# Patient Record
Sex: Male | Born: 1940
Health system: Southern US, Community
[De-identification: ages and names within clinical notes are randomized; demographics above are authoritative.]

## PROBLEM LIST (undated history)

## (undated) DIAGNOSIS — L570 Actinic keratosis: Secondary | ICD-10-CM

## (undated) DIAGNOSIS — E785 Hyperlipidemia, unspecified: Secondary | ICD-10-CM

## (undated) DIAGNOSIS — H269 Unspecified cataract: Secondary | ICD-10-CM

## (undated) DIAGNOSIS — R011 Cardiac murmur, unspecified: Secondary | ICD-10-CM

## (undated) DIAGNOSIS — I493 Ventricular premature depolarization: Secondary | ICD-10-CM

## (undated) DIAGNOSIS — M1990 Unspecified osteoarthritis, unspecified site: Secondary | ICD-10-CM

## (undated) DIAGNOSIS — I10 Essential (primary) hypertension: Secondary | ICD-10-CM

## (undated) DIAGNOSIS — H409 Unspecified glaucoma: Secondary | ICD-10-CM

## (undated) DIAGNOSIS — C4491 Basal cell carcinoma of skin, unspecified: Secondary | ICD-10-CM

## (undated) HISTORY — DX: Unspecified glaucoma: H40.9

## (undated) HISTORY — DX: Actinic keratosis: L57.0

## (undated) HISTORY — DX: Basal cell carcinoma of skin, unspecified: C44.91

## (undated) HISTORY — DX: Hyperlipidemia, unspecified: E78.5

## (undated) HISTORY — DX: Cardiac murmur, unspecified: R01.1

## (undated) HISTORY — DX: Unspecified osteoarthritis, unspecified site: M19.90

## (undated) HISTORY — PX: HERNIA REPAIR: SHX51

## (undated) HISTORY — PX: ROTATOR CUFF REPAIR: SHX139

## (undated) HISTORY — DX: Unspecified cataract: H26.9

---

## 2009-08-13 DIAGNOSIS — D239 Other benign neoplasm of skin, unspecified: Secondary | ICD-10-CM

## 2009-08-13 HISTORY — DX: Other benign neoplasm of skin, unspecified: D23.9

## 2012-06-18 ENCOUNTER — Emergency Department (HOSPITAL_COMMUNITY)
Admission: EM | Admit: 2012-06-18 | Discharge: 2012-06-18 | Disposition: A | Discharge status: Home / Self Care | Payer: Medicare Other | Attending: Emergency Medicine | Admitting: Emergency Medicine

## 2012-06-18 ENCOUNTER — Encounter (HOSPITAL_COMMUNITY): Payer: Self-pay | Admitting: *Deleted

## 2012-06-18 DIAGNOSIS — I10 Essential (primary) hypertension: Secondary | ICD-10-CM | POA: Insufficient documentation

## 2012-06-18 DIAGNOSIS — W261XXA Contact with sword or dagger, initial encounter: Secondary | ICD-10-CM | POA: Insufficient documentation

## 2012-06-18 DIAGNOSIS — S61209A Unspecified open wound of unspecified finger without damage to nail, initial encounter: Secondary | ICD-10-CM | POA: Insufficient documentation

## 2012-06-18 DIAGNOSIS — W260XXA Contact with knife, initial encounter: Secondary | ICD-10-CM | POA: Insufficient documentation

## 2012-06-18 DIAGNOSIS — IMO0002 Reserved for concepts with insufficient information to code with codable children: Secondary | ICD-10-CM

## 2012-06-18 DIAGNOSIS — E119 Type 2 diabetes mellitus without complications: Secondary | ICD-10-CM | POA: Insufficient documentation

## 2012-06-18 DIAGNOSIS — Z23 Encounter for immunization: Secondary | ICD-10-CM | POA: Insufficient documentation

## 2012-06-18 DIAGNOSIS — Z7982 Long term (current) use of aspirin: Secondary | ICD-10-CM | POA: Insufficient documentation

## 2012-06-18 HISTORY — DX: Ventricular premature depolarization: I49.3

## 2012-06-18 HISTORY — DX: Essential (primary) hypertension: I10

## 2012-06-18 MED ORDER — CEPHALEXIN 500 MG PO CAPS: 1000.0000 mg | ORAL_CAPSULE | Freq: Two times a day (BID) | ORAL | Status: DC

## 2012-06-18 MED ORDER — TETANUS-DIPHTH-ACELL PERTUSSIS 5-2.5-18.5 LF-MCG/0.5 IM SUSP
0.5000 mL | Freq: Once | INTRAMUSCULAR | Status: AC
  Administered 2012-06-18: 0.5 mL via INTRAMUSCULAR
  Filled 2012-06-18: qty 0.5

## 2012-06-18 NOTE — ED Notes (Signed)
Pt was opening a curtain rod w/ a pocket knife when he sliced his L thumb, pt states is about 1/2 inch long, states blood was squirting in the air, has been holding pressure on it since, bleeding controlled w/ pressure at this time.

## 2012-06-19 NOTE — ED Provider Notes (Signed)
Medical screening examination/treatment/procedure(s) were performed by non-physician practitioner and as supervising physician I was immediately available for consultation/collaboration.   Celene Kras, MD 06/19/12 [phone]

## 2012-06-19 NOTE — ED Provider Notes (Signed)
History     CSN: [id number]  Arrival date & time 06/18/12  4098   First MD Initiated Contact with Patient 06/18/12 1050      No chief complaint on file.   (Consider location/radiation/quality/duration/timing/severity/associated sxs/prior treatment) HPI  71 y.o. diabetic male INAD c/o laceration to left thumb approximately 2 hours ago after cutting it with a pocket knife. Bleeding is controlled and pain is minimal. Last tetanus unknown.  Past Medical History  Diagnosis Date  . Diabetes mellitus   . PVC (premature ventricular contraction)   . Hypertension     Past Surgical History  Procedure Date  . Hernia repair   . Rotator cuff repair     History reviewed. No pertinent family history.  History  Substance Use Topics  . Smoking status: Never Smoker   . Smokeless tobacco: Former Neurosurgeon  . Alcohol Use: Yes      Review of Systems  Constitutional: Negative for fever.  Respiratory: Negative for shortness of breath.   Cardiovascular: Negative for chest pain.  Gastrointestinal: Negative for nausea, vomiting, abdominal pain and diarrhea.  Skin: Positive for wound.  All other systems reviewed and are negative.    Allergies  Review of patient's allergies indicates no known allergies.  Home Medications   Current Outpatient Rx  Name Route Sig Dispense Refill  . ASPIRIN 325 MG PO TABS Oral Take 325 mg by mouth daily.    Marland Kitchen CINNAMON PO Oral Take 1 capsule by mouth daily.    Marland Kitchen GARLIC PO Oral Take 1 capsule by mouth daily.    Marland Kitchen NIACIN 500 MG PO TABS Oral Take 500 mg by mouth daily with breakfast.    . OLMESARTAN MEDOXOMIL 40 MG PO TABS Oral Take 40 mg by mouth daily.    Marland Kitchen TIMOLOL MALEATE 10 MG PO TABS Oral Take 10 mg by mouth daily.    . CEPHALEXIN 500 MG PO CAPS Oral Take 2 capsules (1,000 mg total) by mouth 2 (two) times daily. 28 capsule 0    BP 137/76  Pulse 49  Temp 97.8 F (36.6 C) (Oral)  Resp 16  SpO2 100%  Physical Exam  Nursing note and vitals  reviewed. Constitutional: He is oriented to person, place, and time. He appears well-developed and well-nourished. No distress.  HENT:  Head: Normocephalic.  Eyes: Conjunctivae normal and EOM are normal.  Cardiovascular: Normal rate.   Pulmonary/Chest: Effort normal. No stridor.  Abdominal: Soft.  Musculoskeletal: Normal range of motion.  Neurological: He is alert and oriented to person, place, and time.  Skin:       3 cm full-thickness laceration to distal phalanx of left thumb. Wound is clean bleeding is controlled. No joint involvement. Full range of motion and distal sensation is intact.  Psychiatric: He has a normal mood and affect.    ED Course  Procedures (including critical care time)  LACERATION REPAIR Performed by: Wynetta Emery Authorized by: Wynetta Emery Consent: Verbal consent obtained. Risks and benefits: risks, benefits and alternatives were discussed Consent given by: patient Patient identity confirmed: provided demographic data Prepped and Draped in normal sterile fashion Wound explored  Laceration Location: left thumb distal phalynx  Laceration Length: 3cm  No Foreign Bodies seen or palpated  Anesthesia: digital block  Local anesthetic: lidocaine 2% without epinephrine  Anesthetic total: 4 ml  Irrigation method: syringe Amount of cleaning: standard  Skin closure: 4-0 polypropylene  Number of sutures: 3  Technique: simple interupted  Patient tolerance: Patient tolerated the procedure well with  no immediate complications.  Labs Reviewed - No data to display No results found.   1. Laceration       MDM  Wound closed with 3x simple interrupted sutures. AS he is diabetic, Abx will be given.   Pt verbalized understanding and agrees with care plan. Outpatient follow-up and return precautions given.     New Prescriptions   CEPHALEXIN (KEFLEX) 500 MG CAPSULE    Take 2 capsules (1,000 mg total) by mouth 2 (two) times daily.           Wynetta Emery, PA-C 06/19/12 [phone]

## 2019-02-06 ENCOUNTER — Other Ambulatory Visit: Payer: Self-pay

## 2019-02-06 ENCOUNTER — Encounter: Payer: Self-pay | Admitting: Physician Assistant

## 2019-02-06 ENCOUNTER — Ambulatory Visit (INDEPENDENT_AMBULATORY_CARE_PROVIDER_SITE_OTHER): Payer: Medicare Other | Admitting: Physician Assistant

## 2019-02-06 VITALS — BP 155/88 | HR 55 | Temp 98.1°F | Resp 16 | Ht 70.0 in | Wt 200.4 lb

## 2019-02-06 DIAGNOSIS — H409 Unspecified glaucoma: Secondary | ICD-10-CM | POA: Insufficient documentation

## 2019-02-06 DIAGNOSIS — I493 Ventricular premature depolarization: Secondary | ICD-10-CM

## 2019-02-06 DIAGNOSIS — R011 Cardiac murmur, unspecified: Secondary | ICD-10-CM | POA: Insufficient documentation

## 2019-02-06 DIAGNOSIS — Z87442 Personal history of urinary calculi: Secondary | ICD-10-CM | POA: Insufficient documentation

## 2019-02-06 DIAGNOSIS — E1136 Type 2 diabetes mellitus with diabetic cataract: Secondary | ICD-10-CM | POA: Diagnosis not present

## 2019-02-06 DIAGNOSIS — E1169 Type 2 diabetes mellitus with other specified complication: Secondary | ICD-10-CM | POA: Insufficient documentation

## 2019-02-06 DIAGNOSIS — I152 Hypertension secondary to endocrine disorders: Secondary | ICD-10-CM | POA: Insufficient documentation

## 2019-02-06 DIAGNOSIS — I10 Essential (primary) hypertension: Secondary | ICD-10-CM | POA: Diagnosis not present

## 2019-02-06 DIAGNOSIS — E119 Type 2 diabetes mellitus without complications: Secondary | ICD-10-CM | POA: Insufficient documentation

## 2019-02-06 NOTE — Progress Notes (Signed)
Patient: Joseph Cook, Male    DOB: 05-11-1941, 78 y.o.   MRN: [id number] Visit Date: 02/06/2019  Today's Provider: Mar Daring, PA-C   Chief Complaint  Patient presents with  . New Patient (Initial Visit)   Subjective:     Establish Care: Joseph Cook is a 78 y.o. male who presents today to establish care. He recently moved here from [place], [place]. He was previously seen at [facility], Maylene Roes, [place].  He reports he is overall healthy. He does have history of HTN and PVCs. He is currently on Metoprolol 50mg  ER daily for PVCs and Lisinopril-HCTZ 20-25mg  for HTN and T2DM. He is diabetic and reports his last A1c was 6.1, 6 months ago. He takes metformin 500mg  daily. He is also on Atorvastatin 10mg  daily.   He reports 2-3 years ago he had kidney stones, requiring lithotripsy. He was placed on tamsulosin 0.4mg  then and has continued due to helping with urination.   There is strong family history of early CAD (multiple relations passed in 76s and 21s, father passed at 35 from MI) on his paternal side.  He does have h/o cataracts and has had surgery for removal. He does report having glaucoma but is stable on OTC eye drops. He is due for annual eye examination in February 2021 and will need to establish locally.  -----------------------------------------------------------------   Review of Systems  Constitutional: Negative.   HENT: Negative.   Eyes: Negative.   Respiratory: Negative.   Cardiovascular: Positive for leg swelling.       PVC'S  Gastrointestinal: Negative.   Endocrine: Negative.   Genitourinary: Negative.   Musculoskeletal: Positive for arthralgias.  Skin: Negative.   Allergic/Immunologic: Negative.   Neurological: Negative.   Hematological: Negative.   Psychiatric/Behavioral: Negative.     Social History      He  reports that he has never smoked. He has quit using smokeless tobacco. He reports current alcohol use. He  reports that he does not use drugs.       Social History   Socioeconomic History  . Marital status: Married    Spouse name: Not on file  . Number of children: Not on file  . Years of education: Not on file  . Highest education level: Not on file  Occupational History  . Not on file  Social Needs  . Financial resource strain: Not on file  . Food insecurity:    Worry: Not on file    Inability: Not on file  . Transportation needs:    Medical: Not on file    Non-medical: Not on file  Tobacco Use  . Smoking status: Never Smoker  . Smokeless tobacco: Former Network engineer and Sexual Activity  . Alcohol use: Yes  . Drug use: No  . Sexual activity: Not on file  Lifestyle  . Physical activity:    Days per week: Not on file    Minutes per session: Not on file  . Stress: Not on file  Relationships  . Social connections:    Talks on phone: Not on file    Gets together: Not on file    Attends religious service: Not on file    Active member of club or organization: Not on file    Attends meetings of clubs or organizations: Not on file    Relationship status: Not on file  Other Topics Concern  . Not on file  Social History Narrative  . Not  on file    Past Medical History:  Diagnosis Date  . Cataract   . Diabetes mellitus   . Glaucoma   . Hypertension   . PVC (premature ventricular contraction)      There are no active problems to display for this patient.   Past Surgical History:  Procedure Laterality Date  . HERNIA REPAIR    . ROTATOR CUFF REPAIR      Family History        Family Status  Relation Name Status  . Mother  (Not Specified)  . Father  (Not Specified)  . Mat Aunt  (Not Specified)  . Mat Uncle  (Not Specified)  . MGM  (Not Specified)  . PGM  (Not Specified)        His family history includes Alzheimer's disease in his maternal aunt; Cancer in his maternal grandmother and paternal grandmother; Heart attack in his father and maternal uncle; Stroke  in his mother.      No Known Allergies   Current Outpatient Medications:  .  aspirin 325 MG tablet, Take 325 mg by mouth daily., Disp: , Rfl:  .  atorvastatin (LIPITOR) 10 MG tablet, Take 10 mg by mouth at bedtime., Disp: , Rfl:  .  Cholecalciferol (VITAMIN D3) 50 MCG (2000 UT) TABS, Take by mouth., Disp: , Rfl:  .  lisinopril-hydrochlorothiazide (ZESTORETIC) 20-25 MG tablet, Take 1 tablet by mouth daily., Disp: , Rfl:  .  metFORMIN (GLUCOPHAGE) 500 MG tablet, Take 500 mg by mouth daily., Disp: , Rfl:  .  METOPROLOL SUCCINATE ER PO, Take 50 mg by mouth daily., Disp: , Rfl:  .  Multiple Vitamins-Minerals (PRESERVISION AREDS 2 PO), Take by mouth., Disp: , Rfl:  .  tamsulosin (FLOMAX) 0.4 MG CAPS capsule, Take 0.4 mg by mouth daily., Disp: , Rfl:    Patient Care Team: Rubye Beach as PCP - General (Family Medicine)    Objective:    Vitals: BP (!) 155/88 (BP Location: Left Arm, Patient Position: Sitting, Cuff Size: Large)   Pulse (!) 55   Temp 98.1 F (36.7 C) (Oral)   Resp 16   Ht 5\' 10"  (1.778 m)   Wt 200 lb 6.4 oz (90.9 kg)   BMI 28.75 kg/m    Vitals:   02/06/19 0921  BP: (!) 155/88  Pulse: (!) 55  Resp: 16  Temp: 98.1 F (36.7 C)  TempSrc: Oral  Weight: 200 lb 6.4 oz (90.9 kg)  Height: 5\' 10"  (1.778 m)     Physical Exam Vitals signs reviewed.  Constitutional:      General: He is not in acute distress.    Appearance: Normal appearance. He is well-developed. He is obese. He is not ill-appearing or diaphoretic.  HENT:     Head: Normocephalic and atraumatic.  Eyes:     General: No scleral icterus.    Extraocular Movements: Extraocular movements intact.  Neck:     Musculoskeletal: Normal range of motion and neck supple.  Cardiovascular:     Rate and Rhythm: Normal rate and regular rhythm.     Pulses: Normal pulses.     Heart sounds: Murmur present. Systolic (heard best over R 2nd ICS) murmur present with a grade of 2/6. No friction rub. No gallop.    Pulmonary:     Effort: Pulmonary effort is normal. No respiratory distress.     Breath sounds: Normal breath sounds. No wheezing or rales.  Neurological:     Mental Status: He is alert.  Depression Screen PHQ 2/9 Scores 02/06/2019  PHQ - 2 Score 0       Assessment & Plan:     Routine Health Maintenance and Physical Exam  Exercise Activities and Dietary recommendations Goals   None     Immunization History  Administered Date(s) Administered  . Tdap 06/18/2012    Health Maintenance  Topic Date Due  . PNA vac Low Risk Adult (1 of 2 - PCV13) 08/06/2006  . INFLUENZA VACCINE  04/26/2019  . TETANUS/TDAP  06/18/2022     Discussed health benefits of physical activity, and encouraged him to engage in regular exercise appropriate for his age and condition.    1. Essential hypertension Stable. Continue current medical treatment plan of lisinopril-hctz 20-25mg  and metoprolol XR 50mg . Will check labs as below and f/u pending results. - CBC with Differential/Platelet - Comprehensive metabolic panel - Hemoglobin A1c - Lipid panel - TSH  2. PVC (premature ventricular contraction) Stable. Has not felt much since being on metoprolol. Will check labs as below and f/u pending results. - CBC with Differential/Platelet - Comprehensive metabolic panel  3. Type 2 diabetes mellitus with diabetic cataract, without long-term current use of insulin (HCC) Stable. Continue metformin 500mg  daily. Will check labs as below and f/u pending results. - CBC with Differential/Platelet - Comprehensive metabolic panel - Hemoglobin A1c - Lipid panel  4. Glaucoma of both eyes, unspecified glaucoma type Continue OTC eye drops. Due for annual eye exam in February 2021.   5. History of kidney stones No issue in last couple years. Takes Tamsulosin 0.4mg  daily.   6. Cardiac murmur Heard today. Suspect aortic stenosis. No previous echocardiogram. No local cardiologist. Patient desires to  monitor for progression or symptoms. Currently denies any symptoms of SOB, DOE, chest pain, abnormal leg swelling.   --------------------------------------------------------------------    Mar Daring, PA-C  [facility]

## 2019-02-06 NOTE — Patient Instructions (Signed)
Type 2 Diabetes Mellitus, Self Care, Adult When you have type 2 diabetes (type 2 diabetes mellitus), you must make sure your blood sugar (glucose) stays in a healthy range. You can do this with:  Nutrition.  Exercise.  Lifestyle changes.  Medicines or insulin, if needed.  Support from your doctors and others. How to stay aware of blood sugar   Check your blood sugar level every day, as often as told.  Have your A1c (hemoglobin A1c) level checked two or more times a year. Have it checked more often if your doctor tells you to. Your doctor will set personal treatment goals for you. Generally, you should have these blood sugar levels:  Before meals (preprandial): 80-130 mg/dL (4.4-7.2 mmol/L).  After meals (postprandial): below 180 mg/dL (10 mmol/L).  A1c level: less than 7%. How to manage high and low blood sugar Signs of high blood sugar High blood sugar is called hyperglycemia. Know the signs of high blood sugar. Signs may include:  Feeling: ? Thirsty. ? Hungry. ? Very tired.  Needing to pee (urinate) more than usual.  Blurry vision. Signs of low blood sugar Low blood sugar is called hypoglycemia. This is when blood sugar is at or below 70 mg/dL (3.9 mmol/L). Signs may include:  Feeling: ? Hungry. ? Worried or nervous (anxious). ? Sweaty and clammy. ? Confused. ? Dizzy. ? Sleepy. ? Sick to your stomach (nauseous).  Having: ? A fast heartbeat. ? A headache. ? A change in your vision. ? Jerky movements that you cannot control (seizure). ? Tingling or no feeling (numbness) around your mouth, lips, or tongue.  Having trouble with: ? Moving (coordination). ? Sleeping. ? Passing out (fainting). ? Getting upset easily (irritability). Treating low blood sugar To treat low blood sugar, eat or drink something sugary right away. If you can think clearly and swallow safely, follow the 15:15 rule:  Take 15 grams of a fast-acting carb (carbohydrate). Talk with your  doctor about how much you should take.  Some fast-acting carbs are: ? Sugar tablets (glucose pills). Take 3-4 pills. ? 6-8 pieces of hard candy. ? 4-6 oz (120-150 mL) of fruit juice. ? 4-6 oz (120-150 mL) of regular (not diet) soda. ? 1 Tbsp (15 mL) honey or sugar.  Check your blood sugar 15 minutes after you take the carb.  If your blood sugar is still at or below 70 mg/dL (3.9 mmol/L), take 15 grams of a carb again.  If your blood sugar does not go above 70 mg/dL (3.9 mmol/L) after 3 tries, get help right away.  After your blood sugar goes back to normal, eat a meal or a snack within 1 hour. Treating very low blood sugar If your blood sugar is at or below 54 mg/dL (3 mmol/L), you have very low blood sugar (severe hypoglycemia). This is an emergency. Do not wait to see if the symptoms will go away. Get medical help right away. Call your local emergency services (911 in the [place] If you have very low blood sugar and you cannot eat or drink, you may need a glucagon shot (injection). A family member or friend should learn how to check your blood sugar and how to give you a glucagon shot. Ask your doctor if you need to have a glucagon shot kit at home. Follow these instructions at home: Medicine  Take insulin and diabetes medicines as told.  If your doctor says you should take more or less insulin and medicines, do this exactly as told.  Do not run out of insulin or medicines. Having diabetes can raise your risk for other long-term conditions. These include heart disease and kidney disease. Your doctor may prescribe medicines to help you not have these problems. Food   Make healthy food choices. These include: ? Chicken, fish, egg whites, and beans. ? Oats, whole wheat, bulgur, brown rice, quinoa, and millet. ? Fresh fruits and vegetables. ? Low-fat dairy products. ? Nuts, avocado, olive oil, and canola oil.  Meet with a food specialist (dietitian). He or she can help you make an  eating plan that is right for you.  Follow instructions from your doctor about what you cannot eat or drink.  Drink enough fluid to keep your pee (urine) pale yellow.  Keep track of carbs that you eat. Do this by reading food labels and learning food serving sizes.  Follow your sick day plan when you cannot eat or drink normally. Make this plan with your doctor so it is ready to use. Activity  Exercise 3 or more times a week.  Do not go more than 2 days without exercising.  Talk with your doctor before you start a new exercise. Your doctor may need to tell you to change: ? How much insulin or medicines you take. ? How much food you eat. Lifestyle  Do not use any tobacco products. These include cigarettes, chewing tobacco, and e-cigarettes. If you need help quitting, ask your doctor.  Ask your doctor how much alcohol is safe for you.  Learn to deal with stress. If you need help with this, ask your doctor. Body care   Stay up to date with your shots (immunizations).  Have your eyes and feet checked by a doctor as often as told.  Check your skin and feet every day. Check for cuts, bruises, redness, blisters, or sores.  Brush your teeth and gums two times a day. Floss one or more times a day.  Go to the dentist one or more times every 6 months.  Stay at a healthy weight. General instructions  Take over-the-counter and prescription medicines only as told by your doctor.  Share your diabetes care plan with: ? Your work or school. ? People you live with.  Carry a card or wear jewelry that says you have diabetes.  Keep all follow-up visits as told by your doctor. This is important. Questions to ask your doctor  Do I need to meet with a diabetes educator?  Where can I find a support group for people with diabetes? Where to find more information To learn more about diabetes, visit:  American Diabetes Association: www.diabetes.org  American Association of Diabetes  Educators: www.diabeteseducator.org Summary  When you have type 2 diabetes, you must make sure your blood sugar (glucose) stays in a healthy range.  Check your blood sugar every day, as often as told.  Having diabetes can raise your risk for other conditions. Your doctor may prescribe medicines to help you not have these problems.  Keep all follow-up visits as told by your doctor. This is important. This information is not intended to replace advice given to you by your health care provider. Make sure you discuss any questions you have with your health care provider. Document Released: 01/03/2016 Document Revised: 03/04/2018 Document Reviewed: 10/15/2015 Elsevier Interactive Patient Education  2019 Elsevier Inc.  

## 2019-02-07 ENCOUNTER — Telehealth: Payer: Self-pay

## 2019-02-07 LAB — LIPID PANEL
Chol/HDL Ratio: 3 ratio (ref 0.0–5.0)
Cholesterol, Total: 122 mg/dL (ref 100–199)
HDL: 41 mg/dL (ref >39)
LDL Calculated: 61 mg/dL (ref 0–99)
Triglycerides: 100 mg/dL (ref 0–149)
VLDL Cholesterol Cal: 20 mg/dL (ref 5–40)

## 2019-02-07 LAB — COMPREHENSIVE METABOLIC PANEL
ALT: 16 IU/L (ref 0–44)
AST: 14 IU/L (ref 0–40)
Albumin/Globulin Ratio: 2.5 — ABNORMAL HIGH (ref 1.2–2.2)
Albumin: 4.9 g/dL — ABNORMAL HIGH (ref 3.7–4.7)
Alkaline Phosphatase: 48 IU/L (ref 39–117)
BUN/Creatinine Ratio: 32 — ABNORMAL HIGH (ref 10–24)
BUN: 27 mg/dL (ref 8–27)
Bilirubin Total: 0.6 mg/dL (ref 0.0–1.2)
CO2: 20 mmol/L (ref 20–29)
Calcium: 9.9 mg/dL (ref 8.6–10.2)
Chloride: 102 mmol/L (ref 96–106)
Creatinine, Ser: 0.84 mg/dL (ref 0.76–1.27)
GFR calc Af Amer: 98 mL/min/{1.73_m2} (ref >59)
GFR calc non Af Amer: 84 mL/min/{1.73_m2} (ref >59)
Globulin, Total: 2 g/dL (ref 1.5–4.5)
Glucose: 130 mg/dL — ABNORMAL HIGH (ref 65–99)
Potassium: 4.1 mmol/L (ref 3.5–5.2)
Sodium: 141 mmol/L (ref 134–144)
Total Protein: 6.9 g/dL (ref 6.0–8.5)

## 2019-02-07 LAB — CBC WITH DIFFERENTIAL/PLATELET
Basophils Absolute: 0 10*3/uL (ref 0.0–0.2)
Basos: 0 %
EOS (ABSOLUTE): 0.2 10*3/uL (ref 0.0–0.4)
Eos: 3 %
Hematocrit: 43.1 % (ref 37.5–51.0)
Hemoglobin: 15.2 g/dL (ref 13.0–17.7)
Immature Grans (Abs): 0 10*3/uL (ref 0.0–0.1)
Immature Granulocytes: 0 %
Lymphocytes Absolute: 2.2 10*3/uL (ref 0.7–3.1)
Lymphs: 31 %
MCH: 30.6 pg (ref 26.6–33.0)
MCHC: 35.3 g/dL (ref 31.5–35.7)
MCV: 87 fL (ref 79–97)
Monocytes Absolute: 0.5 10*3/uL (ref 0.1–0.9)
Monocytes: 7 %
Neutrophils Absolute: 4.2 10*3/uL (ref 1.4–7.0)
Neutrophils: 59 %
Platelets: 173 10*3/uL (ref 150–450)
RBC: 4.96 x10E6/uL (ref 4.14–5.80)
RDW: 13.2 % (ref 11.6–15.4)
WBC: 7.1 10*3/uL (ref 3.4–10.8)

## 2019-02-07 LAB — HEMOGLOBIN A1C
Est. average glucose Bld gHb Est-mCnc: 131 mg/dL
Hgb A1c MFr Bld: 6.2 % — ABNORMAL HIGH (ref 4.8–5.6)

## 2019-02-07 LAB — TSH: TSH: 2.36 u[IU]/mL (ref 0.450–4.500)

## 2019-02-07 NOTE — Telephone Encounter (Signed)
Lab results given to patient.

## 2019-02-07 NOTE — Telephone Encounter (Signed)
-----   Message from Mar Daring, [place] sent at 02/07/2019  9:20 AM EDT ----- Blood count is normal. Kidney and liver function is normal. A1c is at 6.2. Cholesterol is normal. Thyroid is normal.

## 2019-04-02 NOTE — Progress Notes (Signed)
Patient: Joseph Cook Male    DOB: 01-Jul-1941   78 y.o.   MRN: [id number] Visit Date: 04/03/2019  Today's Provider: Mar Daring, PA-C   Chief Complaint  Patient presents with  . Back Pain    Left side   Subjective:     Back Pain This is a new problem. The current episode started in the past 7 days. The problem has been gradually improving since onset. The pain is present in the lumbar spine. Pertinent negatives include no abdominal pain, bladder incontinence, bowel incontinence, chest pain, dysuria, fever, headaches, leg pain, numbness, paresthesias, tingling or weakness.   Reports he was playing golf and felt his back start tightening up as he was playing and made difficult to walk the last 7-8 holes. Has been trying IBU without relief. Pain is in right lower back and radiates through the right gluteal region.   No Known Allergies   Current Outpatient Medications:  .  aspirin 325 MG tablet, Take 325 mg by mouth daily., Disp: , Rfl:  .  atorvastatin (LIPITOR) 10 MG tablet, Take 10 mg by mouth at bedtime., Disp: , Rfl:  .  Cholecalciferol (VITAMIN D3) 50 MCG (2000 UT) TABS, Take by mouth., Disp: , Rfl:  .  lisinopril-hydrochlorothiazide (ZESTORETIC) 20-25 MG tablet, Take 1 tablet by mouth daily., Disp: , Rfl:  .  metFORMIN (GLUCOPHAGE) 500 MG tablet, Take 500 mg by mouth daily., Disp: , Rfl:  .  METOPROLOL SUCCINATE ER PO, Take 50 mg by mouth daily., Disp: , Rfl:  .  Multiple Vitamins-Minerals (PRESERVISION AREDS 2 PO), Take by mouth., Disp: , Rfl:  .  tamsulosin (FLOMAX) 0.4 MG CAPS capsule, Take 0.4 mg by mouth daily., Disp: , Rfl:   Review of Systems  Constitutional: Negative.  Negative for appetite change, chills and fever.  Respiratory: Negative.  Negative for chest tightness, shortness of breath and wheezing.   Cardiovascular: Negative for chest pain and palpitations.  Gastrointestinal: Negative.  Negative for abdominal pain, bowel incontinence, nausea and  vomiting.  Genitourinary: Negative.  Negative for bladder incontinence and dysuria.  Musculoskeletal: Positive for back pain and myalgias. Negative for arthralgias, gait problem, joint swelling, neck pain and neck stiffness.       Pt complaining of left foot pain mostly at night.  Pt also states he has muscle pain on his right buttocks.    Neurological: Negative for dizziness, tingling, weakness, light-headedness, numbness, headaches and paresthesias.    Social History   Tobacco Use  . Smoking status: Never Smoker  . Smokeless tobacco: Former Network engineer Use Topics  . Alcohol use: Yes      Objective:   BP (!) 159/81 (BP Location: Left Arm, Patient Position: Sitting, Cuff Size: Normal)   Pulse (!) 54   Temp 97.7 F (36.5 C) (Oral)   Wt 204 lb (92.5 kg)   BMI 29.27 kg/m  Vitals:   04/03/19 0825  BP: (!) 159/81  Pulse: (!) 54  Temp: 97.7 F (36.5 C)  TempSrc: Oral  Weight: 204 lb (92.5 kg)     Physical Exam Vitals signs reviewed.  Constitutional:      General: He is not in acute distress.    Appearance: Normal appearance. He is well-developed. He is obese. He is not ill-appearing or diaphoretic.  HENT:     Head: Normocephalic and atraumatic.  Neck:     Musculoskeletal: Normal range of motion and neck supple.  Cardiovascular:     Rate  and Rhythm: Normal rate and regular rhythm.     Heart sounds: Normal heart sounds. No murmur. No friction rub. No gallop.   Pulmonary:     Effort: Pulmonary effort is normal. No respiratory distress.     Breath sounds: Normal breath sounds. No wheezing or rales.  Neurological:     Mental Status: He is alert.     Narrative & Impression    CLINICAL DATA:  Acute left-sided low back pain without sciatica. No known injury.  EXAM: LUMBAR SPINE - COMPLETE 4+ VIEW  COMPARISON:  None.  FINDINGS: Minimal grade 1 retrolisthesis of L3-4 is noted secondary to moderate degenerative disc disease at this level. Mild degenerative  disc disease is noted at L1-2 and L2-3 with anterior osteophyte formation. Atherosclerosis of abdominal aorta is noted. No fracture is noted.  IMPRESSION: Multilevel degenerative disc disease. No acute abnormality seen in the lumbar spine.  Aortic Atherosclerosis (ICD10-I70.0).   Electronically Signed   By: Marijo Conception M.D.   On: 04/03/2019 12:13     No results found for any visits on 04/03/19.     Assessment & Plan    1. Acute left-sided low back pain without sciatica Xray obtained to r/o bony abnormality. Advised to use NSAIDs, heat, light stretches, massage (trigger point therapy demonstrated). Xray shows DDD throughout. Call if worsening.  - DG Lumbar Spine Complete; Future  2. Pure hypercholesterolemia Stable. Diagnosis pulled for medication refill. Continue current medical treatment plan. - atorvastatin (LIPITOR) 10 MG tablet; Take 1 tablet (10 mg total) by mouth at bedtime.  Dispense: 90 tablet; Refill: [address], PA-C  [facility]

## 2019-04-03 ENCOUNTER — Encounter: Payer: Self-pay | Admitting: Physician Assistant

## 2019-04-03 ENCOUNTER — Ambulatory Visit
Admission: RE | Admit: 2019-04-03 | Discharge: 2019-04-03 | Disposition: A | Discharge status: Home / Self Care | Payer: Medicare Other | Source: Ambulatory Visit | Attending: Physician Assistant | Admitting: Physician Assistant

## 2019-04-03 ENCOUNTER — Ambulatory Visit (INDEPENDENT_AMBULATORY_CARE_PROVIDER_SITE_OTHER): Payer: Medicare Other | Admitting: Physician Assistant

## 2019-04-03 ENCOUNTER — Other Ambulatory Visit: Payer: Self-pay

## 2019-04-03 ENCOUNTER — Telehealth: Payer: Self-pay | Admitting: *Deleted

## 2019-04-03 ENCOUNTER — Ambulatory Visit
Admission: RE | Admit: 2019-04-03 | Discharge: 2019-04-03 | Disposition: A | Discharge status: Home / Self Care | Payer: Medicare Other | Attending: Physician Assistant | Admitting: Physician Assistant

## 2019-04-03 VITALS — BP 159/81 | HR 54 | Temp 97.7°F | Wt 204.0 lb

## 2019-04-03 DIAGNOSIS — E7800 Pure hypercholesterolemia, unspecified: Secondary | ICD-10-CM

## 2019-04-03 DIAGNOSIS — M545 Low back pain, unspecified: Secondary | ICD-10-CM

## 2019-04-03 MED ORDER — ATORVASTATIN CALCIUM 10 MG PO TABS: 10.0000 mg | ORAL_TABLET | Freq: Every day | ORAL | 1 refills | Status: DC

## 2019-04-03 NOTE — Patient Instructions (Signed)

## 2019-04-03 NOTE — Telephone Encounter (Signed)
Patient was notified of results. Expressed understanding. Patient is agreeable to referral to ortho.

## 2019-04-03 NOTE — Telephone Encounter (Signed)
-----   Message from Mar Daring, [place] sent at 04/03/2019  1:04 PM EDT ----- There is arthritic changes noted throughout. Also hardening of the aorta is noted. Controlling BP and cholesterol will help with that so make sure to keep taking atorvastatin. I do still think that there is inflammation involving the SI joint on the left even though it was not noted here. Would recommend orthopedic referral for consideration of treatment options.

## 2019-05-27 ENCOUNTER — Other Ambulatory Visit: Payer: Self-pay | Admitting: Physician Assistant

## 2019-05-27 MED ORDER — TAMSULOSIN HCL 0.4 MG PO CAPS: 0.4000 mg | ORAL_CAPSULE | Freq: Every day | ORAL | 1 refills | Status: DC

## 2019-05-27 NOTE — Telephone Encounter (Signed)
Pt needing a refill on:  tamsulosin (FLOMAX) 0.4 MG CAPS capsule   Please fill at:        [id number] [place], [place] - [address] [phone] (Phone) [phone] (Fax)   Thanks, [place]

## 2019-07-07 ENCOUNTER — Telehealth: Payer: Self-pay

## 2019-07-07 DIAGNOSIS — I10 Essential (primary) hypertension: Secondary | ICD-10-CM

## 2019-07-07 MED ORDER — METOPROLOL SUCCINATE ER 50 MG PO TB24: 50.0000 mg | ORAL_TABLET | Freq: Every day | ORAL | 3 refills | Status: DC

## 2019-07-07 NOTE — Telephone Encounter (Signed)
refilled 

## 2019-07-07 NOTE — Telephone Encounter (Signed)
Patient requesting for refill request on the following medication METOPROLOL SUCCINATE ER PO   [facility] [place]

## 2019-08-08 NOTE — Progress Notes (Signed)
I, Bretlyn Ward ,CMA am acting as a scribe for [employer] PA-C    Patient: Joseph Cook Male    DOB: [birth year]   78 y.o.   MRN: [id number] Visit Date: 08/11/2019  Today's Provider: Mar Daring, PA-C   Chief Complaint  Patient presents with   Follow-up   Diabetes   Subjective:     HPI   Diabetes Mellitus Type II, Follow-up:   Lab Results  Component Value Date   HGBA1C 6.6 (A) 08/11/2019   HGBA1C 6.2 (H) 02/06/2019    Last seen for diabetes 6 months ago.  Management since then includes Stable. Continue metformin 500mg  daily. He reports excellent compliance with treatment. He is not having side effects.  Current symptoms include weight loss and have been stable. Home blood sugar records: fasting range: 110  Episodes of hypoglycemia? no   Current insulin regiment: Is not on insulin Most Recent Eye Exam: Has an appointment 08/12/2019  Weight trend: stable Prior visit with dietician: No Current exercise: walking Current diet habits: well balanced  Pertinent Labs:    Component Value Date/Time   CHOL 122 02/06/2019 1024   TRIG 100 02/06/2019 1024   HDL 41 02/06/2019 1024   LDLCALC 61 02/06/2019 1024   CREATININE 0.84 02/06/2019 1024    Wt Readings from Last 3 Encounters:  08/11/19 207 lb (93.9 kg)  04/03/19 204 lb (92.5 kg)  02/06/19 200 lb 6.4 oz (90.9 kg)   ------------------------------------------------------------------------   No Known Allergies   Current Outpatient Medications:    aspirin 325 MG tablet, Take 325 mg by mouth daily., Disp: , Rfl:    atorvastatin (LIPITOR) 10 MG tablet, Take 1 tablet (10 mg total) by mouth at bedtime., Disp: 90 tablet, Rfl: 1   Cholecalciferol (VITAMIN D3) 50 MCG (2000 UT) TABS, Take by mouth., Disp: , Rfl:    lisinopril-hydrochlorothiazide (ZESTORETIC) 20-25 MG tablet, Take 1 tablet by mouth daily., Disp: , Rfl:    metFORMIN (GLUCOPHAGE) 500 MG tablet, Take 500 mg by mouth daily., Disp: ,  Rfl:    metoprolol succinate (TOPROL-XL) 50 MG 24 hr tablet, Take 1 tablet (50 mg total) by mouth daily., Disp: 90 tablet, Rfl: 3   Multiple Vitamins-Minerals (PRESERVISION AREDS 2 PO), Take by mouth., Disp: , Rfl:    tamsulosin (FLOMAX) 0.4 MG CAPS capsule, Take 1 capsule (0.4 mg total) by mouth daily., Disp: 90 capsule, Rfl: 1  Review of Systems  Constitutional: Negative.   Respiratory: Negative.   Cardiovascular: Negative.   Gastrointestinal: Negative.   Endocrine: Negative for polydipsia, polyphagia and polyuria.  Musculoskeletal: Positive for arthralgias (left foot).  Neurological: Negative.   Psychiatric/Behavioral: Negative.     Social History   Tobacco Use   Smoking status: Never Smoker   Smokeless tobacco: Former Network engineer Use Topics   Alcohol use: Yes      Objective:   BP 139/79    Pulse 60    Temp (!) 96.9 F (36.1 C) (Temporal)    Resp 16    Ht 5\' 10"  (1.778 m)    Wt 207 lb (93.9 kg)    BMI 29.70 kg/m  Vitals:   08/11/19 0917  BP: 139/79  Pulse: 60  Resp: 16  Temp: (!) 96.9 F (36.1 C)  TempSrc: Temporal  Weight: 207 lb (93.9 kg)  Height: 5\' 10"  (1.778 m)  Body mass index is 29.7 kg/m.   Physical Exam Vitals signs reviewed.  Constitutional:      General: He is  not in acute distress.    Appearance: Normal appearance. He is well-developed. He is obese. He is not ill-appearing or diaphoretic.  HENT:     Head: Normocephalic and atraumatic.     Right Ear: Ear canal and external ear normal. There is impacted cerumen.     Left Ear: Ear canal and external ear normal. There is impacted cerumen.     Nose: Nose normal.  Eyes:     General: No scleral icterus.    Extraocular Movements: Extraocular movements intact.     Pupils: Pupils are equal, round, and reactive to light.  Neck:     Musculoskeletal: Normal range of motion and neck supple.     Thyroid: No thyromegaly.     Vascular: No JVD.     Trachea: No tracheal deviation.  Cardiovascular:       Rate and Rhythm: Normal rate and regular rhythm.     Pulses: Normal pulses.     Heart sounds: Normal heart sounds. No murmur. No friction rub. No gallop.   Pulmonary:     Effort: Pulmonary effort is normal. No respiratory distress.     Breath sounds: Normal breath sounds. No wheezing or rales.  Musculoskeletal:     Right lower leg: Edema (1+ pitting edema in right leg) present.     Left lower leg: No edema.  Lymphadenopathy:     Cervical: No cervical adenopathy.  Skin:    Capillary Refill: Capillary refill takes less than 2 seconds.  Neurological:     Mental Status: He is alert.      Results for orders placed or performed in visit on 08/11/19  POCT HgB A1C  Result Value Ref Range   Hemoglobin A1C 6.6 (A) 4.0 - 5.6 %   Est. average glucose Bld gHb Est-mCnc 143        Assessment & Plan    1. Type 2 diabetes mellitus with diabetic cataract, without long-term current use of insulin (HCC) A1c up slightly compared to last check at 6.6, was 6.2. Will continue Metformin 500mg  daily. Discussed lifestyle modifications and limiting carbs and sugars. I will see him back in 6 months for his CPE and we will recheck then.   2. Left foot pain New. Reports over the last few months he is awoken in the middle of the night with sharp, stabbing pain in the dorsal aspect of the midfoot. He reports during the day that he does noticed this ache in the same area, but it is not persistent nor limiting. Will get imaging as below to see if OA vs stress fracture vs other bony abnormality.  - DG Foot Complete Left; Future  3. Bilateral impacted cerumen Bilateral. Lavage successful. Will recheck in 6 months and may repeat unless needed sooner.  - Ear Lavage  4. BMI 29.0-29.9,adult Walks and plays golf. Counseled patient on healthy lifestyle modifications including dieting and exercise.      Mar Daring, PA-C  [facility]

## 2019-08-11 ENCOUNTER — Ambulatory Visit
Admission: RE | Admit: 2019-08-11 | Discharge: 2019-08-11 | Disposition: A | Discharge status: Home / Self Care | Payer: Medicare Other | Source: Ambulatory Visit | Attending: Physician Assistant | Admitting: Physician Assistant

## 2019-08-11 ENCOUNTER — Other Ambulatory Visit: Payer: Self-pay

## 2019-08-11 ENCOUNTER — Ambulatory Visit (INDEPENDENT_AMBULATORY_CARE_PROVIDER_SITE_OTHER): Payer: Medicare Other | Admitting: Physician Assistant

## 2019-08-11 ENCOUNTER — Encounter: Payer: Self-pay | Admitting: Physician Assistant

## 2019-08-11 VITALS — BP 139/79 | HR 60 | Temp 96.9°F | Resp 16 | Ht 70.0 in | Wt 207.0 lb

## 2019-08-11 DIAGNOSIS — E1136 Type 2 diabetes mellitus with diabetic cataract: Secondary | ICD-10-CM

## 2019-08-11 DIAGNOSIS — M79672 Pain in left foot: Secondary | ICD-10-CM

## 2019-08-11 DIAGNOSIS — H6123 Impacted cerumen, bilateral: Secondary | ICD-10-CM | POA: Diagnosis not present

## 2019-08-11 DIAGNOSIS — Z6829 Body mass index (BMI) 29.0-29.9, adult: Secondary | ICD-10-CM | POA: Diagnosis not present

## 2019-08-11 LAB — POCT GLYCOSYLATED HEMOGLOBIN (HGB A1C)
Est. average glucose Bld gHb Est-mCnc: 143
Hemoglobin A1C: 6.6 % — AB (ref 4.0–5.6)

## 2019-08-11 NOTE — Patient Instructions (Signed)
SGLT2-Inhibitors:  Jardiance  Farxiga  Invokana  Steglatro  GLP-1 Inhibitor:  Trulicity  Ozempic  Bydureon (Bcise)  Victoza (daily)  Rybelsus (oral)

## 2019-08-12 ENCOUNTER — Telehealth: Payer: Self-pay

## 2019-08-12 NOTE — Telephone Encounter (Signed)
-----   Message from Mar Daring, [place] sent at 08/11/2019  5:10 PM EST ----- Foot xray is normal. The joint spaces appear preserved so no arthritic changes. There is some vascular calcifications noted. If desired I can refer to vascular surgery for ABIs (this is a baseline test to determine if you have peripheral arterial disease).

## 2019-08-12 NOTE — Telephone Encounter (Signed)
No answer/not able to LM

## 2019-08-13 LAB — HM DIABETES EYE EXAM

## 2019-08-14 ENCOUNTER — Encounter: Payer: Self-pay | Admitting: Physician Assistant

## 2019-08-15 NOTE — Telephone Encounter (Signed)
NA unable to LM-If patient calls back -Ok for [facility] to give results

## 2019-08-18 ENCOUNTER — Telehealth: Payer: Self-pay | Admitting: Physician Assistant

## 2019-08-18 NOTE — Telephone Encounter (Signed)
LMTCB 08/18/2019   Thanks,   -Laura  

## 2019-08-18 NOTE — Telephone Encounter (Signed)
From [facility] 

## 2019-08-18 NOTE — Telephone Encounter (Signed)
Patient called and he was read note per Baldomero Lamy PA 08/11/2019. He verbalized understanding but does not want a referral at this time. He verbalized understanding of all information.

## 2019-09-22 ENCOUNTER — Other Ambulatory Visit: Payer: Self-pay | Admitting: Physician Assistant

## 2019-09-22 NOTE — Telephone Encounter (Signed)
Requested medication (s) are due for refill today: yes  Requested medication (s) are on the active medication list: yes  Last refill:  06/24/2019  Future visit scheduled: yes  Notes to clinic: last refilled by historical provider  Review for refill   Requested Prescriptions  Pending Prescriptions Disp Refills   lisinopril-hydrochlorothiazide (ZESTORETIC) 20-25 MG tablet [Pharmacy Med Name: LISINOPRIL-HCTZ 20-25 MG TAB] 90 tablet 1    Sig: TAKE ONE TABLET BY MOUTH EVERY DAY FOR BLOOD PRESSURE AND KIDNEY PROTECTION      Cardiovascular:  ACEI + Diuretic Combos Failed - 09/22/2019  2:33 PM      Failed - Na in normal range and within 180 days    Sodium  Date Value Ref Range Status  02/06/2019 141 134 - 144 mmol/L Final          Failed - K in normal range and within 180 days    Potassium  Date Value Ref Range Status  02/06/2019 4.1 3.5 - 5.2 mmol/L Final          Failed - Cr in normal range and within 180 days    Creatinine, Ser  Date Value Ref Range Status  02/06/2019 0.84 0.76 - 1.27 mg/dL Final          Failed - Ca in normal range and within 180 days    Calcium  Date Value Ref Range Status  02/06/2019 9.9 8.6 - 10.2 mg/dL Final          Passed - Patient is not pregnant      Passed - Last BP in normal range    BP Readings from Last 1 Encounters:  08/11/19 139/79          Passed - Valid encounter within last 6 months    Recent Outpatient Visits           1 month ago Type 2 diabetes mellitus with diabetic cataract, without long-term current use of insulin [facility])   [place], [place], [place]   5 months ago Acute left-sided low back pain without sciatica   [place], Clearnce Sorrel, [place]   7 months ago Essential hypertension   [place], Clearnce Sorrel, [place]       Future Appointments             In 4 months [place], Clearnce Sorrel, PA-C [employer], [facility]

## 2019-10-03 ENCOUNTER — Ambulatory Visit: Payer: Medicare Other | Admitting: Physician Assistant

## 2019-10-06 NOTE — Progress Notes (Signed)
Patient: Joseph Cook Male    DOB: 1940/12/08   79 y.o.   MRN: [id number] Visit Date: 10/09/2019  Today's Provider: Mar Daring, PA-C   Chief Complaint  Patient presents with  . Dysphagia    started about a month ago.     Subjective:     Virtual Visit via Telephone Note  I connected with Joseph Cook on 10/09/19 at 10:40 AM EST by telephone and verified that I am speaking with the correct person using two identifiers.  Location: Patient: Home Provider: [facility]   I discussed the limitations, risks, security and privacy concerns of performing an evaluation and management service by telephone and the availability of in person appointments. I also discussed with the patient that there may be a patient responsible charge related to this service. The patient expressed understanding and agreed to proceed.  HPI   Pt reports having trouble with swallowing for about a month.  He says "it feels like there is a lump at the base of my throat"  He denies heartburn, coughing, shortness of breath. He does complain of occasional congestion, and postnasal drip.    He also reports a remote history of choking on foods "occasionally" over the last 2-3 years that has progressively been worsening. Noticed with meats initially, now occurring with other foods as well.    No Known Allergies   Current Outpatient Medications:  .  aspirin 325 MG tablet, Take 325 mg by mouth daily., Disp: , Rfl:  .  Cholecalciferol (VITAMIN D3) 50 MCG (2000 UT) TABS, Take by mouth., Disp: , Rfl:  .  lisinopril-hydrochlorothiazide (ZESTORETIC) 20-25 MG tablet, TAKE ONE TABLET BY MOUTH EVERY DAY FOR BLOOD PRESSURE AND KIDNEY PROTECTION, Disp: 90 tablet, Rfl: 1 .  metFORMIN (GLUCOPHAGE) 500 MG tablet, Take 500 mg by mouth daily., Disp: , Rfl:  .  metoprolol succinate (TOPROL-XL) 50 MG 24 hr tablet, Take 1 tablet (50 mg total) by mouth daily., Disp: 90 tablet, Rfl: 3 .  Multiple Vitamins-Minerals  (PRESERVISION AREDS 2 PO), Take by mouth., Disp: , Rfl:  .  tamsulosin (FLOMAX) 0.4 MG CAPS capsule, Take 1 capsule (0.4 mg total) by mouth daily., Disp: 90 capsule, Rfl: 1 .  atorvastatin (LIPITOR) 10 MG tablet, Take 1 tablet (10 mg total) by mouth at bedtime. (Patient not taking: Reported on 10/09/2019), Disp: 90 tablet, Rfl: 1 .  terbinafine (LAMISIL) 250 MG tablet, Take 250 mg by mouth daily., Disp: , Rfl:   Review of Systems  Constitutional: Negative.   HENT: Positive for congestion, postnasal drip and trouble swallowing. Negative for rhinorrhea, sore throat, tinnitus and voice change.   Respiratory: Negative.   Gastrointestinal: Negative.   Neurological: Negative for dizziness, light-headedness and headaches.    Social History   Tobacco Use  . Smoking status: Never Smoker  . Smokeless tobacco: Former Network engineer Use Topics  . Alcohol use: Yes      Objective:   There were no vitals taken for this visit. There were no vitals filed for this visit.There is no height or weight on file to calculate BMI.   Physical Exam Vitals reviewed.  Constitutional:      General: He is not in acute distress. Pulmonary:     Effort: No respiratory distress.  Neurological:     Mental Status: He is alert.      No results found for any visits on 10/09/19.     Assessment & Plan     1.  Gastroesophageal reflux disease without esophagitis Suspect this is a cause of his dysphagia and the globus sensation. Will start omeprazole as below. He is to call if not improving and will refer to GI.   2. Globus sensation See above medical treatment plan. - omeprazole (PRILOSEC) 40 MG capsule; Take 1 capsule (40 mg total) by mouth daily.  Dispense: 30 capsule; Refill: 3 - DG SWALLOW STUDY OP MEDICARE SPEECH PATH; Future  3. Dysphagia, unspecified type Also has remote, yet progressive, history of dysphagia and choking on foods. Will order swallowing study as below. Will f/u pending results.  -  DG SWALLOW STUDY OP MEDICARE SPEECH PATH; Future  4. Choking sensation See above medical treatment plan. - DG SWALLOW STUDY OP MEDICARE SPEECH PATH; Future   I discussed the assessment and treatment plan with the patient. The patient was provided an opportunity to ask questions and all were answered. The patient agreed with the plan and demonstrated an understanding of the instructions.   The patient was advised to call back or seek an in-person evaluation if the symptoms worsen or if the condition fails to improve as anticipated.  I provided 14 minutes of non-face-to-face time during this encounter.    Mar Daring, PA-C  [facility]

## 2019-10-09 ENCOUNTER — Encounter: Payer: Self-pay | Admitting: Physician Assistant

## 2019-10-09 ENCOUNTER — Ambulatory Visit (INDEPENDENT_AMBULATORY_CARE_PROVIDER_SITE_OTHER): Payer: Medicare Other | Admitting: Physician Assistant

## 2019-10-09 DIAGNOSIS — R0989 Other specified symptoms and signs involving the circulatory and respiratory systems: Secondary | ICD-10-CM | POA: Diagnosis not present

## 2019-10-09 DIAGNOSIS — R1310 Dysphagia, unspecified: Secondary | ICD-10-CM

## 2019-10-09 DIAGNOSIS — K219 Gastro-esophageal reflux disease without esophagitis: Secondary | ICD-10-CM | POA: Diagnosis not present

## 2019-10-09 MED ORDER — OMEPRAZOLE 40 MG PO CPDR: 40.0000 mg | DELAYED_RELEASE_CAPSULE | Freq: Every day | ORAL | 3 refills | Status: DC

## 2019-10-16 ENCOUNTER — Other Ambulatory Visit: Payer: Self-pay | Admitting: Internal Medicine

## 2019-10-30 ENCOUNTER — Ambulatory Visit
Admission: RE | Admit: 2019-10-30 | Discharge: 2019-10-30 | Disposition: A | Discharge status: Home / Self Care | Payer: Medicare Other | Source: Ambulatory Visit | Attending: Physician Assistant | Admitting: Physician Assistant

## 2019-10-30 ENCOUNTER — Other Ambulatory Visit: Payer: Self-pay

## 2019-10-30 DIAGNOSIS — R0989 Other specified symptoms and signs involving the circulatory and respiratory systems: Secondary | ICD-10-CM | POA: Diagnosis not present

## 2019-10-30 DIAGNOSIS — R1314 Dysphagia, pharyngoesophageal phase: Secondary | ICD-10-CM | POA: Diagnosis present

## 2019-10-30 DIAGNOSIS — R1310 Dysphagia, unspecified: Secondary | ICD-10-CM | POA: Diagnosis present

## 2019-10-30 NOTE — Therapy (Addendum)
[place], [place], [address] Phone: [phone]   Fax:     Modified Barium Swallow  Patient Details  Name: Joseph Cook MRN: [id number] Date of Birth: 01-Sep-1941 No data recorded  Encounter Date: 10/30/2019  End of Session - 10/30/19 1616    Visit Number  1    Number of Visits  1    Date for SLP Re-Evaluation  10/30/19    SLP Start Time  42    SLP Stop Time   1400    SLP Time Calculation (min)  60 min    Activity Tolerance  Patient tolerated treatment well       Past Medical History:  Diagnosis Date  . Cataract   . Diabetes mellitus   . Glaucoma   . Hypertension   . PVC (premature ventricular contraction)     Past Surgical History:  Procedure Laterality Date  . HERNIA REPAIR    . ROTATOR CUFF REPAIR      There were no vitals filed for this visit.      Subjective: Patient behavior: (alertness, ability to follow instructions, etc.): pt verbally engaging; followed instructions. Pt described and endorsed Globus feelings intermittently; on a PPI 1x daily. Pt reported No weight loss, or change in diet. Pt reported No neurological history or pulmonary decline in past 1 year.  Chief complaint: dysphagia   Objective:  Radiological Procedure: A videoflouroscopic evaluation of oral-preparatory, reflex initiation, and pharyngeal phases of the swallow was performed; as well as a screening of the upper esophageal phase.  I. POSTURE: upright II. VIEW: lateral III. COMPENSATORY STRATEGIES: f/u, Dry swallow to clear any (intermittently) remaining pyriform sinus residue(min) IV. BOLUSES ADMINISTERED:  Thin Liquid: 6 trials  Nectar-thick Liquid: 2 trials  Honey-thick Liquid: NT  Puree: 2 trials  Mechanical Soft: 3 trials V. RESULTS OF EVALUATION: A. ORAL PREPARATORY PHASE: (The lips, tongue, and velum are observed for strength and coordination)       **Overall Severity Rating: WFL.    B. SWALLOW INITIATION/REFLEX: (The reflex is normal if "triggered" by the time the bolus reached the base of the tongue)  **Overall Severity Rating: [facility].   C. PHARYNGEAL PHASE: (Pharyngeal function is normal if the bolus shows rapid, smooth, and continuous transit through the pharynx and there is no pharyngeal residue after the swallow)  **Overall Severity Rating: grossly WFL. Slight-min retention of bolus residue of increased texture trials(puree, soft solids) - this was intermittent. Bolus residue remaining in the Pyriform Sinuses post initial swallow cleared w/ a f/u, DRY swallow. No other pharyngeal residue noted.  D. LARYNGEAL PENETRATION: (Material entering into the laryngeal inlet/vestibule but not aspirated): NONE E. ASPIRATION: NONE F. ESOPHAGEAL PHASE: (Screening of the upper esophagus): appearance of slightly prominent Cricopharyngeus muscle noted during the swallow, moreso w/ increased texture foods   ASSESSMENT: Pt appears to present w/ adequate oropharyngeal phase swallow function w/ No gross oropharyngeal phase dysphagia present during this study today. NO laryngeal penetration or aspiration occurred during this study. During the Esophageal phase of swallowing, there was the appearance of a slightly prominent Cricopharyngeus muscle, moreso w/ more effortful swallow w/ trials of increased texture foods. Pt does have a Baseline h/o GERD/REFLUX (which he endorsed intermittent s/s of such for "a few years" but "it has progressively worsened") and has been recently placed on a PPI(~3 weeks) by MD. Suspect pt's Globus feelings he experiences could be related to the slightly prominent CP muscle as well as  Reflux. No overt retrograde bolus activity noted from the Esophagus. Any Esophageal phase dysmotility can impact the efficiency and motility of the pharyngeal phase of swallowing. No apparent neuromuscular deficits of swallowing noted; No aspiration or laryngeal penetration occurred during  this study today. During the pharyngeal phase, pt exhibited slight-min retention of bolus residue of increased texture trials(puree, soft solids) in the pyriform sinuses - this was intermittent. The min bolus residue remaining in the Pyriform Sinuses post initial swallow cleared w/ a f/u, DRY swallow. No other pharyngeal residue noted in pharynx. Timely pharyngeal swallow initiation was noted w/ all trial consistencies. Laryngeal excursion and pharyngeal pressure appeared WFL during the swallow. During the oral phase, pt exhibited adequate bolus management, and bolus cohesion w/ all trial consistencies. Timely A-P transfer and complete oral clearing noted b/t trials.    PLAN/RECOMMENDATIONS:  A. Diet: Regular w/ CUT meats/solids well Moistened; Thin liquids. Pills in puree if needed for easier swallowing.  B. Swallowing Precautions: general aspiration precautions to include Small, Single bites/sips Slowly.  C. Recommended consultation to: GI for formal evaluation/tx of Reflux/GERD, and any Esophageal Dysmotility(suggest direct view and assessment of Cricopharyngeus muscle)  D. Therapy recommendations: None.  E. Results and recommendations were discussed w/ pt; Video viewed, questions answered; education given on general aspiration and Reflux precautions; recommendation to GI              Dysphagia, pharyngoesophageal phase  Globus sensation - Plan: DG SWALLOW STUDY OP MEDICARE SPEECH PATH, DG SWALLOW STUDY OP MEDICARE SPEECH PATH  Dysphagia, unspecified type - Plan: DG SWALLOW STUDY OP MEDICARE SPEECH PATH, DG SWALLOW STUDY OP MEDICARE SPEECH PATH  Choking sensation - Plan: DG SWALLOW STUDY OP MEDICARE SPEECH PATH, DG SWALLOW STUDY OP MEDICARE SPEECH PATH        Problem List Patient Active Problem List   Diagnosis Date Noted  . Hypertension 02/06/2019  . PVC (premature ventricular contraction) 02/06/2019  . T2DM (type 2 diabetes mellitus) ([place]) 02/06/2019  . Glaucoma 02/06/2019   . History of kidney stones 02/06/2019  . Cardiac murmur 02/06/2019         Orinda Kenner, MS, CCC-SLP Ahmod Gillespie 10/30/2019, 4:17 PM  [facility] [place], [place], [address] Phone: [phone]   Fax:     Name: Joseph Cook MRN: [id number] Date of Birth: [birth year]

## 2019-11-23 ENCOUNTER — Other Ambulatory Visit: Payer: Self-pay | Admitting: Physician Assistant

## 2019-12-31 ENCOUNTER — Other Ambulatory Visit: Payer: Self-pay | Admitting: Physician Assistant

## 2019-12-31 DIAGNOSIS — R0989 Other specified symptoms and signs involving the circulatory and respiratory systems: Secondary | ICD-10-CM

## 2019-12-31 NOTE — Telephone Encounter (Signed)
Requested Prescriptions  Pending Prescriptions Disp Refills  . omeprazole (PRILOSEC) 40 MG capsule [Pharmacy Med Name: OMEPRAZOLE DR 40 MG CAPSULE] 90 capsule     Sig: TAKE 1 CAPSULE BY MOUTH EVERY DAY     Gastroenterology: Proton Pump Inhibitors Passed - 12/31/2019  2:18 AM      Passed - Valid encounter within last 12 months    Recent Outpatient Visits          2 months ago Gastroesophageal reflux disease without esophagitis   [facility] [place], [place], PA-C   4 months ago Type 2 diabetes mellitus with diabetic cataract, without long-term current use of insulin [facility])   [place], [place]   9 months ago Acute left-sided low back pain without sciatica   [facility] [place], [place]   10 months ago Essential hypertension   [place], Clearnce Sorrel, [place]      Future Appointments            In 1 month Burnette, Clearnce Sorrel, PA-C [employer], [place]   In 4 months Ralene Bathe, MD [place]

## 2020-01-29 ENCOUNTER — Other Ambulatory Visit: Payer: Self-pay | Admitting: Physician Assistant

## 2020-01-29 DIAGNOSIS — E7800 Pure hypercholesterolemia, unspecified: Secondary | ICD-10-CM

## 2020-02-04 NOTE — Progress Notes (Signed)
Subjective:   Joseph Cook is a 79 y.o. male who presents for an Initial Medicare Annual Wellness Visit.  Review of Systems  N/A  Cardiac Risk Factors include: advanced age (>14men, >81 women);diabetes mellitus;dyslipidemia;hypertension;male gender    Objective:    Today's Vitals   02/05/20 0959 02/05/20 1028  BP: (!) 148/78 138/70  Pulse: (!) 53 (!) 52  Temp: 97.8 F (36.6 C)   TempSrc: Oral   Weight: 201 lb 12.8 oz (91.5 kg)   Height: 5\' 10"  (1.778 m)   PainSc: 0-No pain    Body mass index is 28.96 kg/m.  Advanced Directives 02/05/2020  Does Patient Have a Medical Advance Directive? Yes  Type of Paramedic of [place];Living will  Copy of [place] in Chart? No - copy requested    Current Medications (verified) Outpatient Encounter Medications as of 02/05/2020  Medication Sig  . aspirin 325 MG tablet Take 325 mg by mouth daily.  Marland Kitchen atorvastatin (LIPITOR) 10 MG tablet TAKE 1 TABLET BY MOUTH EVERYDAY AT BEDTIME  . Cholecalciferol (VITAMIN D3) 50 MCG (2000 UT) TABS Take 2,000 Units by mouth daily.   Marland Kitchen lisinopril-hydrochlorothiazide (ZESTORETIC) 20-25 MG tablet TAKE ONE TABLET BY MOUTH EVERY DAY FOR BLOOD PRESSURE AND KIDNEY PROTECTION  . metFORMIN (GLUCOPHAGE) 500 MG tablet Take 500 mg by mouth daily.  . metoprolol succinate (TOPROL-XL) 50 MG 24 hr tablet Take 1 tablet (50 mg total) by mouth daily.  . Multiple Vitamins-Minerals (PRESERVISION AREDS 2 PO) Take 1 capsule by mouth 2 (two) times daily.   Marland Kitchen omeprazole (PRILOSEC) 40 MG capsule TAKE 1 CAPSULE BY MOUTH EVERY DAY  . tamsulosin (FLOMAX) 0.4 MG CAPS capsule TAKE 1 CAPSULE BY MOUTH EVERY DAY  . terbinafine (LAMISIL) 250 MG tablet Take 250 mg by mouth daily.   No facility-administered encounter medications on file as of 02/05/2020.    Allergies (verified) Patient has no known allergies.   History: Past Medical History:  Diagnosis Date  . Cataract   . Diabetes  mellitus   . Glaucoma   . Hyperlipidemia   . Hypertension   . PVC (premature ventricular contraction)    Past Surgical History:  Procedure Laterality Date  . HERNIA REPAIR    . ROTATOR CUFF REPAIR     Family History  Problem Relation Age of Onset  . Stroke Mother   . Heart attack Father   . Alzheimer's disease Maternal Aunt   . Heart attack Maternal Uncle   . Cancer Maternal Grandmother   . Cancer Paternal Grandmother    Social History   Socioeconomic History  . Marital status: Married    Spouse name: Not on file  . Number of children: 5  . Years of education: Not on file  . Highest education level: Some college, no degree  Occupational History  . Occupation: retired  Tobacco Use  . Smoking status: Never Smoker  . Smokeless tobacco: Former Network engineer and Sexual Activity  . Alcohol use: Not Currently  . Drug use: No  . Sexual activity: Not on file  Other Topics Concern  . Not on file  Social History Narrative  . Not on file   Social Determinants of Health   Financial Resource Strain: Low Risk   . Difficulty of Paying Living Expenses: Not hard at all  Food Insecurity: No Food Insecurity  . Worried About Charity fundraiser in the Last Year: Never true  . Ran Out of Food in the Last  Year: Never true  Transportation Needs: No Transportation Needs  . Lack of Transportation (Medical): No  . Lack of Transportation (Non-Medical): No  Physical Activity: Inactive  . Days of Exercise per Week: 0 days  . Minutes of Exercise per Session: 0 min  Stress: No Stress Concern Present  . Feeling of Stress : Not at all  Social Connections: Not Isolated  . Frequency of Communication with Friends and Family: More than three times a week  . Frequency of Social Gatherings with Friends and Family: Twice a week  . Attends Religious Services: More than 4 times per year  . Active Member of Clubs or Organizations: Yes  . Attends Archivist Meetings: More than 4 times  per year  . Marital Status: Married   Tobacco Counseling Counseling given: Not Answered   Clinical Intake:  Pre-visit preparation completed: Yes  Pain : No/denies pain Pain Score: 0-No pain     Nutritional Status: BMI 25 -29 Overweight Nutritional Risks: None Diabetes: Yes  How often do you need to have someone help you when you read instructions, pamphlets, or other written materials from your doctor or pharmacy?: 1 - Never   Diabetes:  Is the patient diabetic?  Yes  If diabetic, was a CBG obtained today?  No  Did the patient bring in their glucometer from home?  No  How often do you monitor your CBG's? Once a day.   Financial Strains and Diabetes Management:  Are you having any financial strains with the device, your supplies or your medication? No .  Does the patient want to be seen by Chronic Care Management for management of their diabetes?  No  Would the patient like to be referred to a Nutritionist or for Diabetic Management?  No   Diabetic Exams:  Diabetic Eye Exam: Completed 08/13/19. Repeat yearly.  Diabetic Foot Exam: Currently due. Pt has been advised about the importance in completing this exam. Note made to follow up on this at [place] in office apt.    Interpreter Needed?: No  Information entered by :: [facility], LPN  Activities of Daily Living In your present state of health, do you have any difficulty performing the following activities: 02/05/2020 02/06/2019  Hearing? N N  Vision? N N  Difficulty concentrating or making decisions? N N  Walking or climbing stairs? N N  Dressing or bathing? N N  Doing errands, shopping? N N  Preparing Food and eating ? N -  Using the Toilet? N -  In the past six months, have you accidently leaked urine? N -  Do you have problems with loss of bowel control? N -  Managing your Medications? N -  Managing your Finances? N -  Housekeeping or managing your Housekeeping? N -  Some recent data might be hidden       Immunizations and Health Maintenance Immunization History  Administered Date(s) Administered  . Tdap 06/18/2012   Health Maintenance Due  Topic Date Due  . FOOT EXAM  Never done  . COVID-19 Vaccine (1) Never done  . PNA vac Low Risk Adult (1 of 2 - PCV13) Never done    Patient Care Team: Mar Daring, PA-C as PCP - General (Family Medicine) [place], [facility]) Ralene Bathe, MD (Dermatology)  Indicate any recent Medical Services you may have received from other than [facility] providers in the past year (date may be approximate).    Assessment:   This is a routine wellness examination for  Joseph Cook.  Hearing/Vision screen No exam data present  Dietary issues and exercise activities discussed: Current Exercise Habits: Home exercise routine, Type of exercise: walking(walks dog and plays golf), Time (Minutes): 45, Frequency (Times/Week): 5, Weekly Exercise (Minutes/Week): 225, Intensity: Mild, Exercise limited by: None identified  Goals    . DIET - INCREASE WATER INTAKE     Recommend to drink at least 6-8 8oz glasses of water per day.      Depression Screen PHQ 2/9 Scores 02/05/2020 02/06/2019  PHQ - 2 Score 0 0    Fall Risk Fall Risk  02/05/2020 02/06/2019  Falls in the past year? 0 0  Number falls in past yr: 0 -  Injury with Fall? 0 -    FALL RISK PREVENTION PERTAINING TO THE HOME:  Any stairs in or around the home? Yes  If so, are there any without handrails? No   Home free of loose throw rugs in walkways, pet beds, electrical cords, etc? Yes  Adequate lighting in your home to reduce risk of falls? Yes   ASSISTIVE DEVICES UTILIZED TO PREVENT FALLS:  Life alert? No  Use of a cane, walker or w/c? No  Grab bars in the bathroom? No  Shower chair or bench in shower? No  Elevated toilet seat or a handicapped toilet? No    TIMED UP AND GO:  Was the test performed? No .    Cognitive Function:     6CIT Screen 02/05/2020  What Year? 0  points  What month? 0 points  What time? 0 points  Count back from 20 0 points  Months in reverse 0 points  Repeat phrase 0 points  Total Score 0    Screening Tests Health Maintenance  Topic Date Due  . FOOT EXAM  Never done  . COVID-19 Vaccine (1) Never done  . PNA vac Low Risk Adult (1 of 2 - PCV13) Never done  . HEMOGLOBIN A1C  02/08/2020  . INFLUENZA VACCINE  04/25/2020  . OPHTHALMOLOGY EXAM  08/12/2020  . TETANUS/TDAP  06/18/2022    Qualifies for Shingles Vaccine? Yes . Due for Shingrix. Pt has been advised to call insurance company to determine out of pocket expense. Advised may also receive vaccine at local pharmacy or Health Dept. Verbalized acceptance and understanding.  Tdap: Up to date  Flu Vaccine: Up to date  Pneumococcal Vaccine: Pt states he has received these vaccines with former PCP. Requested records to update chart. Note sent home with pt to call and retreive information prior to tomorrows apt with PCP.   Cancer Screenings:  Colorectal Screening: No longer required.   Lung Cancer Screening: (Low Dose CT Chest recommended if Age 18-80 years, 30 pack-year currently smoking OR have quit w/in 15years.) does not qualify.   Additional Screening:  Dental Screening: Recommended annual dental exams for proper oral hygiene  Community Resource Referral:  CRR required this visit?  No        Plan:  I have personally reviewed and addressed the Medicare Annual Wellness questionnaire and have noted the following in the patient's chart:  A. Medical and social history B. Use of alcohol, tobacco or illicit drugs  C. Current medications and supplements D. Functional ability and status E.  Nutritional status F.  Physical activity G. Advance directives H. List of other physicians I.  Hospitalizations, surgeries, and ER visits in previous 12 months J.  [place] such as hearing and vision if needed, cognitive and depression L. Referrals and  appointments  In addition, I have reviewed and discussed with patient certain preventive protocols, quality metrics, and best practice recommendations. A written personalized care plan for preventive services as well as general preventive health recommendations were provided to patient.   [place]endora Score, [place]   [id number]  Nurse Health Advisor    Nurse Notes: Pt needs a diabetic foot exam at [place] in office apt. Pt to check with previous PCP and get dates of both pneumonia vaccines received. Note made for patient to bring information in to tomorrows apt.    Offered AVS and pt declined.

## 2020-02-05 ENCOUNTER — Ambulatory Visit (INDEPENDENT_AMBULATORY_CARE_PROVIDER_SITE_OTHER): Payer: Medicare Other

## 2020-02-05 ENCOUNTER — Other Ambulatory Visit: Payer: Self-pay

## 2020-02-05 VITALS — BP 138/70 | HR 52 | Temp 97.8°F | Ht 70.0 in | Wt 201.8 lb

## 2020-02-05 DIAGNOSIS — Z0000 Encounter for general adult medical examination without abnormal findings: Secondary | ICD-10-CM | POA: Diagnosis not present

## 2020-02-05 NOTE — Patient Instructions (Signed)
Joseph Cook , Thank you for taking time to come for your Medicare Wellness Visit. I appreciate your ongoing commitment to your health goals. Please review the following plan we discussed and let me know if I can assist you in the future.   Screening recommendations/referrals: Colonoscopy: No longer required.  Recommended yearly ophthalmology/optometry visit for glaucoma screening and checkup Recommended yearly dental visit for hygiene and checkup  Vaccinations: Influenza vaccine: Up to date Pneumococcal vaccine: Received at previous physicians office. Requested records to up date file.  Tdap vaccine: Up to date, due 05/2022 Shingles vaccine: Pt declines today.     Advanced directives: Please bring a copy of your POA (Power of Attorney) and/or Living Will to your next appointment.   Conditions/risks identified: Recommend to drink at least 6-8 8oz glasses of water per day.  Next appointment: 02/06/20 @ 9:40 AM with Fenton Malling. Declined scheduling an AWV for 2022 at this time.   Preventive Care 79 Years and Older, Male Preventive care refers to lifestyle choices and visits with your health care provider that can promote health and wellness. What does preventive care include?  A yearly physical exam. This is also called an annual well check.  Dental exams once or twice a year.  Routine eye exams. Ask your health care provider how often you should have your eyes checked.  Personal lifestyle choices, including:  Daily care of your teeth and gums.  Regular physical activity.  Eating a healthy diet.  Avoiding tobacco and drug use.  Limiting alcohol use.  Practicing safe sex.  Taking low doses of aspirin every day.  Taking vitamin and mineral supplements as recommended by your health care provider. What happens during an annual well check? The services and screenings done by your health care provider during your annual well check will depend on your age, overall health,  lifestyle risk factors, and family history of disease. Counseling  Your health care provider may ask you questions about your:  Alcohol use.  Tobacco use.  Drug use.  Emotional well-being.  Home and relationship well-being.  Sexual activity.  Eating habits.  History of falls.  Memory and ability to understand (cognition).  Work and work Statistician. Screening  You may have the following tests or measurements:  Height, weight, and BMI.  Blood pressure.  Lipid and cholesterol levels. These may be checked every 5 years, or more frequently if you are over 71 years old.  Skin check.  Lung cancer screening. You may have this screening every year starting at age 51 if you have a 30-pack-year history of smoking and currently smoke or have quit within the past 15 years.  Fecal occult blood test (FOBT) of the stool. You may have this test every year starting at age 48.  Flexible sigmoidoscopy or colonoscopy. You may have a sigmoidoscopy every 5 years or a colonoscopy every 10 years starting at age 20.  Prostate cancer screening. Recommendations will vary depending on your family history and other risks.  Hepatitis C blood test.  Hepatitis B blood test.  Sexually transmitted disease (STD) testing.  Diabetes screening. This is done by checking your blood sugar (glucose) after you have not eaten for a while (fasting). You may have this done every 1-3 years.  Abdominal aortic aneurysm (AAA) screening. You may need this if you are a current or former smoker.  Osteoporosis. You may be screened starting at age 39 if you are at high risk. Talk with your health care provider about your test  results, treatment options, and if necessary, the need for more tests. Vaccines  Your health care provider may recommend certain vaccines, such as:  Influenza vaccine. This is recommended every year.  Tetanus, diphtheria, and acellular pertussis (Tdap, Td) vaccine. You may need a Td booster  every 10 years.  Zoster vaccine. You may need this after age 27.  Pneumococcal 13-valent conjugate (PCV13) vaccine. One dose is recommended after age 65.  Pneumococcal polysaccharide (PPSV23) vaccine. One dose is recommended after age 30. Talk to your health care provider about which screenings and vaccines you need and how often you need them. This information is not intended to replace advice given to you by your health care provider. Make sure you discuss any questions you have with your health care provider. Document Released: 10/08/2015 Document Revised: 05/31/2016 Document Reviewed: 07/13/2015 Elsevier Interactive Patient Education  2017 [place] Prevention in the Home Falls can cause injuries. They can happen to people of all ages. There are many things you can do to make your home safe and to help prevent falls. What can I do on the outside of my home?  Regularly fix the edges of walkways and driveways and fix any cracks.  Remove anything that might make you trip as you walk through a door, such as a raised step or threshold.  Trim any bushes or trees on the path to your home.  Use bright outdoor lighting.  Clear any walking paths of anything that might make someone trip, such as rocks or tools.  Regularly check to see if handrails are loose or broken. Make sure that both sides of any steps have handrails.  Any raised decks and porches should have guardrails on the edges.  Have any leaves, snow, or ice cleared regularly.  Use sand or salt on walking paths during winter.  Clean up any spills in your garage right away. This includes oil or grease spills. What can I do in the bathroom?  Use night lights.  Install grab bars by the toilet and in the tub and shower. Do not use towel bars as grab bars.  Use non-skid mats or decals in the tub or shower.  If you need to sit down in the shower, use a plastic, non-slip stool.  Keep the floor dry. Clean up any  water that spills on the floor as soon as it happens.  Remove soap buildup in the tub or shower regularly.  Attach bath mats securely with double-sided non-slip rug tape.  Do not have throw rugs and other things on the floor that can make you trip. What can I do in the bedroom?  Use night lights.  Make sure that you have a light by your bed that is easy to reach.  Do not use any sheets or blankets that are too big for your bed. They should not hang down onto the floor.  Have a firm chair that has side arms. You can use this for support while you get dressed.  Do not have throw rugs and other things on the floor that can make you trip. What can I do in the kitchen?  Clean up any spills right away.  Avoid walking on wet floors.  Keep items that you use a lot in easy-to-reach places.  If you need to reach something above you, use a strong step stool that has a grab bar.  Keep electrical cords out of the way.  Do not use floor polish or wax that makes  floors slippery. If you must use wax, use non-skid floor wax.  Do not have throw rugs and other things on the floor that can make you trip. What can I do with my stairs?  Do not leave any items on the stairs.  Make sure that there are handrails on both sides of the stairs and use them. Fix handrails that are broken or loose. Make sure that handrails are as long as the stairways.  Check any carpeting to make sure that it is firmly attached to the stairs. Fix any carpet that is loose or worn.  Avoid having throw rugs at the top or bottom of the stairs. If you do have throw rugs, attach them to the floor with carpet tape.  Make sure that you have a light switch at the top of the stairs and the bottom of the stairs. If you do not have them, ask someone to add them for you. What else can I do to help prevent falls?  Wear shoes that:  Do not have high heels.  Have rubber bottoms.  Are comfortable and fit you well.  Are closed  at the toe. Do not wear sandals.  If you use a stepladder:  Make sure that it is fully opened. Do not climb a closed stepladder.  Make sure that both sides of the stepladder are locked into place.  Ask someone to hold it for you, if possible.  Clearly mark and make sure that you can see:  Any grab bars or handrails.  First and last steps.  Where the edge of each step is.  Use tools that help you move around (mobility aids) if they are needed. These include:  Canes.  Walkers.  Scooters.  Crutches.  Turn on the lights when you go into a dark area. Replace any light bulbs as soon as they burn out.  Set up your furniture so you have a clear path. Avoid moving your furniture around.  If any of your floors are uneven, fix them.  If there are any pets around you, be aware of where they are.  Review your medicines with your doctor. Some medicines can make you feel dizzy. This can increase your chance of falling. Ask your doctor what other things that you can do to help prevent falls. This information is not intended to replace advice given to you by your health care provider. Make sure you discuss any questions you have with your health care provider. Document Released: 07/08/2009 Document Revised: 02/17/2016 Document Reviewed: 10/16/2014 Elsevier Interactive Patient Education  2017 [employer].

## 2020-02-06 ENCOUNTER — Ambulatory Visit (INDEPENDENT_AMBULATORY_CARE_PROVIDER_SITE_OTHER): Payer: Medicare Other | Admitting: Physician Assistant

## 2020-02-06 ENCOUNTER — Encounter: Payer: Self-pay | Admitting: Physician Assistant

## 2020-02-06 ENCOUNTER — Other Ambulatory Visit: Payer: Self-pay

## 2020-02-06 VITALS — BP 138/76 | HR 56 | Temp 96.6°F | Ht 70.0 in | Wt 197.0 lb

## 2020-02-06 DIAGNOSIS — E785 Hyperlipidemia, unspecified: Secondary | ICD-10-CM | POA: Insufficient documentation

## 2020-02-06 DIAGNOSIS — I10 Essential (primary) hypertension: Secondary | ICD-10-CM

## 2020-02-06 DIAGNOSIS — H6123 Impacted cerumen, bilateral: Secondary | ICD-10-CM

## 2020-02-06 DIAGNOSIS — E1169 Type 2 diabetes mellitus with other specified complication: Secondary | ICD-10-CM | POA: Insufficient documentation

## 2020-02-06 DIAGNOSIS — E1136 Type 2 diabetes mellitus with diabetic cataract: Secondary | ICD-10-CM | POA: Diagnosis not present

## 2020-02-06 DIAGNOSIS — E7800 Pure hypercholesterolemia, unspecified: Secondary | ICD-10-CM

## 2020-02-06 LAB — POCT GLYCOSYLATED HEMOGLOBIN (HGB A1C)
Est. average glucose Bld gHb Est-mCnc: 137
Hemoglobin A1C: 6.4 % — AB (ref 4.0–5.6)

## 2020-02-06 MED ORDER — METOPROLOL SUCCINATE ER 50 MG PO TB24: 50.0000 mg | ORAL_TABLET | Freq: Every day | ORAL | 3 refills | Status: DC

## 2020-02-06 MED ORDER — LISINOPRIL-HYDROCHLOROTHIAZIDE 20-25 MG PO TABS: ORAL_TABLET | ORAL | 1 refills | Status: DC

## 2020-02-06 NOTE — Patient Instructions (Signed)
Health Maintenance After Age 79 After age 79, you are at a higher risk for certain long-term diseases and infections as well as injuries from falls. Falls are a major cause of broken bones and head injuries in people who are older than age 79. Getting regular preventive care can help to keep you healthy and well. Preventive care includes getting regular testing and making lifestyle changes as recommended by your health care provider. Talk with your health care provider about:  Which screenings and tests you should have. A screening is a test that checks for a disease when you have no symptoms.  A diet and exercise plan that is right for you. What should I know about screenings and tests to prevent falls? Screening and testing are the best ways to find a health problem early. Early diagnosis and treatment give you the best chance of managing medical conditions that are common after age 79. Certain conditions and lifestyle choices may make you more likely to have a fall. Your health care provider may recommend:  Regular vision checks. Poor vision and conditions such as cataracts can make you more likely to have a fall. If you wear glasses, make sure to get your prescription updated if your vision changes.  Medicine review. Work with your health care provider to regularly review all of the medicines you are taking, including over-the-counter medicines. Ask your health care provider about any side effects that may make you more likely to have a fall. Tell your health care provider if any medicines that you take make you feel dizzy or sleepy.  Osteoporosis screening. Osteoporosis is a condition that causes the bones to get weaker. This can make the bones weak and cause them to break more easily.  Blood pressure screening. Blood pressure changes and medicines to control blood pressure can make you feel dizzy.  Strength and balance checks. Your health care provider may recommend certain tests to check your  strength and balance while standing, walking, or changing positions.  Foot health exam. Foot pain and numbness, as well as not wearing proper footwear, can make you more likely to have a fall.  Depression screening. You may be more likely to have a fall if you have a fear of falling, feel emotionally low, or feel unable to do activities that you used to do.  Alcohol use screening. Using too much alcohol can affect your balance and may make you more likely to have a fall. What actions can I take to lower my risk of falls? General instructions  Talk with your health care provider about your risks for falling. Tell your health care provider if: ? You fall. Be sure to tell your health care provider about all falls, even ones that seem minor. ? You feel dizzy, sleepy, or off-balance.  Take over-the-counter and prescription medicines only as told by your health care provider. These include any supplements.  Eat a healthy diet and maintain a healthy weight. A healthy diet includes low-fat dairy products, low-fat (lean) meats, and fiber from whole grains, beans, and lots of fruits and vegetables. Home safety  Remove any tripping hazards, such as rugs, cords, and clutter.  Install safety equipment such as grab bars in bathrooms and safety rails on stairs.  Keep rooms and walkways well-lit. Activity   Follow a regular exercise program to stay fit. This will help you maintain your balance. Ask your health care provider what types of exercise are appropriate for you.  If you need a cane or   walker, use it as recommended by your health care provider.  Wear supportive shoes that have nonskid soles. Lifestyle  Do not drink alcohol if your health care provider tells you not to drink.  If you drink alcohol, limit how much you have: ? 0-1 drink a day for women. ? 0-2 drinks a day for men.  Be aware of how much alcohol is in your drink. In the [place] one drink equals one typical bottle of beer (12  oz), one-half glass of wine (5 oz), or one shot of hard liquor (1 oz).  Do not use any products that contain nicotine or tobacco, such as cigarettes and e-cigarettes. If you need help quitting, ask your health care provider. Summary  Having a healthy lifestyle and getting preventive care can help to protect your health and wellness after age 79.  Screening and testing are the best way to find a health problem early and help you avoid having a fall. Early diagnosis and treatment give you the best chance for managing medical conditions that are more common for people who are older than age 79.  Falls are a major cause of broken bones and head injuries in people who are older than age 79. Take precautions to prevent a fall at home.  Work with your health care provider to learn what changes you can make to improve your health and wellness and to prevent falls. This information is not intended to replace advice given to you by your health care provider. Make sure you discuss any questions you have with your health care provider. Document Revised: 01/02/2019 Document Reviewed: 07/25/2017 Elsevier Patient Education  2020 Elsevier Inc.  

## 2020-02-06 NOTE — Progress Notes (Signed)
Established patient visit   Patient: Joseph Cook   DOB: [birth year]   79 y.o. Male  MRN: [id number] Visit Date: 02/06/2020  Today's healthcare provider: Mar Daring, PA-C   Chief Complaint  Patient presents with  . Hypertension  . Diabetes  . Hyperlipidemia   Subjective    HPI Diabetes Mellitus Type II, Follow-up  Lab Results  Component Value Date   HGBA1C 6.4 (A) 02/06/2020   HGBA1C 6.6 (A) 08/11/2019   HGBA1C 6.2 (H) 02/06/2019   Wt Readings from Last 3 Encounters:  02/06/20 197 lb (89.4 kg)  02/05/20 201 lb 12.8 oz (91.5 kg)  08/11/19 207 lb (93.9 kg)   Last seen for diabetes 6 months ago.  Management since then includes continue Metformin 500mg  daily. Discussed lifestyle modifications and limiting carbs and sugars. He reports excellent compliance with treatment. He is not having side effects.  Symptoms: No fatigue No foot ulcerations  No appetite changes No nausea  No paresthesia of the feet  No polydipsia  No polyuria No visual disturbances   No vomiting     Home blood sugar records: fasting range: 110's  Episodes of hypoglycemia? No    Current insulin regiment: None Most Recent Eye Exam: 08/13/2019 Current exercise: walking plays golf Current diet habits: in general, a "healthy" diet    Pertinent Labs: Lab Results  Component Value Date   CHOL 122 02/06/2019   HDL 41 02/06/2019   LDLCALC 61 02/06/2019   TRIG 100 02/06/2019   CHOLHDL 3.0 02/06/2019   Lab Results  Component Value Date   NA 141 02/06/2019   K 4.1 02/06/2019   CREATININE 0.84 02/06/2019   GFRNONAA 84 02/06/2019   GFRAA 98 02/06/2019   GLUCOSE 130 (H) 02/06/2019     --------------------------------------------------------------------------------------------------- Hypertension, follow-up  BP Readings from Last 3 Encounters:  02/06/20 138/76  02/05/20 138/70  08/11/19 139/79   Wt Readings from Last 3 Encounters:  02/06/20 197 lb (89.4 kg)  02/05/20 201 lb  12.8 oz (91.5 kg)  08/11/19 207 lb (93.9 kg)     He was last seen for hypertension 1 years ago.  BP at that visit was 159/81. Management since that visit includes Continue current medical treatment plan of lisinopril-hctz 20-25mg  and metoprolol XR 50mg   He reports excellent compliance with treatment. He is not having side effects.  He is following a Low Sodium diet. He is exercising. He does not smoke.  Use of agents associated with hypertension: none.   Outside blood pressures are not being check. Symptoms: No chest pain No chest pressure  No palpitations No syncope  No dyspnea No orthopnea  No paroxysmal nocturnal dyspnea No lower extremity edema   Pertinent labs: Lab Results  Component Value Date   CHOL 122 02/06/2019   HDL 41 02/06/2019   LDLCALC 61 02/06/2019   TRIG 100 02/06/2019   CHOLHDL 3.0 02/06/2019   Lab Results  Component Value Date   NA 141 02/06/2019   K 4.1 02/06/2019   CREATININE 0.84 02/06/2019   GFRNONAA 84 02/06/2019   GFRAA 98 02/06/2019   GLUCOSE 130 (H) 02/06/2019     The ASCVD Risk score (Goff DC Jr., et al., 2013) failed to calculate for the following reasons:   The valid total cholesterol range is 130 to 320 mg/dL   --------------------------------------------------------------------------------------------------- Patient Active Problem List   Diagnosis Date Noted  . Hyperlipidemia   . Hypertension 02/06/2019  . PVC (premature ventricular contraction) 02/06/2019  . T2DM (type  2 diabetes mellitus) (Ali Molina) 02/06/2019  . Glaucoma 02/06/2019  . History of kidney stones 02/06/2019  . Cardiac murmur 02/06/2019   Past Medical History:  Diagnosis Date  . Cataract   . Diabetes mellitus   . Glaucoma   . Hyperlipidemia   . Hypertension   . PVC (premature ventricular contraction)    Past Surgical History:  Procedure Laterality Date  . HERNIA REPAIR    . ROTATOR CUFF REPAIR     Social History   Tobacco Use  . Smoking status: Never  Smoker  . Smokeless tobacco: Former Network engineer Use Topics  . Alcohol use: Not Currently  . Drug use: No   Family Status  Relation Name Status  . Mother  (Not Specified)  . Father  (Not Specified)  . Mat Aunt  (Not Specified)  . Mat Uncle  (Not Specified)  . MGM  (Not Specified)  . PGM  (Not Specified)   No Known Allergies   Medications: Outpatient Medications Prior to Visit  Medication Sig  . aspirin 325 MG tablet Take 325 mg by mouth daily.  Marland Kitchen atorvastatin (LIPITOR) 10 MG tablet TAKE 1 TABLET BY MOUTH EVERYDAY AT BEDTIME  . Cholecalciferol (VITAMIN D3) 50 MCG (2000 UT) TABS Take 2,000 Units by mouth daily.   . metFORMIN (GLUCOPHAGE) 500 MG tablet Take 500 mg by mouth daily.  . Multiple Vitamins-Minerals (PRESERVISION AREDS 2 PO) Take 1 capsule by mouth 2 (two) times daily.   Marland Kitchen omeprazole (PRILOSEC) 40 MG capsule TAKE 1 CAPSULE BY MOUTH EVERY DAY  . tamsulosin (FLOMAX) 0.4 MG CAPS capsule TAKE 1 CAPSULE BY MOUTH EVERY DAY  . terbinafine (LAMISIL) 250 MG tablet Take 250 mg by mouth daily.  . [DISCONTINUED] lisinopril-hydrochlorothiazide (ZESTORETIC) 20-25 MG tablet TAKE ONE TABLET BY MOUTH EVERY DAY FOR BLOOD PRESSURE AND KIDNEY PROTECTION  . [DISCONTINUED] metoprolol succinate (TOPROL-XL) 50 MG 24 hr tablet Take 1 tablet (50 mg total) by mouth daily.   No facility-administered medications prior to visit.    Review of Systems  Constitutional: Negative.   HENT: Negative.   Eyes: Negative.   Respiratory: Negative.   Cardiovascular: Negative.   Gastrointestinal: Negative.   Endocrine: Negative.   Genitourinary: Negative.   Musculoskeletal: Positive for back pain (Chronic issue). Negative for arthralgias, gait problem, joint swelling, myalgias, neck pain and neck stiffness.  Skin: Negative.   Allergic/Immunologic: Negative.   Neurological: Negative.   Hematological: Negative.   Psychiatric/Behavioral: Negative.     Last CBC Lab Results  Component Value Date   WBC  7.1 02/06/2019   HGB 15.2 02/06/2019   HCT 43.1 02/06/2019   MCV 87 02/06/2019   MCH 30.6 02/06/2019   RDW 13.2 02/06/2019   PLT 173 [id number]   Last metabolic panel Lab Results  Component Value Date   GLUCOSE 130 (H) 02/06/2019   NA 141 02/06/2019   K 4.1 02/06/2019   CL 102 02/06/2019   CO2 20 02/06/2019   BUN 27 02/06/2019   CREATININE 0.84 02/06/2019   GFRNONAA 84 02/06/2019   GFRAA 98 02/06/2019   CALCIUM 9.9 02/06/2019   PROT 6.9 02/06/2019   ALBUMIN 4.9 (H) 02/06/2019   LABGLOB 2.0 02/06/2019   AGRATIO 2.5 (H) 02/06/2019   BILITOT 0.6 02/06/2019   ALKPHOS 48 02/06/2019   AST 14 02/06/2019   ALT 16 02/06/2019   Last lipids Lab Results  Component Value Date   CHOL 122 02/06/2019   HDL 41 02/06/2019   LDLCALC 61 02/06/2019  TRIG 100 02/06/2019   CHOLHDL 3.0 02/06/2019   Last hemoglobin A1c Lab Results  Component Value Date   HGBA1C 6.4 (A) 02/06/2020   Last thyroid functions Lab Results  Component Value Date   TSH 2.360 02/06/2019   Last vitamin D No results found for: 25OHVITD2, 25OHVITD3, VD25OH Last vitamin B12 and Folate No results found for: VITAMINB12, FOLATE  Objective    BP 138/76 (BP Location: Left Arm, Patient Position: Sitting, Cuff Size: Large)   Pulse (!) 56   Temp (!) 96.6 F (35.9 C) (Temporal)   Ht 5\' 10"  (1.778 m)   Wt 197 lb (89.4 kg)   SpO2 98%   BMI 28.27 kg/m  BP Readings from Last 3 Encounters:  02/06/20 138/76  02/05/20 138/70  08/11/19 139/79    Physical Exam Vitals reviewed.  Constitutional:      General: He is not in acute distress.    Appearance: Normal appearance. He is well-developed and overweight. He is not ill-appearing.  HENT:     Head: Normocephalic and atraumatic.     Right Ear: External ear normal. There is impacted cerumen.     Left Ear: External ear normal. There is impacted cerumen.  Eyes:     General: No scleral icterus.       Right eye: No discharge.        Left eye: No discharge.      Extraocular Movements: Extraocular movements intact.     Conjunctiva/sclera: Conjunctivae normal.     Pupils: Pupils are equal, round, and reactive to light.  Neck:     Thyroid: No thyromegaly.     Vascular: No carotid bruit.     Trachea: No tracheal deviation.  Cardiovascular:     Rate and Rhythm: Normal rate and regular rhythm.     Pulses: Normal pulses.     Heart sounds: Murmur present.  Pulmonary:     Effort: Pulmonary effort is normal. No respiratory distress.     Breath sounds: Normal breath sounds. No wheezing or rales.  Chest:     Chest wall: No tenderness.  Abdominal:     General: Abdomen is protuberant. Bowel sounds are increased. There is no distension.     Palpations: Abdomen is soft. There is no mass.     Tenderness: There is no abdominal tenderness. There is no guarding or rebound.  Musculoskeletal:        General: No tenderness. Normal range of motion.     Cervical back: Normal range of motion and neck supple.     Right lower leg: No edema.     Left lower leg: No edema.  Lymphadenopathy:     Cervical: No cervical adenopathy.  Skin:    General: Skin is warm and dry.     Capillary Refill: Capillary refill takes less than 2 seconds.     Findings: No erythema or rash.  Neurological:     General: No focal deficit present.     Mental Status: He is alert and oriented to person, place, and time. Mental status is at baseline.     Cranial Nerves: No cranial nerve deficit.     Motor: No abnormal muscle tone.     Coordination: Coordination normal.     Deep Tendon Reflexes: Reflexes are normal and symmetric. Reflexes normal.  Psychiatric:        Mood and Affect: Mood normal.        Behavior: Behavior normal. Behavior is cooperative.  Thought Content: Thought content normal.        Judgment: Judgment normal.       Results for orders placed or performed in visit on 02/06/20  POCT HgB A1C  Result Value Ref Range   Hemoglobin A1C 6.4 (A) 4.0 - 5.6 %   Est.  average glucose Bld gHb Est-mCnc 137     Assessment & Plan     1. Type 2 diabetes mellitus with diabetic cataract, without long-term current use of insulin (HCC) Stable. Continue Metformin 500mg  daily. Will check labs as below and f/u pending results. Return in 6 months. - CBC w/Diff/Platelet - Comprehensive Metabolic Panel (CMET) - Lipid Panel With LDL/HDL Ratio - lisinopril-hydrochlorothiazide (ZESTORETIC) 20-25 MG tablet; TAKE ONE TABLET BY MOUTH EVERY DAY FOR BLOOD PRESSURE AND KIDNEY PROTECTION  Dispense: 90 tablet; Refill: 1  2. Essential hypertension Stable. Diagnosis pulled for medication refill. Continue current medical treatment plan. Will check labs as below and f/u pending results. - CBC w/Diff/Platelet - Comprehensive Metabolic Panel (CMET) - Lipid Panel With LDL/HDL Ratio - lisinopril-hydrochlorothiazide (ZESTORETIC) 20-25 MG tablet; TAKE ONE TABLET BY MOUTH EVERY DAY FOR BLOOD PRESSURE AND KIDNEY PROTECTION  Dispense: 90 tablet; Refill: 1 - metoprolol succinate (TOPROL-XL) 50 MG 24 hr tablet; Take 1 tablet (50 mg total) by mouth daily.  Dispense: 90 tablet; Refill: 3  3. Pure hypercholesterolemia Stable on Atorvastatin 10mg . Will check labs as below and f/u pending results. - CBC w/Diff/Platelet - Comprehensive Metabolic Panel (CMET) - Lipid Panel With LDL/HDL Ratio  4. Bilateral impacted cerumen Noted bilaterally. Ear lavage performed and successful. Return in 6 months.  - Ear Lavage   Return in about 6 months (around 08/08/2020) for t2dm, htn, ears.      Reynolds Bowl, PA-C, have reviewed all documentation for this visit. The documentation on 02/06/20 for the exam, diagnosis, procedures, and orders are all accurate and complete.   Rubye Beach  [facility] [phone] (phone) [phone] (fax)  [place]

## 2020-02-07 LAB — LIPID PANEL WITH LDL/HDL RATIO
Cholesterol, Total: 169 mg/dL (ref 100–199)
HDL: 35 mg/dL — ABNORMAL LOW (ref >39)
LDL Chol Calc (NIH): 111 mg/dL — ABNORMAL HIGH (ref 0–99)
LDL/HDL Ratio: 3.2 ratio (ref 0.0–3.6)
Triglycerides: 125 mg/dL (ref 0–149)
VLDL Cholesterol Cal: 23 mg/dL (ref 5–40)

## 2020-02-07 LAB — CBC WITH DIFFERENTIAL/PLATELET
Basophils Absolute: 0 10*3/uL (ref 0.0–0.2)
Basos: 0 %
EOS (ABSOLUTE): 0.2 10*3/uL (ref 0.0–0.4)
Eos: 3 %
Hematocrit: 45.4 % (ref 37.5–51.0)
Hemoglobin: 15.2 g/dL (ref 13.0–17.7)
Immature Grans (Abs): 0 10*3/uL (ref 0.0–0.1)
Immature Granulocytes: 0 %
Lymphocytes Absolute: 2.4 10*3/uL (ref 0.7–3.1)
Lymphs: 32 %
MCH: 30 pg (ref 26.6–33.0)
MCHC: 33.5 g/dL (ref 31.5–35.7)
MCV: 90 fL (ref 79–97)
Monocytes Absolute: 0.6 10*3/uL (ref 0.1–0.9)
Monocytes: 8 %
Neutrophils Absolute: 4.3 10*3/uL (ref 1.4–7.0)
Neutrophils: 57 %
Platelets: 171 10*3/uL (ref 150–450)
RBC: 5.06 x10E6/uL (ref 4.14–5.80)
RDW: 13.6 % (ref 11.6–15.4)
WBC: 7.6 10*3/uL (ref 3.4–10.8)

## 2020-02-07 LAB — COMPREHENSIVE METABOLIC PANEL
ALT: 17 IU/L (ref 0–44)
AST: 12 IU/L (ref 0–40)
Albumin/Globulin Ratio: 2.2 (ref 1.2–2.2)
Albumin: 4.9 g/dL — ABNORMAL HIGH (ref 3.7–4.7)
Alkaline Phosphatase: 50 IU/L (ref 39–117)
BUN/Creatinine Ratio: 36 — ABNORMAL HIGH (ref 10–24)
BUN: 31 mg/dL — ABNORMAL HIGH (ref 8–27)
Bilirubin Total: 0.6 mg/dL (ref 0.0–1.2)
CO2: 20 mmol/L (ref 20–29)
Calcium: 9.6 mg/dL (ref 8.6–10.2)
Chloride: 103 mmol/L (ref 96–106)
Creatinine, Ser: 0.85 mg/dL (ref 0.76–1.27)
GFR calc Af Amer: 96 mL/min/{1.73_m2} (ref >59)
GFR calc non Af Amer: 83 mL/min/{1.73_m2} (ref >59)
Globulin, Total: 2.2 g/dL (ref 1.5–4.5)
Glucose: 111 mg/dL — ABNORMAL HIGH (ref 65–99)
Potassium: 4.1 mmol/L (ref 3.5–5.2)
Sodium: 140 mmol/L (ref 134–144)
Total Protein: 7.1 g/dL (ref 6.0–8.5)

## 2020-02-09 ENCOUNTER — Ambulatory Visit: Payer: Self-pay | Admitting: Physician Assistant

## 2020-02-17 ENCOUNTER — Telehealth: Payer: Self-pay

## 2020-02-17 NOTE — Telephone Encounter (Signed)
Patient advised as directed below. 

## 2020-02-17 NOTE — Telephone Encounter (Signed)
-----   Message from Mar Daring, PA-C sent at 02/17/2020 11:56 AM EDT ----- Sorry for the delay in lab results. I was out of town.  Blood count is normal. Kidney and liver function is normal. Sodium, potassium and calcium are normal. Sugar is borderline high. Cholesterol is normal.

## 2020-05-12 ENCOUNTER — Ambulatory Visit (INDEPENDENT_AMBULATORY_CARE_PROVIDER_SITE_OTHER): Payer: Medicare Other | Admitting: Dermatology

## 2020-05-12 ENCOUNTER — Other Ambulatory Visit: Payer: Self-pay

## 2020-05-12 ENCOUNTER — Encounter: Payer: Self-pay | Admitting: Dermatology

## 2020-05-12 DIAGNOSIS — B351 Tinea unguium: Secondary | ICD-10-CM | POA: Diagnosis not present

## 2020-05-12 DIAGNOSIS — B353 Tinea pedis: Secondary | ICD-10-CM | POA: Diagnosis not present

## 2020-05-12 DIAGNOSIS — Z1283 Encounter for screening for malignant neoplasm of skin: Secondary | ICD-10-CM

## 2020-05-12 DIAGNOSIS — D229 Melanocytic nevi, unspecified: Secondary | ICD-10-CM

## 2020-05-12 DIAGNOSIS — L821 Other seborrheic keratosis: Secondary | ICD-10-CM

## 2020-05-12 DIAGNOSIS — L814 Other melanin hyperpigmentation: Secondary | ICD-10-CM

## 2020-05-12 DIAGNOSIS — Z86018 Personal history of other benign neoplasm: Secondary | ICD-10-CM | POA: Diagnosis not present

## 2020-05-12 DIAGNOSIS — L570 Actinic keratosis: Secondary | ICD-10-CM

## 2020-05-12 DIAGNOSIS — L578 Other skin changes due to chronic exposure to nonionizing radiation: Secondary | ICD-10-CM

## 2020-05-12 DIAGNOSIS — L905 Scar conditions and fibrosis of skin: Secondary | ICD-10-CM

## 2020-05-12 DIAGNOSIS — D1800 Hemangioma unspecified site: Secondary | ICD-10-CM

## 2020-05-12 MED ORDER — TERBINAFINE HCL 250 MG PO TABS: 250.0000 mg | ORAL_TABLET | Freq: Every day | ORAL | 0 refills | Status: DC

## 2020-05-12 NOTE — Progress Notes (Signed)
Follow-Up Visit   Subjective  Joseph Cook is a 79 y.o. male who presents for the following: Annual Exam. Hx of severely dysplastic nevus - Patient has noticed a scaly lesion on his R ear that he would like checked.  The patient presents for Total-Body Skin Exam (TBSE) for skin cancer screening and mole check.  The following portions of the chart were reviewed this encounter and updated as appropriate:  Tobacco  Allergies  Meds  Problems  Med Hx  Surg Hx  Fam Hx     Review of Systems:  No other skin or systemic complaints except as noted in HPI or Assessment and Plan.  Objective  Well appearing patient in no apparent distress; mood and affect are within normal limits.  A full examination was performed including scalp, head, eyes, ears, nose, lips, neck, chest, axillae, abdomen, back, buttocks, bilateral upper extremities, bilateral lower extremities, hands, feet, fingers, toes, fingernails, and toenails. All findings within normal limits unless otherwise noted below.  Objective  right medial lower leg above medial ankle, malleolus: Scar with no evidence of recurrence.   Objective  B/L foot: Toenail dystrophy feet otherwise clear    Objective  R ear sup anti helix, L nose (2): Erythematous thin papules/macules with gritty scale.   Objective  back and left chest: Scar   Assessment & Plan  History of dysplastic nevus right medial lower leg above medial ankle, malleolus  Clear. Observe for recurrence. Call clinic for new or changing lesions.  Recommend regular skin exams, daily broad-spectrum spf 30+ sunscreen use, and photoprotection.     Tinea pedis of both feet B/L foot With tinea unguium - Improved but still some toenail dystrophy  Restart Lamisil 250mg  po QD #30 0RF.    terbinafine (LAMISIL) 250 MG tablet - B/L foot  AK (actinic keratosis) (2) R ear sup anti helix, L nose  Destruction of lesion - R ear sup anti helix, L nose Complexity: simple     Destruction method: cryotherapy   Informed consent: discussed and consent obtained   Timeout:  patient name, date of birth, surgical site, and procedure verified Lesion destroyed using liquid nitrogen: Yes   Region frozen until ice ball extended beyond lesion: Yes   Outcome: patient tolerated procedure well with no complications   Post-procedure details: wound care instructions given    Scar back and left chest  Probable skin cancer   Lentigines - Scattered tan macules - Discussed due to sun exposure - Benign, observe - Call for any changes  Seborrheic Keratoses - Stuck-on, waxy, tan-brown papules and plaques  - Discussed benign etiology and prognosis. - Observe - Call for any changes  Melanocytic Nevi - Tan-brown and/or pink-flesh-colored symmetric macules and papules - Benign appearing on exam today - Observation - Call clinic for new or changing moles - Recommend daily use of broad spectrum spf 30+ sunscreen to sun-exposed areas.   Hemangiomas - Red papules - Discussed benign nature - Observe - Call for any changes  Actinic Damage - diffuse scaly erythematous macules with underlying dyspigmentation - Recommend daily broad spectrum sunscreen SPF 30+ to sun-exposed areas, reapply every 2 hours as needed.  - Call for new or changing lesions.  Skin cancer screening performed today.  Return in about 6 months (around 11/12/2020) for UBSE - tinea unguium and pedis.  Luther Redo, CMA, am acting as scribe for Sarina Ser, MD .  Documentation: I have reviewed the above documentation for accuracy and completeness, and I agree  with the above.  Sarina Ser, MD

## 2020-05-18 ENCOUNTER — Encounter: Payer: Self-pay | Admitting: Dermatology

## 2020-05-29 ENCOUNTER — Other Ambulatory Visit: Payer: Self-pay | Admitting: Physician Assistant

## 2020-05-29 NOTE — Telephone Encounter (Signed)
Requested  medications are  due for refill today yes  Requested medications are on the active medication list yes  Last refill 6/2  Last visit 01/2020  Future visit scheduled Yes 07/2020  Notes to clinic Do not see this med or dx mentioned in notes, please assess.

## 2020-07-25 ENCOUNTER — Other Ambulatory Visit: Payer: Self-pay | Admitting: Physician Assistant

## 2020-07-25 DIAGNOSIS — E7800 Pure hypercholesterolemia, unspecified: Secondary | ICD-10-CM

## 2020-07-25 NOTE — Telephone Encounter (Signed)
Requested Prescriptions  Pending Prescriptions Disp Refills   atorvastatin (LIPITOR) 10 MG tablet [Pharmacy Med Name: ATORVASTATIN 10 MG TABLET] 90 tablet 1    Sig: TAKE 1 TABLET BY MOUTH EVERYDAY AT BEDTIME     Cardiovascular:  Antilipid - Statins Failed - 07/25/2020  9:01 AM      Failed - LDL in normal range and within 360 days    LDL Chol Calc (NIH)  Date Value Ref Range Status  02/06/2020 111 (H) 0 - 99 mg/dL Final         Failed - HDL in normal range and within 360 days    HDL  Date Value Ref Range Status  02/06/2020 35 (L) >39 mg/dL Final         Passed - Total Cholesterol in normal range and within 360 days    Cholesterol, Total  Date Value Ref Range Status  02/06/2020 169 100 - 199 mg/dL Final         Passed - Triglycerides in normal range and within 360 days    Triglycerides  Date Value Ref Range Status  02/06/2020 125 0 - 149 mg/dL Final         Passed - Patient is not pregnant      Passed - Valid encounter within last 12 months    Recent Outpatient Visits          5 months ago Type 2 diabetes mellitus with diabetic cataract, without long-term current use of insulin [facility])   [place], [place], [place]   9 months ago Gastroesophageal reflux disease without esophagitis   [facility], [place], [place]   11 months ago Type 2 diabetes mellitus with diabetic cataract, without long-term current use of insulin [facility])   [place], [place], [place]   1 year ago Acute left-sided low back pain without sciatica   [facility] [place], [place], [place]   1 year ago Essential hypertension   [place], [place], [place]      Future Appointments            In 2 weeks Marlyn Corporal, [place], PA-C [employer], [place]   In 3 months Ralene Bathe, MD [place]

## 2020-08-09 NOTE — Progress Notes (Addendum)
Established patient visit   Patient: Joseph Cook   DOB: 1941-06-05   79 y.o. Male  MRN: [id number] Visit Date: 08/11/2020  Today's healthcare provider: Mar Daring, PA-C   Chief Complaint  Patient presents with  . Follow-up   Subjective    HPI  Diabetes Mellitus Type II, Follow-up  Lab Results  Component Value Date   HGBA1C 6.4 (A) 08/11/2020   HGBA1C 6.4 (A) 02/06/2020   HGBA1C 6.6 (A) 08/11/2019   Wt Readings from Last 3 Encounters:  08/11/20 202 lb 11.2 oz (91.9 kg)  02/06/20 197 lb (89.4 kg)  02/05/20 201 lb 12.8 oz (91.5 kg)   Last seen for diabetes 6 months ago.  Management since then includesContinue Metformin 500mg  daily . He reports excellent compliance with treatment. He is not having side effects.  Symptoms: No fatigue No foot ulcerations  No appetite changes No nausea  No paresthesia of the feet  No polydipsia  No polyuria No visual disturbances   No vomiting     Home blood sugar records: fasting range: 140  Episodes of hypoglycemia? No    Current insulin regiment: none Most Recent Eye Exam: UTD Current exercise: walking and Golf Current diet habits: in general, a "healthy" diet    Pertinent Labs: Lab Results  Component Value Date   CHOL 169 02/06/2020   HDL 35 (L) 02/06/2020   LDLCALC 111 (H) 02/06/2020   TRIG 125 02/06/2020   CHOLHDL 3.0 02/06/2019   Lab Results  Component Value Date   NA 140 02/06/2020   K 4.1 02/06/2020   CREATININE 0.85 02/06/2020   GFRNONAA 83 02/06/2020   GFRAA 96 02/06/2020   GLUCOSE 111 (H) 02/06/2020     --------------------------------------------------------------------------------------------------- Hypertension, follow-up  BP Readings from Last 3 Encounters:  08/11/20 (!) 160/81  02/06/20 138/76  02/05/20 138/70   Wt Readings from Last 3 Encounters:  08/11/20 202 lb 11.2 oz (91.9 kg)  02/06/20 197 lb (89.4 kg)  02/05/20 201 lb 12.8 oz (91.5 kg)     He was last seen for  hypertension 6 months ago.  BP at that visit was 138/76. Management since that visit includes Continue current medical treatment plan  He reports excellent compliance with treatment. He is not having side effects.   Outside blood pressures are not being checked. Symptoms: No chest pain No chest pressure  No palpitations No syncope  No dyspnea No orthopnea  No paroxysmal nocturnal dyspnea Yes lower extremity edema   Pertinent labs: Lab Results  Component Value Date   CHOL 169 02/06/2020   HDL 35 (L) 02/06/2020   LDLCALC 111 (H) 02/06/2020   TRIG 125 02/06/2020   CHOLHDL 3.0 02/06/2019   Lab Results  Component Value Date   NA 140 02/06/2020   K 4.1 02/06/2020   CREATININE 0.85 02/06/2020   GFRNONAA 83 02/06/2020   GFRAA 96 02/06/2020   GLUCOSE 111 (H) 02/06/2020     The 10-year ASCVD risk score Mikey Bussing DC Jr., et al., 2013) is: 73.6%   --------------------------------------------------------------------------------------------------- Bilateral impacted cerumen: Return in 6 months Patient Active Problem List   Diagnosis Date Noted  . Hyperlipidemia   . Hypertension 02/06/2019  . PVC (premature ventricular contraction) 02/06/2019  . T2DM (type 2 diabetes mellitus) ([place]) 02/06/2019  . Glaucoma 02/06/2019  . History of kidney stones 02/06/2019  . Cardiac murmur 02/06/2019   Past Medical History:  Diagnosis Date  . Actinic keratosis   . Basal cell carcinoma   . Cataract   .  Diabetes mellitus   . Dysplastic nevus 08/13/2009   right medial lower leg above medial ankle, malleolus -severe  . Glaucoma   . Hyperlipidemia   . Hypertension   . PVC (premature ventricular contraction)        Medications: Outpatient Medications Prior to Visit  Medication Sig  . aspirin 325 MG tablet Take 325 mg by mouth daily.  Marland Kitchen atorvastatin (LIPITOR) 10 MG tablet TAKE 1 TABLET BY MOUTH EVERYDAY AT BEDTIME  . Cholecalciferol (VITAMIN D3) 50 MCG (2000 UT) TABS Take 2,000 Units by mouth  daily.   Marland Kitchen lisinopril-hydrochlorothiazide (ZESTORETIC) 20-25 MG tablet TAKE ONE TABLET BY MOUTH EVERY DAY FOR BLOOD PRESSURE AND KIDNEY PROTECTION  . metFORMIN (GLUCOPHAGE) 500 MG tablet Take 500 mg by mouth daily.  . metoprolol succinate (TOPROL-XL) 50 MG 24 hr tablet Take 1 tablet (50 mg total) by mouth daily.  . Multiple Vitamins-Minerals (PRESERVISION AREDS 2 PO) Take 1 capsule by mouth 2 (two) times daily.   Marland Kitchen omeprazole (PRILOSEC) 40 MG capsule TAKE 1 CAPSULE BY MOUTH EVERY DAY  . tamsulosin (FLOMAX) 0.4 MG CAPS capsule TAKE 1 CAPSULE BY MOUTH EVERY DAY  . [DISCONTINUED] terbinafine (LAMISIL) 250 MG tablet Take 250 mg by mouth daily.  . [DISCONTINUED] terbinafine (LAMISIL) 250 MG tablet Take 1 tablet (250 mg total) by mouth daily.   No facility-administered medications prior to visit.    Review of Systems  Constitutional: Negative.   Respiratory: Negative.   Cardiovascular: Negative.   Neurological: Negative.     Last CBC Lab Results  Component Value Date   WBC 7.6 02/06/2020   HGB 15.2 02/06/2020   HCT 45.4 02/06/2020   MCV 90 02/06/2020   MCH 30.0 02/06/2020   RDW 13.6 02/06/2020   PLT 171 55/73/2202   Last metabolic panel Lab Results  Component Value Date   GLUCOSE 111 (H) 02/06/2020   NA 140 02/06/2020   K 4.1 02/06/2020   CL 103 02/06/2020   CO2 20 02/06/2020   BUN 31 (H) 02/06/2020   CREATININE 0.85 02/06/2020   GFRNONAA 83 02/06/2020   GFRAA 96 02/06/2020   CALCIUM 9.6 02/06/2020   PROT 7.1 02/06/2020   ALBUMIN 4.9 (H) 02/06/2020   LABGLOB 2.2 02/06/2020   AGRATIO 2.2 02/06/2020   BILITOT 0.6 02/06/2020   ALKPHOS 50 02/06/2020   AST 12 02/06/2020   ALT 17 02/06/2020      Objective    BP (!) 160/81 (BP Location: Right Arm, Patient Position: Sitting, Cuff Size: Normal)   Pulse (!) 58   Temp 98.2 F (36.8 C) (Oral)   Resp 16   Wt 202 lb 11.2 oz (91.9 kg)   BMI 29.08 kg/m  BP Readings from Last 3 Encounters:  08/11/20 (!) 160/81  02/06/20  138/76  02/05/20 138/70   Wt Readings from Last 3 Encounters:  08/11/20 202 lb 11.2 oz (91.9 kg)  02/06/20 197 lb (89.4 kg)  02/05/20 201 lb 12.8 oz (91.5 kg)      Physical Exam Vitals reviewed.  Constitutional:      General: He is not in acute distress.    Appearance: Normal appearance. He is well-developed, well-groomed and overweight. He is not ill-appearing or diaphoretic.  HENT:     Head: Normocephalic and atraumatic.     Right Ear: Tympanic membrane, ear canal and external ear normal. There is impacted cerumen.     Left Ear: Tympanic membrane, ear canal and external ear normal. There is impacted cerumen.  Cardiovascular:  Rate and Rhythm: Normal rate and regular rhythm.     Pulses: Normal pulses.     Heart sounds: Murmur heard.  Systolic murmur is present with a grade of 3/6.  No friction rub. No gallop.   Pulmonary:     Effort: Pulmonary effort is normal. No respiratory distress.     Breath sounds: Normal breath sounds. No wheezing or rales.  Musculoskeletal:     Cervical back: Normal range of motion and neck supple.  Neurological:     Mental Status: He is alert.  Psychiatric:        Behavior: Behavior is cooperative.       Results for orders placed or performed in visit on 08/11/20  POCT glycosylated hemoglobin (Hb A1C)  Result Value Ref Range   Hemoglobin A1C 6.4 (A) 4.0 - 5.6 %   Est. average glucose Bld gHb Est-mCnc 137     Assessment & Plan     1. Type 2 diabetes mellitus with diabetic cataract, without long-term current use of insulin (HCC) A1c stable at 6.4. Continue Metformin 500mg  daily. F/U in 6 months for AWV/CPE.  2. Need for immunization against influenza Flu vaccine given today without complication. Patient sat upright for 15 minutes to check for adverse reaction before being released. - Flu Vaccine QUAD High Dose(Fluad)  3. Bilateral impacted cerumen Ear lavage successful. - Ear Lavage   Return in about 6 months (around 02/08/2021)  for CPE.      Reynolds Bowl, PA-C, have reviewed all documentation for this visit. The documentation on 08/11/20 for the exam, diagnosis, procedures, and orders are all accurate and complete.   Rubye Beach  [facility] [phone] (phone) [phone] (fax)  [place]

## 2020-08-11 ENCOUNTER — Other Ambulatory Visit: Payer: Self-pay

## 2020-08-11 ENCOUNTER — Encounter: Payer: Self-pay | Admitting: Physician Assistant

## 2020-08-11 ENCOUNTER — Ambulatory Visit (INDEPENDENT_AMBULATORY_CARE_PROVIDER_SITE_OTHER): Payer: Medicare Other | Admitting: Physician Assistant

## 2020-08-11 VITALS — BP 160/81 | HR 58 | Temp 98.2°F | Resp 16 | Wt 202.7 lb

## 2020-08-11 DIAGNOSIS — Z23 Encounter for immunization: Secondary | ICD-10-CM

## 2020-08-11 DIAGNOSIS — H6123 Impacted cerumen, bilateral: Secondary | ICD-10-CM

## 2020-08-11 DIAGNOSIS — E1136 Type 2 diabetes mellitus with diabetic cataract: Secondary | ICD-10-CM

## 2020-08-11 LAB — POCT GLYCOSYLATED HEMOGLOBIN (HGB A1C)
Est. average glucose Bld gHb Est-mCnc: 137
Hemoglobin A1C: 6.4 % — AB (ref 4.0–5.6)

## 2020-08-12 ENCOUNTER — Ambulatory Visit: Payer: Self-pay | Admitting: Physician Assistant

## 2020-08-22 ENCOUNTER — Other Ambulatory Visit: Payer: Self-pay | Admitting: Physician Assistant

## 2020-08-22 DIAGNOSIS — E1136 Type 2 diabetes mellitus with diabetic cataract: Secondary | ICD-10-CM

## 2020-08-22 DIAGNOSIS — I10 Essential (primary) hypertension: Secondary | ICD-10-CM

## 2020-08-22 NOTE — Telephone Encounter (Signed)
°  Requested Prescriptions  Pending Prescriptions Disp Refills   lisinopril-hydrochlorothiazide (ZESTORETIC) 20-25 MG tablet [Pharmacy Med Name: LISINOPRIL-HCTZ 20-25 MG TAB] 90 tablet 1    Sig: TAKE ONE TABLET BY MOUTH EVERY DAY FOR BLOOD PRESSURE AND KIDNEY PROTECTION     Cardiovascular:  ACEI + Diuretic Combos Failed - 08/22/2020 10:29 AM      Failed - Na in normal range and within 180 days    Sodium  Date Value Ref Range Status  02/06/2020 140 134 - 144 mmol/L Final         Failed - K in normal range and within 180 days    Potassium  Date Value Ref Range Status  02/06/2020 4.1 3.5 - 5.2 mmol/L Final         Failed - Cr in normal range and within 180 days    Creatinine, Ser  Date Value Ref Range Status  02/06/2020 0.85 0.76 - 1.27 mg/dL Final         Failed - Ca in normal range and within 180 days    Calcium  Date Value Ref Range Status  02/06/2020 9.6 8.6 - 10.2 mg/dL Final         Failed - Last BP in normal range    BP Readings from Last 1 Encounters:  08/11/20 (!) 160/81         Passed - Patient is not pregnant      Passed - Valid encounter within last 6 months    Recent Outpatient Visits          1 week ago Type 2 diabetes mellitus with diabetic cataract, without long-term current use of insulin ([place])   [facility] [place], [place], PA-C   6 months ago Type 2 diabetes mellitus with diabetic cataract, without long-term current use of insulin [facility])   [place], [place], [place]   10 months ago Gastroesophageal reflux disease without esophagitis   [facility] [place], [place], PA-C   1 year ago Type 2 diabetes mellitus with diabetic cataract, without long-term current use of insulin [facility])   [place], [place], [place]   1 year ago Acute left-sided low back pain without sciatica   [place], [place], [place]      Future Appointments             In 2 months Ralene Bathe, MD [place]

## 2020-09-12 IMAGING — RF DG SWALLOWING FUNCTION
1 series · 1 of 1 positions shown · non-contrast
Comparison: No prior.

CLINICAL DATA: Dysphagia.

EXAM:
MODIFIED BARIUM SWALLOW
TECHNIQUE: Different consistencies of barium were administered orally to the
patient by the Speech Pathologist. Imaging of the pharynx was
performed in the lateral projection. The radiologist was present in
the fluoroscopy room for this study, providing personal supervision.
FLUOROSCOPY TIME:  Fluoroscopy Time:  1 minutes 48 seconds
Radiation Exposure Index (if provided by the fluoroscopic device):
12.1 mGy

[Series 1: cp_standard · 0.17mm/px · 1 of 1 slices shown]
[im 1/1]
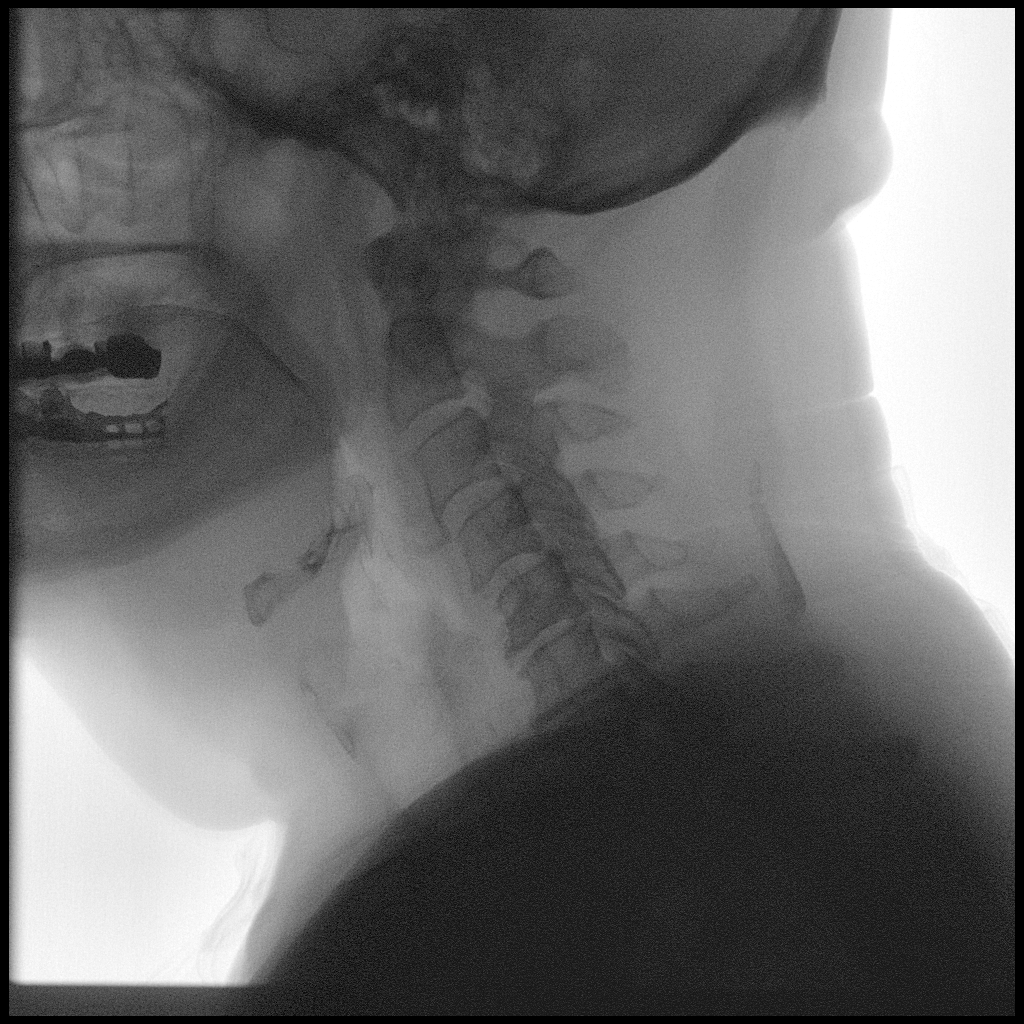

[1 of 1 positions shown; findings below may reference images not displayed]

FINDINGS: Fluoroscopy was performed for modified barium swallow. Number no
complications. No significant aspiration identified. Please refer to
speech pathology report for complete details and recommendations.
Carotid vascular calcification.
IMPRESSION: Please refer to the Speech Pathologists report for complete details
and recommendations.

## 2020-10-06 ENCOUNTER — Other Ambulatory Visit: Payer: Self-pay | Admitting: Physician Assistant

## 2020-10-06 NOTE — Telephone Encounter (Signed)
Requested medication (s) are due for refill today - yes  Requested medication (s) are on the active medication list -yes  Future visit scheduled -no  Last refill: 09/06/20  Notes to clinic: Patient request RF of medication prescribed by historical provider- he is up to date on appointments  Requested Prescriptions  Pending Prescriptions Disp Refills   metFORMIN (GLUCOPHAGE) 500 MG tablet [Pharmacy Med Name: METFORMIN HCL 500 MG TABLET] 45 tablet     Sig: TAKE 1 AND 1/2 TABLETS BY MOUTH DAILY*NEED APPT FOR MORE REFILLS      Endocrinology:  Diabetes - Biguanides Passed - 10/06/2020  3:01 PM      Passed - Cr in normal range and within 360 days    Creatinine, Ser  Date Value Ref Range Status  02/06/2020 0.85 0.76 - 1.27 mg/dL Final          Passed - HBA1C is between 0 and 7.9 and within 180 days    Hemoglobin A1C  Date Value Ref Range Status  08/11/2020 6.4 (A) 4.0 - 5.6 % Final   Hgb A1c MFr Bld  Date Value Ref Range Status  02/06/2019 6.2 (H) 4.8 - 5.6 % Final    Comment:             Prediabetes: 5.7 - 6.4          Diabetes: >6.4          Glycemic control for adults with diabetes: <7.0           Passed - eGFR in normal range and within 360 days    GFR calc Af Amer  Date Value Ref Range Status  02/06/2020 96 >59 mL/min/1.73 Final    Comment:    **[facility] currently reports eGFR in compliance with the current**   recommendations of the [employer]. [facility] will   update reporting as new guidelines are published from the NKF-ASN   Task force.    GFR calc non Af Amer  Date Value Ref Range Status  02/06/2020 83 >59 mL/min/1.73 Final          Passed - Valid encounter within last 6 months    Recent Outpatient Visits           1 month ago Type 2 diabetes mellitus with diabetic cataract, without long-term current use of insulin [facility])   [facility] [place], [place], PA-C   8 months ago Type 2 diabetes mellitus with diabetic cataract,  without long-term current use of insulin [facility])   [place], [place]   12 months ago Gastroesophageal reflux disease without esophagitis   [facility] [place], [place], PA-C   1 year ago Type 2 diabetes mellitus with diabetic cataract, without long-term current use of insulin [facility])   [place], [place], [place]   1 year ago Acute left-sided low back pain without sciatica   [place], [place], [place]       Future Appointments             In 1 month Nehemiah Massed Monia Sabal, MD [place]                 Requested Prescriptions  Pending Prescriptions Disp Refills   metFORMIN (GLUCOPHAGE) 500 MG tablet [Pharmacy Med Name: METFORMIN HCL 500 MG TABLET] 45 tablet     Sig: TAKE 1 AND 1/2 TABLETS BY MOUTH DAILY*NEED APPT FOR MORE REFILLS  Endocrinology:  Diabetes - Biguanides Passed - 10/06/2020  3:01 PM      Passed - Cr in normal range and within 360 days    Creatinine, Ser  Date Value Ref Range Status  02/06/2020 0.85 0.76 - 1.27 mg/dL Final          Passed - HBA1C is between 0 and 7.9 and within 180 days    Hemoglobin A1C  Date Value Ref Range Status  08/11/2020 6.4 (A) 4.0 - 5.6 % Final   Hgb A1c MFr Bld  Date Value Ref Range Status  02/06/2019 6.2 (H) 4.8 - 5.6 % Final    Comment:             Prediabetes: 5.7 - 6.4          Diabetes: >6.4          Glycemic control for adults with diabetes: <7.0           Passed - eGFR in normal range and within 360 days    GFR calc Af Amer  Date Value Ref Range Status  02/06/2020 96 >59 mL/min/1.73 Final    Comment:    **[facility] currently reports eGFR in compliance with the current**   recommendations of the [employer]. [facility] will   update reporting as new guidelines are published from the NKF-ASN   Task force.    GFR calc non Af Amer  Date Value Ref Range Status  02/06/2020 83 >59  mL/min/1.73 Final          Passed - Valid encounter within last 6 months    Recent Outpatient Visits           1 month ago Type 2 diabetes mellitus with diabetic cataract, without long-term current use of insulin [facility])   [facility] [place], [place], PA-C   8 months ago Type 2 diabetes mellitus with diabetic cataract, without long-term current use of insulin [facility])   [place], [place]   12 months ago Gastroesophageal reflux disease without esophagitis   [facility] [place], [place], PA-C   1 year ago Type 2 diabetes mellitus with diabetic cataract, without long-term current use of insulin [facility])   [place], [place]   1 year ago Acute left-sided low back pain without sciatica   [facility], [place], [place]       Future Appointments             In 1 month Ralene Bathe, MD [place]

## 2020-11-11 ENCOUNTER — Ambulatory Visit (INDEPENDENT_AMBULATORY_CARE_PROVIDER_SITE_OTHER): Payer: Medicare Other | Admitting: Dermatology

## 2020-11-11 ENCOUNTER — Other Ambulatory Visit: Payer: Self-pay

## 2020-11-11 ENCOUNTER — Encounter: Payer: Self-pay | Admitting: Dermatology

## 2020-11-11 DIAGNOSIS — D229 Melanocytic nevi, unspecified: Secondary | ICD-10-CM

## 2020-11-11 DIAGNOSIS — L821 Other seborrheic keratosis: Secondary | ICD-10-CM

## 2020-11-11 DIAGNOSIS — L570 Actinic keratosis: Secondary | ICD-10-CM | POA: Diagnosis not present

## 2020-11-11 DIAGNOSIS — Z1283 Encounter for screening for malignant neoplasm of skin: Secondary | ICD-10-CM | POA: Diagnosis not present

## 2020-11-11 DIAGNOSIS — D1800 Hemangioma unspecified site: Secondary | ICD-10-CM

## 2020-11-11 DIAGNOSIS — L814 Other melanin hyperpigmentation: Secondary | ICD-10-CM

## 2020-11-11 DIAGNOSIS — L98499 Non-pressure chronic ulcer of skin of other sites with unspecified severity: Secondary | ICD-10-CM | POA: Diagnosis not present

## 2020-11-11 DIAGNOSIS — L578 Other skin changes due to chronic exposure to nonionizing radiation: Secondary | ICD-10-CM

## 2020-11-11 NOTE — Patient Instructions (Signed)

## 2020-11-11 NOTE — Progress Notes (Signed)
   Follow-Up Visit   Subjective  Joseph Cook is a 80 y.o. male who presents for the following: Annual Exam (History of BCC, AK, Dysplastic nevus - UBSE today). The patient presents for Upper Body Skin Exam (UBSE) for skin cancer screening and mole check.  The following portions of the chart were reviewed this encounter and updated as appropriate:   Tobacco  Allergies  Meds  Problems  Med Hx  Surg Hx  Fam Hx     Review of Systems:  No other skin or systemic complaints except as noted in HPI or Assessment and Plan.  Objective  Well appearing patient in no apparent distress; mood and affect are within normal limits.  All skin waist up examined.  Objective  Left scalp x , hands x 6 (7): Erythematous thin papules/macules with gritty scale.   Objective  Right Ear: Crust   Assessment & Plan  AK (actinic keratosis) (7) Left scalp x , hands x 6  Destruction of lesion - Left scalp x , hands x 6 Complexity: simple   Destruction method: cryotherapy   Informed consent: discussed and consent obtained   Timeout:  patient name, date of birth, surgical site, and procedure verified Lesion destroyed using liquid nitrogen: Yes   Region frozen until ice ball extended beyond lesion: Yes   Outcome: patient tolerated procedure well with no complications   Post-procedure details: wound care instructions given    Ulcer of external ear, possibly trauma related Right Ear Recheck on follow . May consider biopsy if persistent.   Lentigines - Scattered tan macules - Discussed due to sun exposure - Benign, observe - Call for any changes  Seborrheic Keratoses - Stuck-on, waxy, tan-brown papules and plaques  - Discussed benign etiology and prognosis. - Observe - Call for any changes  Melanocytic Nevi - Tan-brown and/or pink-flesh-colored symmetric macules and papules - Benign appearing on exam today - Observation - Call clinic for new or changing moles - Recommend daily use of broad  spectrum spf 30+ sunscreen to sun-exposed areas.   Hemangiomas - Red papules - Discussed benign nature - Observe - Call for any changes  Actinic Damage - Chronic, secondary to cumulative UV/sun exposure - diffuse scaly erythematous macules with underlying dyspigmentation - Recommend daily broad spectrum sunscreen SPF 30+ to sun-exposed areas, reapply every 2 hours as needed.  - Call for new or changing lesions.  Skin cancer screening performed today.  Return in about 6 months (around 05/11/2021).  I, Ashok Cordia, CMA, am acting as scribe for Sarina Ser, MD .  Documentation: I have reviewed the above documentation for accuracy and completeness, and I agree with the above.  Sarina Ser, MD

## 2020-11-14 ENCOUNTER — Encounter: Payer: Self-pay | Admitting: Dermatology

## 2020-11-28 ENCOUNTER — Other Ambulatory Visit: Payer: Self-pay | Admitting: Physician Assistant

## 2020-11-28 NOTE — Telephone Encounter (Signed)
Requested Prescriptions  Pending Prescriptions Disp Refills  . tamsulosin (FLOMAX) 0.4 MG CAPS capsule [Pharmacy Med Name: TAMSULOSIN HCL 0.4 MG CAPSULE] 90 capsule 1    Sig: TAKE 1 CAPSULE BY MOUTH EVERY DAY     Urology: Alpha-Adrenergic Blocker Failed - 11/28/2020  8:16 AM      Failed - Last BP in normal range    BP Readings from Last 1 Encounters:  08/11/20 (!) 160/81         Passed - Valid encounter within last 12 months    Recent Outpatient Visits          3 months ago Type 2 diabetes mellitus with diabetic cataract, without long-term current use of insulin [facility])   [facility] [place], [place], [place]   9 months ago Type 2 diabetes mellitus with diabetic cataract, without long-term current use of insulin [facility])   [facility] [place], [place], [place]   1 year ago Gastroesophageal reflux disease without esophagitis   [place], [place], PA-C   1 year ago Type 2 diabetes mellitus with diabetic cataract, without long-term current use of insulin [facility])   [place], [place]   1 year ago Acute left-sided low back pain without sciatica   [facility] [place], [place], [place]      Future Appointments            In 5 months Ralene Bathe, MD [place]

## 2020-12-31 ENCOUNTER — Other Ambulatory Visit: Payer: Self-pay | Admitting: Physician Assistant

## 2020-12-31 NOTE — Telephone Encounter (Signed)
Requested medications are due for refill today.  yes  Requested medications are on the active medications list.  yes  Last refill. 10/07/2020  Future visit scheduled.   yes  Notes to clinic.  Rx signed by Fenton Malling.

## 2021-01-12 ENCOUNTER — Other Ambulatory Visit: Payer: Self-pay | Admitting: Physician Assistant

## 2021-01-12 DIAGNOSIS — R198 Other specified symptoms and signs involving the digestive system and abdomen: Secondary | ICD-10-CM

## 2021-01-12 DIAGNOSIS — R0989 Other specified symptoms and signs involving the circulatory and respiratory systems: Secondary | ICD-10-CM

## 2021-01-31 ENCOUNTER — Other Ambulatory Visit: Payer: Self-pay | Admitting: Physician Assistant

## 2021-01-31 DIAGNOSIS — E7800 Pure hypercholesterolemia, unspecified: Secondary | ICD-10-CM

## 2021-01-31 IMAGING — CR DG FOOT COMPLETE 3+V*L*
1 series · 3 of 3 positions shown · non-contrast
Comparison: None.

CLINICAL DATA: Left midfoot pain

EXAM:
LEFT FOOT - COMPLETE 3+ VIEW

[Series 1: dg foot complete left · 0.14mm/px · 3 of 3 slices shown]
[im 1/3]
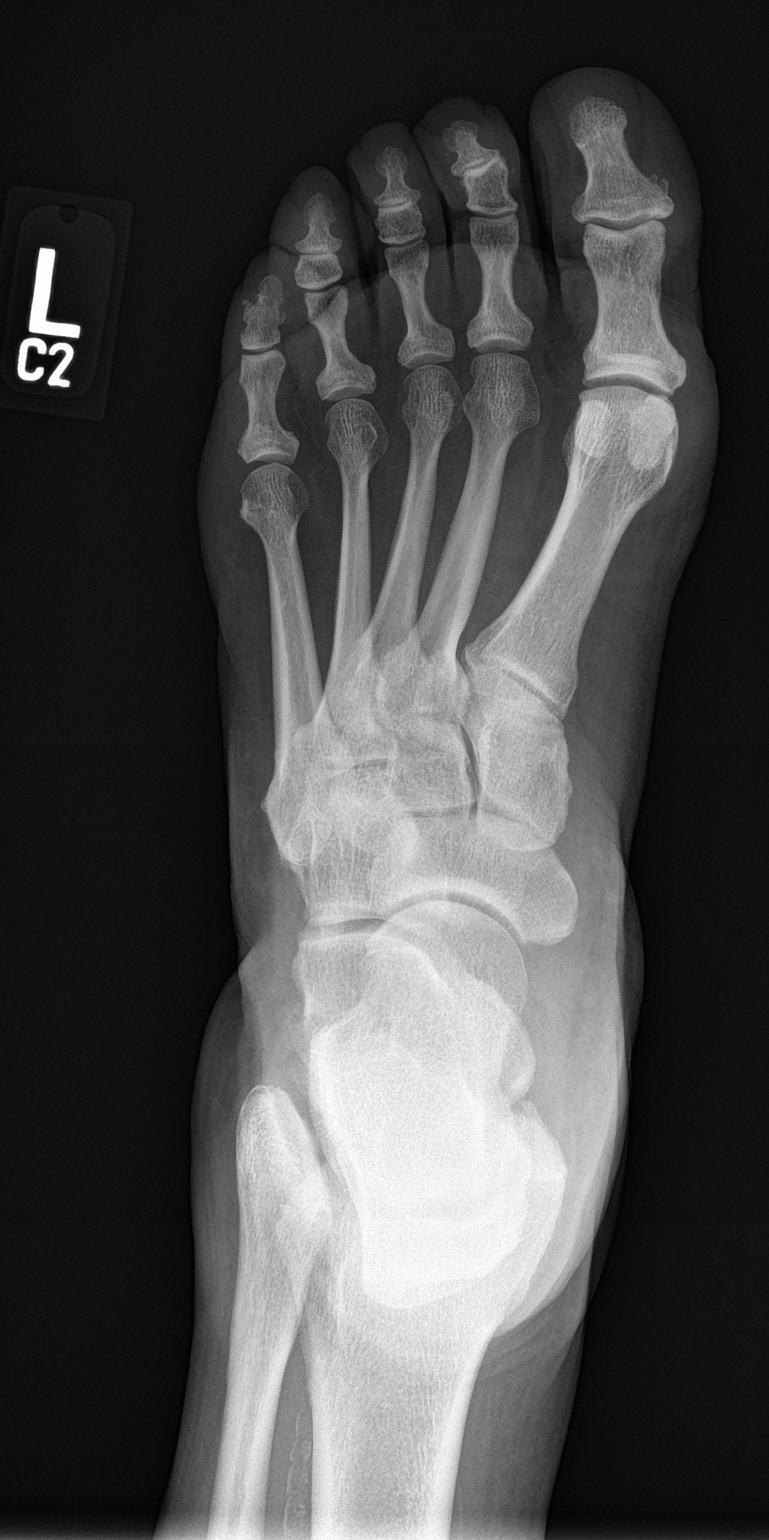
[im 2/3]
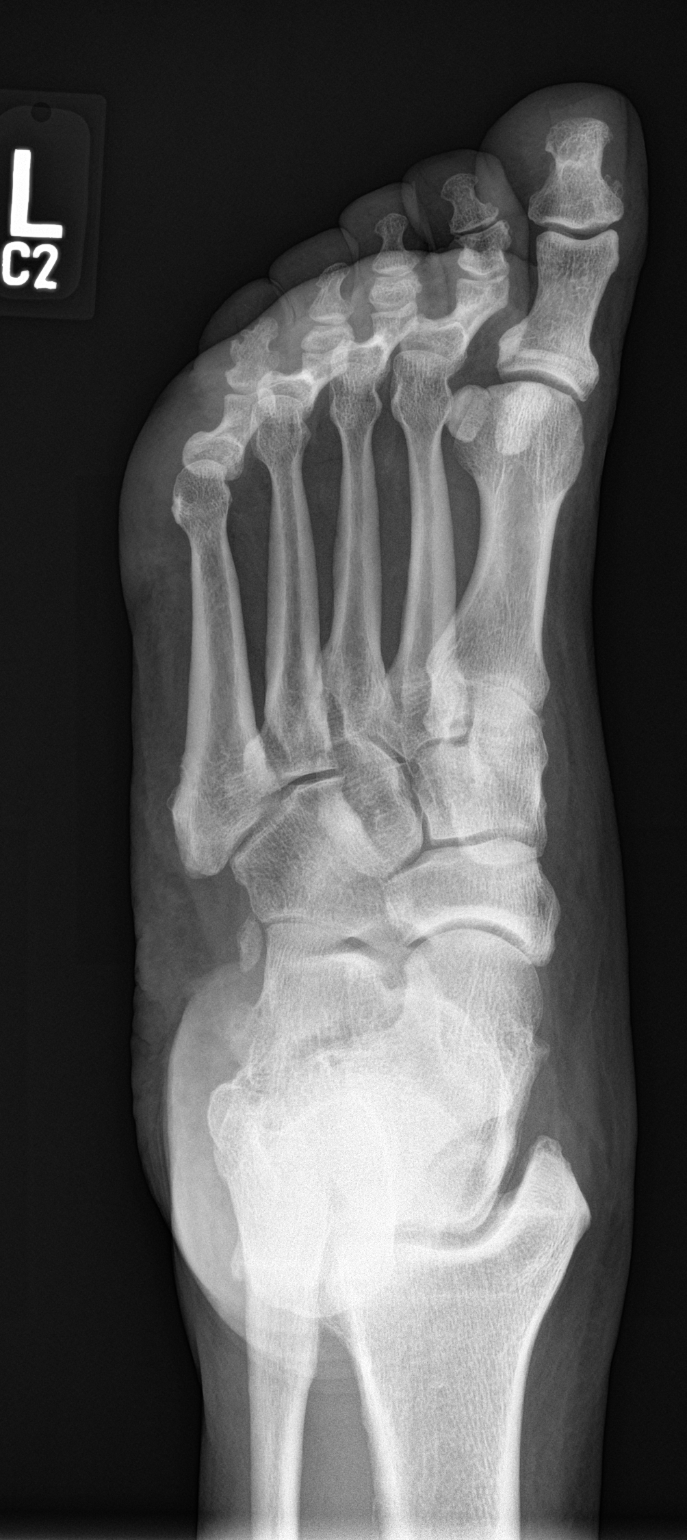
[im 3/3]
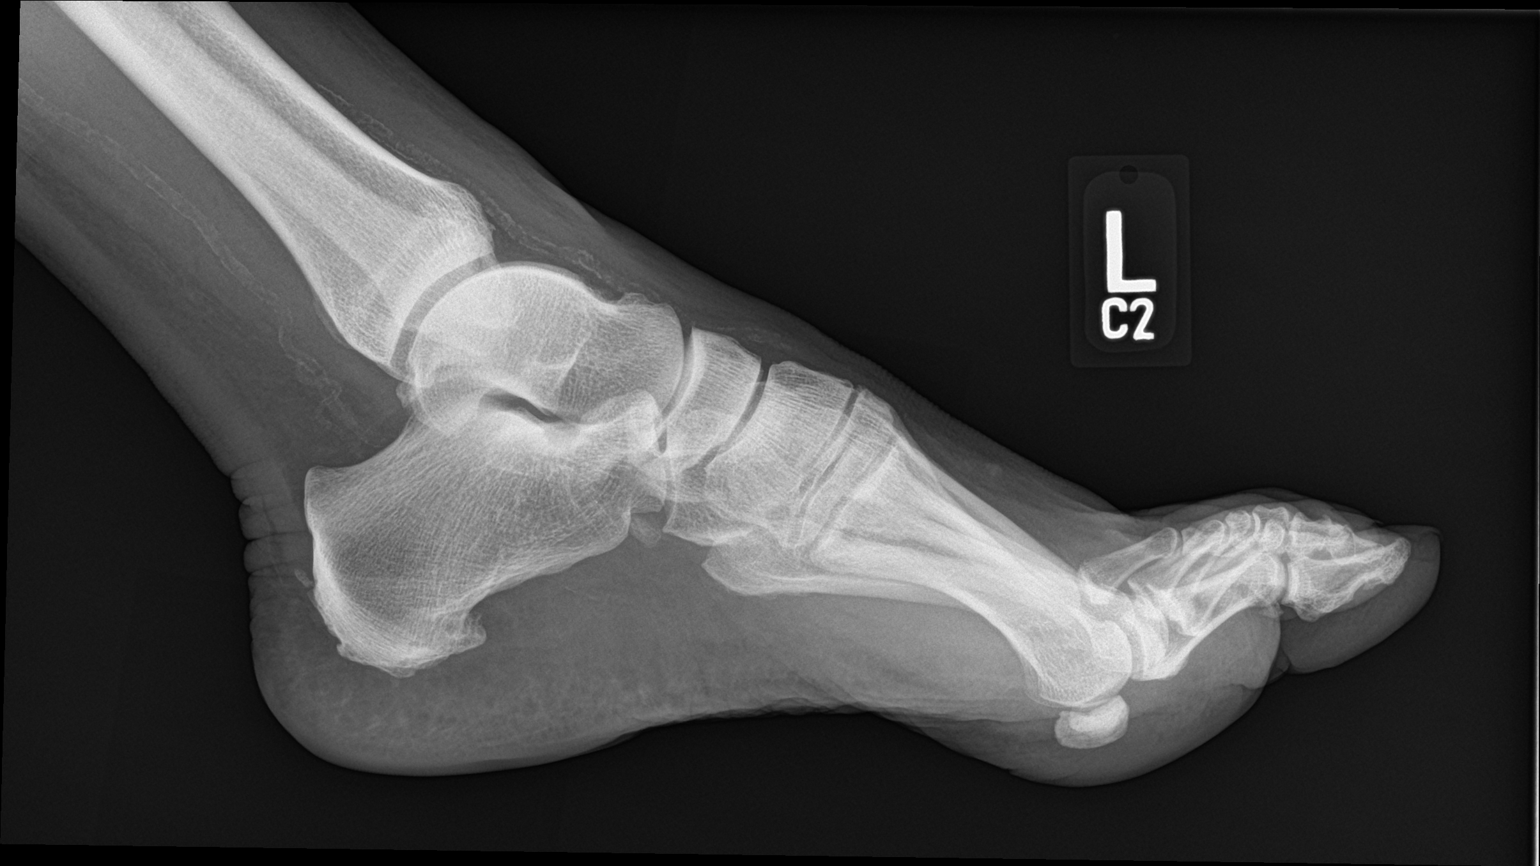

[3 of 3 positions shown; findings below may reference images not displayed]

FINDINGS: Alignment is anatomic. No acute fracture. There is no periosteal
reaction. Joint spaces are preserved. Vascular calcification is
present.
IMPRESSION: No significant osseous abnormality.

## 2021-02-09 ENCOUNTER — Encounter: Payer: Medicare Other | Admitting: Physician Assistant

## 2021-02-16 ENCOUNTER — Other Ambulatory Visit: Payer: Self-pay | Admitting: Physician Assistant

## 2021-02-16 DIAGNOSIS — I10 Essential (primary) hypertension: Secondary | ICD-10-CM

## 2021-02-16 DIAGNOSIS — E1136 Type 2 diabetes mellitus with diabetic cataract: Secondary | ICD-10-CM

## 2021-02-18 ENCOUNTER — Other Ambulatory Visit: Payer: Self-pay

## 2021-02-18 ENCOUNTER — Ambulatory Visit (INDEPENDENT_AMBULATORY_CARE_PROVIDER_SITE_OTHER): Payer: Medicare Other | Admitting: Family Medicine

## 2021-02-18 ENCOUNTER — Encounter: Payer: Self-pay | Admitting: Family Medicine

## 2021-02-18 VITALS — BP 135/74 | HR 67 | Temp 97.3°F | Resp 18 | Ht 70.0 in | Wt 203.8 lb

## 2021-02-18 DIAGNOSIS — Z125 Encounter for screening for malignant neoplasm of prostate: Secondary | ICD-10-CM

## 2021-02-18 DIAGNOSIS — E7800 Pure hypercholesterolemia, unspecified: Secondary | ICD-10-CM

## 2021-02-18 DIAGNOSIS — I10 Essential (primary) hypertension: Secondary | ICD-10-CM

## 2021-02-18 DIAGNOSIS — Z0000 Encounter for general adult medical examination without abnormal findings: Secondary | ICD-10-CM

## 2021-02-18 DIAGNOSIS — E1136 Type 2 diabetes mellitus with diabetic cataract: Secondary | ICD-10-CM | POA: Diagnosis not present

## 2021-02-18 DIAGNOSIS — R011 Cardiac murmur, unspecified: Secondary | ICD-10-CM

## 2021-02-18 DIAGNOSIS — K219 Gastro-esophageal reflux disease without esophagitis: Secondary | ICD-10-CM

## 2021-02-18 LAB — POCT GLYCOSYLATED HEMOGLOBIN (HGB A1C)
Est. average glucose Bld gHb Est-mCnc: 143
Hemoglobin A1C: 6.6 % — AB (ref 4.0–5.6)

## 2021-02-18 NOTE — Progress Notes (Signed)
Annual Wellness Visit     Patient: Joseph Cook, Male    DOB: 04/27/1941, 80 y.o.   MRN: [id number] Visit Date: 02/18/2021  Today's Provider: Lelon Huh, MD   Chief Complaint  Patient presents with  . Medicare Wellness  . Hyperlipidemia  . Hypertension  . Diabetes   Subjective    Joseph Cook is a 80 y.o. male who presents today for his Annual Wellness Visit. He reports consuming a general diet. Home exercise routine includes walking and golfing. He generally feels fairly well. He reports sleeping fairly well. He does not have additional problems to discuss today.   HPI Diabetes Mellitus Type II, Follow-up  Lab Results  Component Value Date   HGBA1C 6.4 (A) 08/11/2020   HGBA1C 6.4 (A) 02/06/2020   HGBA1C 6.6 (A) 08/11/2019   Wt Readings from Last 3 Encounters:  02/18/21 203 lb 12.8 oz (92.4 kg)  08/11/20 202 lb 11.2 oz (91.9 kg)  02/06/20 197 lb (89.4 kg)   Last seen for diabetes 6 months ago (seen by Fenton Malling, PA-C).  Management since then includes continuing same medication. He reports good compliance with treatment. He is not having side effects.  Symptoms: No fatigue No foot ulcerations  No appetite changes No nausea  No paresthesia of the feet  No polydipsia  No polyuria No visual disturbances   No vomiting     Home blood sugar records: fasting range: 95-150  Episodes of hypoglycemia? No    Current insulin regiment: none Most Recent Eye Exam: not UTD Current exercise: walking Current diet habits: well balanced  Pertinent Labs: Lab Results  Component Value Date   CHOL 169 02/06/2020   HDL 35 (L) 02/06/2020   LDLCALC 111 (H) 02/06/2020   TRIG 125 02/06/2020   CHOLHDL 3.0 02/06/2019   Lab Results  Component Value Date   NA 140 02/06/2020   K 4.1 02/06/2020   CREATININE 0.85 02/06/2020   GFRNONAA 83 02/06/2020   GFRAA 96 02/06/2020   GLUCOSE 111 (H) 02/06/2020      ---------------------------------------------------------------------------------------------------  Lipid/Cholesterol, Follow-up  Last lipid panel Other pertinent labs  Lab Results  Component Value Date   CHOL 169 02/06/2020   HDL 35 (L) 02/06/2020   LDLCALC 111 (H) 02/06/2020   TRIG 125 02/06/2020   CHOLHDL 3.0 02/06/2019   Lab Results  Component Value Date   ALT 17 02/06/2020   AST 12 02/06/2020   PLT 171 02/06/2020   TSH 2.360 02/06/2019     He was last seen for this 1 year ago.  Management since that visit includes continue same medication.  He reports good compliance with treatment. He is not having side effects.   Symptoms: No chest pain No chest pressure/discomfort  No dyspnea No lower extremity edema  No numbness or tingling of extremity No orthopnea  No palpitations No paroxysmal nocturnal dyspnea  No speech difficulty No syncope   Current diet: well balanced Current exercise: walking  The 10-year ASCVD risk score Mikey Bussing DC Jr., et al., 2013) is: 62.5%  ---------------------------------------------------------------------------------------------------  Hypertension, follow-up  BP Readings from Last 3 Encounters:  02/18/21 135/74  08/11/20 (!) 160/81  02/06/20 138/76   Wt Readings from Last 3 Encounters:  02/18/21 203 lb 12.8 oz (92.4 kg)  08/11/20 202 lb 11.2 oz (91.9 kg)  02/06/20 197 lb (89.4 kg)     He was last seen for hypertension 1 year ago.  BP at that visit was 138/76. Management since that visit includes  continue same medication.  He reports good compliance with treatment. He is not having side effects.  He is following a Regular diet. He is exercising. He does not smoke.  Use of agents associated with hypertension: NSAIDS.   Outside blood pressures are not checked. Symptoms: No chest pain No chest pressure  No palpitations No syncope  No dyspnea No orthopnea  No paroxysmal nocturnal dyspnea No lower extremity edema    Pertinent labs: Lab Results  Component Value Date   CHOL 169 02/06/2020   HDL 35 (L) 02/06/2020   LDLCALC 111 (H) 02/06/2020   TRIG 125 02/06/2020   CHOLHDL 3.0 02/06/2019   Lab Results  Component Value Date   NA 140 02/06/2020   K 4.1 02/06/2020   CREATININE 0.85 02/06/2020   GFRNONAA 83 02/06/2020   GFRAA 96 02/06/2020   GLUCOSE 111 (H) 02/06/2020     The 10-year ASCVD risk score Mikey Bussing DC Jr., et al., 2013) is: 62.5%   ---------------------------------------------------------------------------------------------------     Medications: Outpatient Medications Prior to Visit  Medication Sig  . aspirin 325 MG tablet Take 325 mg by mouth daily.  Marland Kitchen atorvastatin (LIPITOR) 10 MG tablet TAKE 1 TABLET BY MOUTH EVERYDAY AT BEDTIME  . Cholecalciferol (VITAMIN D3) 50 MCG (2000 UT) TABS Take 2,000 Units by mouth daily.   Marland Kitchen lisinopril-hydrochlorothiazide (ZESTORETIC) 20-25 MG tablet TAKE ONE TABLET BY MOUTH EVERY DAY FOR BLOOD PRESSURE AND KIDNEY PROTECTION  . metFORMIN (GLUCOPHAGE) 500 MG tablet Take 1.5 tablets (750 mg total) by mouth daily with breakfast.  . metoprolol succinate (TOPROL-XL) 50 MG 24 hr tablet Take 1 tablet (50 mg total) by mouth daily.  . Multiple Vitamins-Minerals (PRESERVISION AREDS 2 PO) Take 1 capsule by mouth 2 (two) times daily.   Marland Kitchen omeprazole (PRILOSEC) 40 MG capsule TAKE 1 CAPSULE BY MOUTH EVERY DAY  . tamsulosin (FLOMAX) 0.4 MG CAPS capsule TAKE 1 CAPSULE BY MOUTH EVERY DAY   No facility-administered medications prior to visit.    No Known Allergies  Patient Care Team: Mar Daring, PA-C as PCP - General (Family Medicine) [facility], [facility]) Ralene Bathe, MD (Dermatology)  Review of Systems  Constitutional: Negative for appetite change, chills, fatigue and fever.  HENT: Negative for congestion, ear pain, hearing loss, nosebleeds and trouble swallowing.   Eyes: Negative for pain and visual disturbance.  Respiratory:  Negative for cough, chest tightness and shortness of breath.   Cardiovascular: Negative for chest pain, palpitations and leg swelling.  Gastrointestinal: Negative for abdominal pain, blood in stool, constipation, diarrhea, nausea and vomiting.  Endocrine: Negative for polydipsia, polyphagia and polyuria.  Genitourinary: Negative for dysuria and flank pain.  Musculoskeletal: Negative for arthralgias, back pain, joint swelling, myalgias and neck stiffness.  Skin: Negative for color change, rash and wound.  Neurological: Negative for dizziness, tremors, seizures, speech difficulty, weakness, light-headedness and headaches.  Psychiatric/Behavioral: Negative for behavioral problems, confusion, decreased concentration, dysphoric mood and sleep disturbance. The patient is not nervous/anxious.   All other systems reviewed and are negative.       Objective    Vitals: BP 135/74 (BP Location: Left Arm, Patient Position: Sitting, Cuff Size: Normal)   Pulse 67   Temp (!) 97.3 F (36.3 C) (Temporal)   Resp 18   Ht 5\' 10"  (1.778 m)   Wt 203 lb 12.8 oz (92.4 kg)   BMI 29.24 kg/m    Physical Exam  General Appearance:     Overweight male. Alert, cooperative, in no acute  distress, appears stated age  Head:    Normocephalic, without obvious abnormality, atraumatic  Eyes:    PERRL, conjunctiva/corneas clear, EOM's intact, fundi    benign, both eyes       Ears:    Normal TM's and external ear canals, both ears  Nose:   Nares normal, septum midline, mucosa normal, no drainage   or sinus tenderness  Throat:   Lips, mucosa, and tongue normal; teeth and gums normal  Neck:   Supple, symmetrical, trachea midline, no adenopathy;       thyroid:  No enlargement/tenderness/nodules; no carotid   bruit or JVD  Back:     Symmetric, no curvature, ROM normal, no CVA tenderness  Lungs:     Clear to auscultation bilaterally, respirations unlabored  Chest wall:    No tenderness or deformity  Heart:    Normal  heart rate. Normal rhythm.  2/6 systolic murmur at right upper sternal border S1 and S2 normal  Abdomen:     Soft, non-tender, bowel sounds active all four quadrants,    no masses, no organomegaly  Genitalia:    deferred  Rectal:    deferred  Extremities:   All extremities are intact. No cyanosis or edema  Pulses:   2+ and symmetric all extremities  Skin:   Skin color, texture, turgor normal, no rashes or lesions  Lymph nodes:   Cervical, supraclavicular, and axillary nodes normal  Neurologic:   CNII-XII intact. Normal strength, sensation and reflexes      throughout     Most recent functional status assessment: In your present state of health, do you have any difficulty performing the following activities: 02/18/2021  Hearing? N  Vision? N  Difficulty concentrating or making decisions? N  Walking or climbing stairs? N  Dressing or bathing? N  Doing errands, shopping? N  Some recent data might be hidden   Most recent fall risk assessment: Fall Risk  02/18/2021  Falls in the past year? 0  Number falls in past yr: 0  Injury with Fall? 0  Follow up Falls evaluation completed    Most recent depression screenings: PHQ 2/9 Scores 02/18/2021 02/06/2020  PHQ - 2 Score 0 0  PHQ- 9 Score 0 0   Most recent cognitive screening: 6CIT Screen 02/18/2021  What Year? 0 points  What month? 0 points  What time? 0 points  Count back from 20 0 points  Months in reverse 0 points  Repeat phrase 0 points  Total Score 0   Most recent Audit-C alcohol use screening Alcohol Use Disorder Test (AUDIT) 02/18/2021  1. How often do you have a drink containing alcohol? 0  2. How many drinks containing alcohol do you have on a typical day when you are drinking? 0  3. How often do you have six or more drinks on one occasion? 0  AUDIT-C Score 0  Alcohol Brief Interventions/Follow-up -   A score of 3 or more in women, and 4 or more in men indicates increased risk for alcohol abuse, EXCEPT if all of the  points are from question 1   No results found for any visits on 02/18/21.  Assessment & Plan     Annual wellness visit done today including the all of the following: Reviewed patient's Family Medical History Reviewed and updated list of patient's medical providers Assessment of cognitive impairment was done Assessed patient's functional ability Established a written schedule for health screening [place] Completed and Reviewed  Exercise Activities  and Dietary recommendations Goals    . DIET - INCREASE WATER INTAKE     Recommend to drink at least 6-8 8oz glasses of water per day.       Immunization History  Administered Date(s) Administered  . Fluad Quad(high Dose 65+) 08/11/2020  . PFIZER(Purple Top)SARS-COV-2 Vaccination 10/15/2019, 11/05/2019  . Tdap 06/18/2012    Health Maintenance  Topic Date Due  . FOOT EXAM  Never done  . Hepatitis C Screening  Never done  . Zoster Vaccines- Shingrix (1 of 2) Never done  . PNA vac Low Risk Adult (1 of 2 - PCV13) Never done  . COVID-19 Vaccine (3 - Pfizer risk 4-dose series) 12/03/2019  . OPHTHALMOLOGY EXAM  08/12/2020  . HEMOGLOBIN A1C  02/08/2021  . INFLUENZA VACCINE  04/25/2021  . TETANUS/TDAP  06/18/2022  . HPV VACCINES  Aged Out   He reports having had a single pneumonia vaccine and a single shingles vaccine. Advise he is due to have Prevnar (13 or 20) and Shingrix vaccines. He is going to check he medical records first.   Discussed health benefits of physical activity, and encouraged him to engage in regular exercise appropriate for his age and condition.    1. Type 2 diabetes mellitus with diabetic cataract, without long-term current use of insulin ([place]) Fairly well controlled, but is only taking metformin when sugar runs out. Recommended he just take one tablet every day.   2. Primary hypertension Well controlled.  Continue current medications.    3. Pure hypercholesterolemia He is tolerating  atorvastatin well with no adverse effects.   - Lipid panel - Comprehensive metabolic panel  4. Gastroesophageal reflux disease without esophagitis Well controlled.  Continue current medications.    5. Prostate cancer screening  - PSA Total (Reflex To Free) ([facility] only)  6. Systolic murmur  - ECHOCARDIOGRAM COMPLETE; Future    The entirety of the information documented in the History of Present Illness, Review of Systems and Physical Exam were personally obtained by me. Portions of this information were initially documented by the CMA and reviewed by me for thoroughness and accuracy.      Lelon Huh, MD  [facility] [phone] (phone) [phone] (fax)  [place]

## 2021-02-18 NOTE — Patient Instructions (Addendum)
.   Please review the attached list of medications and notify my office if there are any errors.   . Please bring all of your medications to every appointment so we can make sure that our medication list is the same as yours.   . I recommend that you get the Prevnar-20 vaccine to protect yourself from certain strains of pneumonia. Please call our office at [phone] at your earliest convenience to schedule this vaccine.     The CDC recommends two doses of Shingrix (the shingles vaccine) separated by 2 to 6 months for adults age 3 years and older. I recommend checking with your insurance plan regarding coverage for this vaccine.

## 2021-02-19 LAB — CBC
Hematocrit: 43.8 % (ref 37.5–51.0)
Hemoglobin: 14.6 g/dL (ref 13.0–17.7)
MCH: 29.7 pg (ref 26.6–33.0)
MCHC: 33.3 g/dL (ref 31.5–35.7)
MCV: 89 fL (ref 79–97)
Platelets: 162 10*3/uL (ref 150–450)
RBC: 4.91 x10E6/uL (ref 4.14–5.80)
RDW: 13.4 % (ref 11.6–15.4)
WBC: 7.7 10*3/uL (ref 3.4–10.8)

## 2021-02-19 LAB — COMPREHENSIVE METABOLIC PANEL
ALT: 15 IU/L (ref 0–44)
AST: 12 IU/L (ref 0–40)
Albumin/Globulin Ratio: 2.2 (ref 1.2–2.2)
Albumin: 4.7 g/dL (ref 3.7–4.7)
Alkaline Phosphatase: 55 IU/L (ref 44–121)
BUN/Creatinine Ratio: 27 — ABNORMAL HIGH (ref 10–24)
BUN: 25 mg/dL (ref 8–27)
Bilirubin Total: 0.5 mg/dL (ref 0.0–1.2)
CO2: 21 mmol/L (ref 20–29)
Calcium: 9.8 mg/dL (ref 8.6–10.2)
Chloride: 102 mmol/L (ref 96–106)
Creatinine, Ser: 0.92 mg/dL (ref 0.76–1.27)
Globulin, Total: 2.1 g/dL (ref 1.5–4.5)
Glucose: 152 mg/dL — ABNORMAL HIGH (ref 65–99)
Potassium: 4.5 mmol/L (ref 3.5–5.2)
Sodium: 141 mmol/L (ref 134–144)
Total Protein: 6.8 g/dL (ref 6.0–8.5)
eGFR: 85 mL/min/{1.73_m2} (ref >59)

## 2021-02-19 LAB — LIPID PANEL
Chol/HDL Ratio: 3.5 ratio (ref 0.0–5.0)
Cholesterol, Total: 127 mg/dL (ref 100–199)
HDL: 36 mg/dL — ABNORMAL LOW (ref >39)
LDL Chol Calc (NIH): 66 mg/dL (ref 0–99)
Triglycerides: 143 mg/dL (ref 0–149)
VLDL Cholesterol Cal: 25 mg/dL (ref 5–40)

## 2021-02-19 LAB — PSA TOTAL (REFLEX TO FREE): Prostate Specific Ag, Serum: 1 ng/mL (ref 0.0–4.0)

## 2021-02-22 ENCOUNTER — Other Ambulatory Visit: Payer: Self-pay

## 2021-03-17 ENCOUNTER — Ambulatory Visit: Admission: RE | Admit: 2021-03-17 | Payer: Medicare Other | Source: Ambulatory Visit

## 2021-03-26 ENCOUNTER — Other Ambulatory Visit: Payer: Self-pay | Admitting: Physician Assistant

## 2021-03-26 DIAGNOSIS — I10 Essential (primary) hypertension: Secondary | ICD-10-CM

## 2021-04-17 ENCOUNTER — Other Ambulatory Visit: Payer: Self-pay | Admitting: Family Medicine

## 2021-04-17 DIAGNOSIS — R0989 Other specified symptoms and signs involving the circulatory and respiratory systems: Secondary | ICD-10-CM

## 2021-04-17 DIAGNOSIS — R198 Other specified symptoms and signs involving the digestive system and abdomen: Secondary | ICD-10-CM

## 2021-04-17 NOTE — Telephone Encounter (Signed)
Requested Prescriptions  Pending Prescriptions Disp Refills  . omeprazole (PRILOSEC) 40 MG capsule [Pharmacy Med Name: OMEPRAZOLE DR 40 MG CAPSULE] 90 capsule 0    Sig: TAKE 1 CAPSULE BY MOUTH EVERY DAY     Gastroenterology: Proton Pump Inhibitors Passed - 04/17/2021  9:25 AM      Passed - Valid encounter within last 12 months    Recent Outpatient Visits          1 month ago Type 2 diabetes mellitus with diabetic cataract, without long-term current use of insulin ([place])   [facility] Birdie Sons, MD   8 months ago Type 2 diabetes mellitus with diabetic cataract, without long-term current use of insulin [facility])   [place], [place], [place]   1 year ago Type 2 diabetes mellitus with diabetic cataract, without long-term current use of insulin [facility])   [place], [place], [place]   1 year ago Gastroesophageal reflux disease without esophagitis   [facility] [place], [place], PA-C   1 year ago Type 2 diabetes mellitus with diabetic cataract, without long-term current use of insulin [facility])   [facility] [place], Clearnce Sorrel, [place]      Future Appointments            In 3 weeks Ralene Bathe, MD [place]   In 3 months Fisher, Kirstie Peri, MD [facility], [facility]

## 2021-05-12 ENCOUNTER — Other Ambulatory Visit: Payer: Self-pay

## 2021-05-12 ENCOUNTER — Ambulatory Visit (INDEPENDENT_AMBULATORY_CARE_PROVIDER_SITE_OTHER): Payer: Medicare Other | Admitting: Dermatology

## 2021-05-12 DIAGNOSIS — L578 Other skin changes due to chronic exposure to nonionizing radiation: Secondary | ICD-10-CM

## 2021-05-12 DIAGNOSIS — L814 Other melanin hyperpigmentation: Secondary | ICD-10-CM

## 2021-05-12 DIAGNOSIS — L570 Actinic keratosis: Secondary | ICD-10-CM

## 2021-05-12 DIAGNOSIS — L821 Other seborrheic keratosis: Secondary | ICD-10-CM | POA: Diagnosis not present

## 2021-05-12 DIAGNOSIS — L820 Inflamed seborrheic keratosis: Secondary | ICD-10-CM | POA: Diagnosis not present

## 2021-05-12 NOTE — Patient Instructions (Signed)
Cryotherapy Aftercare  . Wash gently with soap and water everyday.   . Apply Vaseline and Band-Aid daily until healed.  

## 2021-05-12 NOTE — Progress Notes (Signed)
Follow-Up Visit   Subjective  Joseph Cook is a 80 y.o. male who presents for the following: Follow-up (6 mo AK f/u. Treated with Ln2 at last visit, scalp x 1 and hands x 6. Recheck ulcer on right ear. Pt states that it is clear today. He has one spot on the back of his head to check today. ).  The following portions of the chart were reviewed this encounter and updated as appropriate:  Tobacco  Allergies  Meds  Problems  Med Hx  Surg Hx  Fam Hx     Review of Systems: No other skin or systemic complaints except as noted in HPI or Assessment and Plan.  Objective  Well appearing patient in no apparent distress; mood and affect are within normal limits.  A focused examination was performed including scalp, right ears, and b/l forearms. Relevant physical exam findings are noted in the Assessment and Plan.  Right Ear and scalp x 7 (7) Erythematous thin papules/macules with gritty scale.   Right Forearm x1 and left forearm x 1 (2) Erythematous keratotic or waxy stuck-on papule or plaque.   Assessment & Plan  AK (actinic keratosis) (7) Right Ear and scalp x 7  Actinic keratoses are precancerous spots that appear secondary to cumulative UV radiation exposure/sun exposure over time. They are chronic with expected duration over 1 year. A portion of actinic keratoses will progress to squamous cell carcinoma of the skin. It is not possible to reliably predict which spots will progress to skin cancer and so treatment is recommended to prevent development of skin cancer.  Recommend daily broad spectrum sunscreen SPF 30+ to sun-exposed areas, reapply every 2 hours as needed.  Recommend staying in the shade or wearing long sleeves, sun glasses (UVA+UVB protection) and wide brim hats (4-inch brim around the entire circumference of the hat). Call for new or changing lesions.  Prior to procedure, discussed risks of blister formation, small wound, skin dyspigmentation, or rare scar following  cryotherapy. Recommend Vaseline ointment to treated areas while healing.   Destruction of lesion - Right Ear and scalp x 7 Complexity: simple   Destruction method: cryotherapy   Informed consent: discussed and consent obtained   Timeout:  patient name, date of birth, surgical site, and procedure verified Lesion destroyed using liquid nitrogen: Yes   Region frozen until ice ball extended beyond lesion: Yes   Outcome: patient tolerated procedure well with no complications   Post-procedure details: wound care instructions given    Inflamed seborrheic keratosis Right Forearm x1 and left forearm x 1  Prior to procedure, discussed risks of blister formation, small wound, skin dyspigmentation, or rare scar following cryotherapy. Recommend Vaseline ointment to treated areas while healing.   Destruction of lesion - Right Forearm x1 and left forearm x 1 Complexity: simple   Destruction method: cryotherapy   Informed consent: discussed and consent obtained   Timeout:  patient name, date of birth, surgical site, and procedure verified Lesion destroyed using liquid nitrogen: Yes   Region frozen until ice ball extended beyond lesion: Yes   Outcome: patient tolerated procedure well with no complications   Post-procedure details: wound care instructions given    Seborrheic Keratoses - Stuck-on, waxy, tan-brown papules and/or plaques  - Benign-appearing - Discussed benign etiology and prognosis. - Observe - Call for any changes  Lentigines - Scattered tan macules - Due to sun exposure - Benign-appering, observe - Recommend daily broad spectrum sunscreen SPF 30+ to sun-exposed areas, reapply every 2 hours  as needed. - Call for any changes  Actinic Damage - chronic, secondary to cumulative UV radiation exposure/sun exposure over time - diffuse scaly erythematous macules with underlying dyspigmentation - Recommend daily broad spectrum sunscreen SPF 30+ to sun-exposed areas, reapply every 2  hours as needed.  - Recommend staying in the shade or wearing long sleeves, sun glasses (UVA+UVB protection) and wide brim hats (4-inch brim around the entire circumference of the hat). - Call for new or changing lesions.  Return in about 6 months (around 11/12/2021) for TBSE.  IHarriett Sine, CMA, am acting as scribe for Sarina Ser, MD. Documentation: I have reviewed the above documentation for accuracy and completeness, and I agree with the above.  Sarina Ser, MD

## 2021-05-16 ENCOUNTER — Encounter: Payer: Self-pay | Admitting: Dermatology

## 2021-05-22 ENCOUNTER — Other Ambulatory Visit: Payer: Self-pay | Admitting: Physician Assistant

## 2021-05-23 NOTE — Telephone Encounter (Signed)
Last office visit: 02/18/2021 Next office visit: 08/08/2021   Last refill given by Fenton Malling, PA-C 11/28/2020 #90, with 1 refill

## 2021-06-24 ENCOUNTER — Other Ambulatory Visit: Payer: Self-pay | Admitting: Family Medicine

## 2021-06-24 NOTE — Telephone Encounter (Signed)
Requested medication (s) are due for refill today: due 07/02/21  Requested medication (s) are on the active medication list: yes  Last refill: 12/31/20  #135  1 refill  Future visit scheduled yes 08/08/21  Notes to clinic: LAst OV 02/18/21. Noted "Recommend pt take only 1 tab daily." Refill request is for 1 1-2 tabs daily. Please review.  Requested Prescriptions  Pending Prescriptions Disp Refills   metFORMIN (GLUCOPHAGE) 500 MG tablet [Pharmacy Med Name: METFORMIN HCL 500 MG TABLET] 135 tablet 1    Sig: TAKE 1 AND 1/2 TABLETS BY MOUTH [place]     Endocrinology:  Diabetes - Biguanides Failed - 06/24/2021  2:00 PM      Failed - Valid encounter within last 6 months    Recent Outpatient Visits           4 months ago Type 2 diabetes mellitus with diabetic cataract, without long-term current use of insulin ([place])   [facility] Birdie Sons, MD   10 months ago Type 2 diabetes mellitus with diabetic cataract, without long-term current use of insulin [facility])   [place], [place], PA-C   1 year ago Type 2 diabetes mellitus with diabetic cataract, without long-term current use of insulin [facility])   [facility] [place], [place], [place]   1 year ago Gastroesophageal reflux disease without esophagitis   [place], [place], PA-C   1 year ago Type 2 diabetes mellitus with diabetic cataract, without long-term current use of insulin [facility])   [place], Cle[place], [place]       Future Appointments             In 1 month Fisher, Kirstie Peri, MD [facility], [facility]   In 10 months Ralene Bathe, MD [place] in normal range and within 360 days    Creatinine, Ser  Date Value Ref Range Status  02/18/2021 0.92 0.76 - 1.27 mg/dL Final          Passed - HBA1C is between 0 and 7.9 and within 180 days    Hemoglobin A1C  Date  Value Ref Range Status  02/18/2021 6.6 (A) 4.0 - 5.6 % Final   Hgb A1c MFr Bld  Date Value Ref Range Status  02/06/2019 6.2 (H) 4.8 - 5.6 % Final    Comment:             Prediabetes: 5.7 - 6.4          Diabetes: >6.4          Glycemic control for adults with diabetes: <7.0           Passed - eGFR in normal range and within 360 days    GFR calc Af Amer  Date Value Ref Range Status  02/06/2020 96 >59 mL/min/1.73 Final    Comment:    **[facility] currently reports eGFR in compliance with the current**   recommendations of the [employer]. [facility] will   update reporting as new guidelines are published from the NKF-ASN   Task force.    GFR calc non Af Amer  Date Value Ref Range Status  02/06/2020 83 >59 mL/min/1.73 Final   eGFR  Date Value Ref Range Status  02/18/2021 85 >59 mL/min/1.73 Final

## 2021-07-16 ENCOUNTER — Other Ambulatory Visit: Payer: Self-pay | Admitting: Family Medicine

## 2021-07-16 DIAGNOSIS — R0989 Other specified symptoms and signs involving the circulatory and respiratory systems: Secondary | ICD-10-CM

## 2021-07-16 NOTE — Telephone Encounter (Signed)
Requested Prescriptions  Pending Prescriptions Disp Refills  . omeprazole (PRILOSEC) 40 MG capsule [Pharmacy Med Name: OMEPRAZOLE DR 40 MG CAPSULE] 90 capsule 0    Sig: TAKE 1 CAPSULE BY MOUTH EVERY DAY     Gastroenterology: Proton Pump Inhibitors Passed - 07/16/2021  7:41 AM      Passed - Valid encounter within last 12 months    Recent Outpatient Visits          4 months ago Type 2 diabetes mellitus with diabetic cataract, without long-term current use of insulin [facility])   [facility] Birdie Sons, MD   11 months ago Type 2 diabetes mellitus with diabetic cataract, without long-term current use of insulin [facility])   [place], [place], [place]   1 year ago Type 2 diabetes mellitus with diabetic cataract, without long-term current use of insulin [facility])   [place], [place], [place]   1 year ago Gastroesophageal reflux disease without esophagitis   [facility] [place], [place], PA-C   1 year ago Type 2 diabetes mellitus with diabetic cataract, without long-term current use of insulin [facility])   [facility] [place], Clearnce [place], [place]      Future Appointments            In 3 weeks Thedore Mins, Ria Comment, PA-C [facility], [place]   In 10 months Ralene Bathe, MD [place]

## 2021-08-04 ENCOUNTER — Other Ambulatory Visit: Payer: Self-pay | Admitting: Family Medicine

## 2021-08-04 DIAGNOSIS — E7800 Pure hypercholesterolemia, unspecified: Secondary | ICD-10-CM

## 2021-08-04 NOTE — Telephone Encounter (Signed)
Requested Prescriptions  Pending Prescriptions Disp Refills  . atorvastatin (LIPITOR) 10 MG tablet [Pharmacy Med Name: ATORVASTATIN 10 MG TABLET] 90 tablet 1    Sig: TAKE 1 TABLET BY MOUTH EVERYDAY AT BEDTIME     Cardiovascular:  Antilipid - Statins Failed - 08/04/2021  4:09 AM      Failed - HDL in normal range and within 360 days    HDL  Date Value Ref Range Status  02/18/2021 36 (L) >39 mg/dL Final         Passed - Total Cholesterol in normal range and within 360 days    Cholesterol, Total  Date Value Ref Range Status  02/18/2021 127 100 - 199 mg/dL Final         Passed - LDL in normal range and within 360 days    LDL Chol Calc (NIH)  Date Value Ref Range Status  02/18/2021 66 0 - 99 mg/dL Final         Passed - Triglycerides in normal range and within 360 days    Triglycerides  Date Value Ref Range Status  02/18/2021 143 0 - 149 mg/dL Final         Passed - Patient is not pregnant      Passed - Valid encounter within last 12 months    Recent Outpatient Visits          5 months ago Type 2 diabetes mellitus with diabetic cataract, without long-term current use of insulin ([place])   [facility] Birdie Sons, MD   11 months ago Type 2 diabetes mellitus with diabetic cataract, without long-term current use of insulin [facility])   [place], [place], PA-C   1 year ago Type 2 diabetes mellitus with diabetic cataract, without long-term current use of insulin [facility])   [place], [place], [place]   1 year ago Gastroesophageal reflux disease without esophagitis   [place], [place], PA-C   1 year ago Type 2 diabetes mellitus with diabetic cataract, without long-term current use of insulin [facility])   [place], Clearnce Sorrel, [place]      Future Appointments            In 1 week Mikey Kirschner, PA-C [employer], [place]   In 9 months Ralene Bathe,  MD [place]

## 2021-08-08 ENCOUNTER — Ambulatory Visit: Payer: Medicare Other | Admitting: Physician Assistant

## 2021-08-12 ENCOUNTER — Ambulatory Visit (INDEPENDENT_AMBULATORY_CARE_PROVIDER_SITE_OTHER): Payer: Medicare Other | Admitting: Physician Assistant

## 2021-08-12 ENCOUNTER — Other Ambulatory Visit: Payer: Self-pay

## 2021-08-12 ENCOUNTER — Encounter: Payer: Self-pay | Admitting: Physician Assistant

## 2021-08-12 VITALS — BP 134/73 | HR 61 | Temp 97.7°F | Ht 70.0 in | Wt 200.1 lb

## 2021-08-12 DIAGNOSIS — Z23 Encounter for immunization: Secondary | ICD-10-CM

## 2021-08-12 DIAGNOSIS — E119 Type 2 diabetes mellitus without complications: Secondary | ICD-10-CM

## 2021-08-12 DIAGNOSIS — E1169 Type 2 diabetes mellitus with other specified complication: Secondary | ICD-10-CM

## 2021-08-12 DIAGNOSIS — I890 Lymphedema, not elsewhere classified: Secondary | ICD-10-CM

## 2021-08-12 DIAGNOSIS — E1159 Type 2 diabetes mellitus with other circulatory complications: Secondary | ICD-10-CM | POA: Diagnosis not present

## 2021-08-12 DIAGNOSIS — E785 Hyperlipidemia, unspecified: Secondary | ICD-10-CM

## 2021-08-12 DIAGNOSIS — I152 Hypertension secondary to endocrine disorders: Secondary | ICD-10-CM

## 2021-08-12 LAB — POCT GLYCOSYLATED HEMOGLOBIN (HGB A1C): Hemoglobin A1C: 6.8 % — AB (ref 4.0–5.6)

## 2021-08-12 MED ORDER — METFORMIN HCL 500 MG PO TABS: 500.0000 mg | ORAL_TABLET | Freq: Two times a day (BID) | ORAL | 2 refills | Status: DC

## 2021-08-12 NOTE — Assessment & Plan Note (Signed)
Hemoglobin A1C Latest Ref Rng & Units 08/12/2021 02/18/2021  HGBA1C 4.0 - 5.6 % 6.8(A) 6.6(A)  Some recent data might be hidden   Discussed increased in A1c despite similar diet w/ pt- we agreed to increase Metformin to 500 mg BID, with breakfast and dinner.  Pt prefers to f/u in 6 months.   Will check urine microal/creat.

## 2021-08-12 NOTE — Assessment & Plan Note (Addendum)
>>  ASSESSMENT AND PLAN FOR HYPERLIPIDEMIA ASSOCIATED WITH TYPE 2 DIABETES MELLITUS (HCC) WRITTEN ON 08/12/2021  9:07 AM BY Jaquala Fuller, PA-C  Chronic and well controlled on low dose Atorvastatin 10 mg. Will recheck lipid panel and CMP  >>ASSESSMENT AND PLAN FOR DIABETES MELLITUS TYPE II, CONTROLLED (HCC) WRITTEN ON 08/12/2021  9:06 AM BY Rudell Ortman, PA-C  Hemoglobin A1C Latest Ref Rng & Units 08/12/2021 02/18/2021  HGBA1C 4.0 - 5.6 % 6.8(A) 6.6(A)  Some recent data might be hidden   Discussed increased in A1c despite similar diet w/ pt- we agreed to increase Metformin to 500 mg BID, with breakfast and dinner.  Pt prefers to f/u in 6 months.   Will check urine microal/creat.

## 2021-08-12 NOTE — Assessment & Plan Note (Signed)
Right lower extremity possibly post cellulitis ? Per pt history Dislikes compression socks/stockings, advised to wear knee high regular socks.

## 2021-08-12 NOTE — Progress Notes (Signed)
Established patient visit   Patient: Joseph Cook   DOB: 15-Aug-1941   80 y.o. Male  MRN: [id number] Visit Date: 08/12/2021  Today's healthcare provider: Mikey Kirschner, PA-C   Cc. HTN, HLD, DMII f/u  Subjective    HPI  Diabetes Mellitus Type II, follow-up  Lab Results  Component Value Date   HGBA1C 6.8 (A) 08/12/2021   HGBA1C 6.6 (A) 02/18/2021   HGBA1C 6.4 (A) 08/11/2020   Last seen for diabetes 6 months ago.  Management since then includes take metformin qd occasionally will take 1 1/2 if his BS reading is high that AM. He reports good compliance with treatment. He is not having side effects.   Home blood sugar records: fasting range: 105-140  Episodes of hypoglycemia? No   Current insulin regiment: N/A Most Recent Eye Exam: 07/2020  --------------------------------------------------------------------------------------------------- Hypertension, follow-up  BP Readings from Last 3 Encounters:  08/12/21 134/73  02/18/21 135/74  08/11/20 (!) 160/81   Wt Readings from Last 3 Encounters:  08/12/21 200 lb 1.6 oz (90.8 kg)  02/18/21 203 lb 12.8 oz (92.4 kg)  08/11/20 202 lb 11.2 oz (91.9 kg)     He was last seen for hypertension 6 months ago.  BP at that visit was 135/74. Management since that visit includes continue current medications. He reports good compliance with treatment. He is not having side effects.  He is exercising. He is adherent to low salt diet.   Outside blood pressures are n/a.  He does not smoke.    --------------------------------------------------------------------------------------------------- Lipid/Cholesterol, follow-up  Last Lipid Panel: Lab Results  Component Value Date   CHOL 127 02/18/2021   LDLCALC 66 02/18/2021   HDL 36 (L) 02/18/2021   TRIG 143 02/18/2021    He was last seen for this 6 months ago.  Management since that visit includes continue current medication.  He reports good compliance with  treatment. He is not having side effects.   Symptoms: No appetite changes No foot ulcerations  No chest pain No chest pressure/discomfort  No dyspnea No orthopnea  No fatigue No lower extremity edema  No palpitations No paroxysmal nocturnal dyspnea  No nausea No numbness or tingling of extremity  No polydipsia No polyuria  No speech difficulty No syncope   He is following a Low Sodium diet. Current exercise: team sports (golf) and walking  Last metabolic panel Lab Results  Component Value Date   GLUCOSE 152 (H) 02/18/2021   NA 141 02/18/2021   K 4.5 02/18/2021   BUN 25 02/18/2021   CREATININE 0.92 02/18/2021   EGFR 85 02/18/2021   GFRNONAA 83 02/06/2020   CALCIUM 9.8 02/18/2021   AST 12 02/18/2021   ALT 15 02/18/2021   The ASCVD Risk score (Arnett DK, et al., 2019) failed to calculate for the following reasons:   The 2019 ASCVD risk score is only valid for ages 2 to 65  ---------------------------------------------------------------------------------------------------   Medications: Outpatient Medications Prior to Visit  Medication Sig   aspirin 325 MG tablet Take 325 mg by mouth daily.   atorvastatin (LIPITOR) 10 MG tablet TAKE 1 TABLET BY MOUTH EVERYDAY AT BEDTIME   Cholecalciferol (VITAMIN D3) 50 MCG (2000 UT) TABS Take 2,000 Units by mouth daily.    lisinopril-hydrochlorothiazide (ZESTORETIC) 20-25 MG tablet TAKE ONE TABLET BY MOUTH EVERY DAY FOR BLOOD PRESSURE AND KIDNEY PROTECTION   metFORMIN (GLUCOPHAGE) 500 MG tablet TAKE 1 AND 1/2 TABLETS BY MOUTH EVERY DAY WITH BREAKFAST   metoprolol succinate (TOPROL-XL) 50 MG  24 hr tablet TAKE 1 TABLET BY MOUTH EVERY DAY   Multiple Vitamins-Minerals (PRESERVISION AREDS 2 PO) Take 1 capsule by mouth 2 (two) times daily.    omeprazole (PRILOSEC) 40 MG capsule TAKE 1 CAPSULE BY MOUTH EVERY DAY   tamsulosin (FLOMAX) 0.4 MG CAPS capsule TAKE 1 CAPSULE BY MOUTH EVERY DAY   No facility-administered medications prior to visit.     Review of Systems  Constitutional:  Negative for fatigue and fever.  Respiratory:  Negative for cough, chest tightness and shortness of breath.   Cardiovascular:  Positive for leg swelling. Negative for chest pain.  Neurological:  Negative for dizziness.  All other systems reviewed and are negative.  Last CBC Lab Results  Component Value Date   WBC 7.7 02/18/2021   HGB 14.6 02/18/2021   HCT 43.8 02/18/2021   MCV 89 02/18/2021   MCH 29.7 02/18/2021   RDW 13.4 02/18/2021   PLT 162 62/94/7654   Last metabolic panel Lab Results  Component Value Date   GLUCOSE 152 (H) 02/18/2021   NA 141 02/18/2021   K 4.5 02/18/2021   CL 102 02/18/2021   CO2 21 02/18/2021   BUN 25 02/18/2021   CREATININE 0.92 02/18/2021   EGFR 85 02/18/2021   CALCIUM 9.8 02/18/2021   PROT 6.8 02/18/2021   ALBUMIN 4.7 02/18/2021   LABGLOB 2.1 02/18/2021   AGRATIO 2.2 02/18/2021   BILITOT 0.5 02/18/2021   ALKPHOS 55 02/18/2021   AST 12 02/18/2021   ALT 15 02/18/2021   Last lipids Lab Results  Component Value Date   CHOL 127 02/18/2021   HDL 36 (L) 02/18/2021   LDLCALC 66 02/18/2021   TRIG 143 02/18/2021   CHOLHDL 3.5 02/18/2021   Last hemoglobin A1c Lab Results  Component Value Date   HGBA1C 6.8 (A) 08/12/2021   Last thyroid functions Lab Results  Component Value Date   TSH 2.360 02/06/2019       Objective    BP 134/73   Pulse 61   Temp 97.7 F (36.5 C) (Oral)   Ht '5\' 10"'  (1.778 m)   Wt 200 lb 1.6 oz (90.8 kg)   SpO2 100%   BMI 28.71 kg/m  BP Readings from Last 3 Encounters:  08/12/21 134/73  02/18/21 135/74  08/11/20 (!) 160/81   Wt Readings from Last 3 Encounters:  08/12/21 200 lb 1.6 oz (90.8 kg)  02/18/21 203 lb 12.8 oz (92.4 kg)  08/11/20 202 lb 11.2 oz (91.9 kg)      Physical Exam Constitutional:      General: He is awake.     Appearance: He is well-developed.  HENT:     Head: Normocephalic.  Eyes:     Conjunctiva/sclera: Conjunctivae normal.   Cardiovascular:     Rate and Rhythm: Normal rate and regular rhythm.     Pulses:          Dorsalis pedis pulses are 2+ on the right side and 2+ on the left side.     Heart sounds: Normal heart sounds.  Pulmonary:     Effort: Pulmonary effort is normal.     Breath sounds: Normal breath sounds.  Musculoskeletal:     Right lower leg: 1+ Edema present.  Feet:     Right foot:     Protective Sensation: 2 sites tested.  2 sites sensed.     Skin integrity: Skin integrity normal.     Toenail Condition: Right toenails are normal.     Left foot:  Protective Sensation: 2 sites tested.  2 sites sensed.     Skin integrity: Skin integrity normal.     Toenail Condition: Left toenails are normal.  Skin:    General: Skin is warm.  Neurological:     Mental Status: He is alert and oriented to person, place, and time.  Psychiatric:        Attention and Perception: Attention normal.        Mood and Affect: Mood normal.        Speech: Speech normal.        Behavior: Behavior is cooperative.     Results for orders placed or performed in visit on 08/12/21  POCT HgB A1C  Result Value Ref Range   Hemoglobin A1C 6.8 (A) 4.0 - 5.6 %   HbA1c POC (<> result, manual entry)     HbA1c, POC (prediabetic range)     HbA1c, POC (controlled diabetic range)      Assessment & Plan     Problem List Items Addressed This Visit       Cardiovascular and Mediastinum   Primary hypertension    Chronic and well controlled on medication, lisinopril-hctz and metoprolol.  Continue medications as prescribed, f/u 81mo     Relevant Orders   Comprehensive Metabolic Panel (CMET)   Urine Microalbumin w/creat. ratio     Endocrine   T2DM (type 2 diabetes mellitus) ([place]    Hemoglobin A1C Latest Ref Rng & Units 08/12/2021 02/18/2021  HGBA1C 4.0 - 5.6 % 6.8(A) 6.6(A)  Some recent data might be hidden  Discussed increased in A1c despite similar diet w/ pt- we agreed to increase Metformin to 500 mg BID, with  breakfast and dinner.  Pt prefers to f/u in 6 months.   Will check urine microal/creat.       Relevant Orders   POCT HgB A1C (Completed)   Urine Microalbumin w/creat. ratio     Other   Hyperlipidemia    Chronic and well controlled on low dose Atorvastatin 10 mg. Will recheck lipid panel and CMP      Relevant Orders   Lipid Profile   Comprehensive Metabolic Panel (CMET)   Lymphedema    Right lower extremity possibly post cellulitis ? Per pt history Dislikes compression socks/stockings, advised to wear knee high regular socks.       Other Visit Diagnoses     Need for immunization against influenza       Relevant Orders   Flu Vaccine QUAD High Dose(Fluad) (Completed)        Return in about 6 months (around 02/09/2022) for hypertension, DMII, hyperlipidemia.      I, LMikey Kirschner PA-C have reviewed all documentation for this visit. The documentation on  08/12/2021 for the exam, diagnosis, procedures, and orders are all accurate and complete.    LMikey Kirschner PA-C  [phone](phone) [phone](fax)  [place]

## 2021-08-12 NOTE — Assessment & Plan Note (Signed)
Chronic and well controlled on medication, lisinopril-hctz and metoprolol.  Continue medications as prescribed, f/u 32mo

## 2021-08-13 LAB — COMPREHENSIVE METABOLIC PANEL
ALT: 21 IU/L (ref 0–44)
AST: 15 IU/L (ref 0–40)
Albumin/Globulin Ratio: 2.5 — ABNORMAL HIGH (ref 1.2–2.2)
Albumin: 4.8 g/dL — ABNORMAL HIGH (ref 3.7–4.7)
Alkaline Phosphatase: 47 IU/L (ref 44–121)
BUN/Creatinine Ratio: 23 (ref 10–24)
BUN: 20 mg/dL (ref 8–27)
Bilirubin Total: 0.7 mg/dL (ref 0.0–1.2)
CO2: 23 mmol/L (ref 20–29)
Calcium: 9.9 mg/dL (ref 8.6–10.2)
Chloride: 102 mmol/L (ref 96–106)
Creatinine, Ser: 0.86 mg/dL (ref 0.76–1.27)
Globulin, Total: 1.9 g/dL (ref 1.5–4.5)
Glucose: 136 mg/dL — ABNORMAL HIGH (ref 70–99)
Potassium: 4.4 mmol/L (ref 3.5–5.2)
Sodium: 143 mmol/L (ref 134–144)
Total Protein: 6.7 g/dL (ref 6.0–8.5)
eGFR: 88 mL/min/{1.73_m2} (ref >59)

## 2021-08-13 LAB — MICROALBUMIN / CREATININE URINE RATIO
Creatinine, Urine: 105 mg/dL
Microalb/Creat Ratio: 8 mg/g creat (ref 0–29)
Microalbumin, Urine: 8.1 ug/mL

## 2021-08-13 LAB — LIPID PANEL
Chol/HDL Ratio: 3.4 ratio (ref 0.0–5.0)
Cholesterol, Total: 124 mg/dL (ref 100–199)
HDL: 37 mg/dL — ABNORMAL LOW (ref >39)
LDL Chol Calc (NIH): 60 mg/dL (ref 0–99)
Triglycerides: 158 mg/dL — ABNORMAL HIGH (ref 0–149)
VLDL Cholesterol Cal: 27 mg/dL (ref 5–40)

## 2021-08-15 ENCOUNTER — Other Ambulatory Visit: Payer: Self-pay | Admitting: Family Medicine

## 2021-08-15 DIAGNOSIS — I10 Essential (primary) hypertension: Secondary | ICD-10-CM

## 2021-08-15 DIAGNOSIS — E1136 Type 2 diabetes mellitus with diabetic cataract: Secondary | ICD-10-CM

## 2021-09-26 ENCOUNTER — Other Ambulatory Visit: Payer: Self-pay | Admitting: Family Medicine

## 2021-09-26 DIAGNOSIS — I10 Essential (primary) hypertension: Secondary | ICD-10-CM

## 2021-09-27 NOTE — Telephone Encounter (Signed)
Requested Prescriptions  Pending Prescriptions Disp Refills   metoprolol succinate (TOPROL-XL) 50 MG 24 hr tablet [Pharmacy Med Name: METOPROLOL SUCC ER 50 MG TAB] 90 tablet 1    Sig: TAKE 1 TABLET BY MOUTH EVERY DAY     Cardiovascular:  Beta Blockers Passed - 09/26/2021  1:38 AM      Passed - Last BP in normal range    BP Readings from Last 1 Encounters:  08/12/21 134/73         Passed - Last Heart Rate in normal range    Pulse Readings from Last 1 Encounters:  08/12/21 [place] encounter within last 6 months    Recent Outpatient Visits          1 month ago Controlled type 2 diabetes mellitus without complication, without long-term current use of insulin ([place])   [facility] Thedore Mins, [place], PA-C   7 months ago Type 2 diabetes mellitus with diabetic cataract, without long-term current use of insulin ([place])   [facility] Birdie Sons, MD   1 year ago Type 2 diabetes mellitus with diabetic cataract, without long-term current use of insulin [facility])   [place], [place], [place]   1 year ago Type 2 diabetes mellitus with diabetic cataract, without long-term current use of insulin [facility])   [place], [place]   1 year ago Gastroesophageal reflux disease without esophagitis   [facility], Clearnce [place], [place]      Future Appointments            In 4 months Thedore Mins, [place], PA-C [employer], [place]   In 7 months Ralene Bathe, MD [place]

## 2021-10-05 ENCOUNTER — Ambulatory Visit: Payer: Self-pay

## 2021-10-05 NOTE — Telephone Encounter (Signed)
Apt 10/06/2021 at 3:40.  Pt advised.   Thanks,   -Mickel Baas

## 2021-10-05 NOTE — Telephone Encounter (Signed)
Summary: Cold/Flu symptoms   Pt called to report that he has head congestion + cough + a slight headache. His symptoms have improved and then relapsed for about 10-12 days.   Best contact: [phone]       Chief Complaint: Cough, runny nose x 2 weeks. States he has home COVID test, "but I can't do it." Request in office visit as soon as possible. No availability until Friday 10/07/21. Going out of town Saturday for a memorial and "want to be sure I'm safe to be around other people.' Symptoms: Clear mucus with cough. Frequency: Started 2 weeks ago Pertinent Negatives: Patient denies fever Disposition: [] ED /[] Urgent Care (no appt availability in office) / [] Appointment(In office/virtual)/ []  [place] [facility]/ [] Home Care/ [] Refused Recommended Disposition /[] [place] [facility]/ []  Follow-up with PCP Additional Notes:    Answer Assessment - Initial Assessment Questions 1. ONSET: "When did the cough begin?"      2 weeks ago 2. SEVERITY: "How bad is the cough today?"      Moderate 3. SPUTUM: "Describe the color of your sputum" (none, dry cough; clear, white, yellow, green)     Clear  4. HEMOPTYSIS: "Are you coughing up any blood?" If so ask: "How much?" (flecks, streaks, tablespoons, etc.)     No 5. DIFFICULTY BREATHING: "Are you having difficulty breathing?" If Yes, ask: "How bad is it?" (e.g., mild, moderate, severe)    - MILD: No SOB at rest, mild SOB with walking, speaks normally in sentences, can lie down, no retractions, pulse < 100.    - MODERATE: SOB at rest, SOB with minimal exertion and prefers to sit, cannot lie down flat, speaks in phrases, mild retractions, audible wheezing, pulse 100-120.    - SEVERE: Very SOB at rest, speaks in single words, struggling to breathe, sitting hunched forward, retractions, pulse > 120      Mild 6. FEVER: "Do you have a fever?" If Yes, ask: "What is your temperature, how was it measured, and when did it start?"     No 7. CARDIAC  HISTORY: "Do you have any history of heart disease?" (e.g., heart attack, congestive heart failure)      PVC's   8. LUNG HISTORY: "Do you have any history of lung disease?"  (e.g., pulmonary embolus, asthma, emphysema)     No 9. PE RISK FACTORS: "Do you have a history of blood clots?" (or: recent major surgery, recent prolonged travel, bedridden)     No 10. OTHER SYMPTOMS: "Do you have any other symptoms?" (e.g., runny nose, wheezing, chest pain)       Runny nose 11. PREGNANCY: "Is there any chance you are pregnant?" "When was your last menstrual period?"       N/a 12. TRAVEL: "Have you traveled out of the country in the last month?" (e.g., travel history, exposures)       No  Protocols used: Cough - Acute Productive-A-AH

## 2021-10-06 ENCOUNTER — Other Ambulatory Visit: Payer: Self-pay

## 2021-10-06 ENCOUNTER — Ambulatory Visit (INDEPENDENT_AMBULATORY_CARE_PROVIDER_SITE_OTHER): Payer: Medicare Other | Admitting: Physician Assistant

## 2021-10-06 ENCOUNTER — Encounter: Payer: Self-pay | Admitting: Physician Assistant

## 2021-10-06 VITALS — BP 152/78 | HR 65 | Temp 97.5°F | Ht 72.0 in | Wt 205.2 lb

## 2021-10-06 DIAGNOSIS — J0110 Acute frontal sinusitis, unspecified: Secondary | ICD-10-CM | POA: Diagnosis not present

## 2021-10-06 MED ORDER — AMOXICILLIN-POT CLAVULANATE 875-125 MG PO TABS: 1.0000 | ORAL_TABLET | Freq: Two times a day (BID) | ORAL | 0 refills | Status: DC

## 2021-10-06 NOTE — Progress Notes (Signed)
I,Sha'taria Tyson,acting as a Education administrator for [employer], PA-C.,have documented all relevant documentation on the behalf of Mikey Kirschner, PA-C,as directed by  Mikey Kirschner, PA-C while in the presence of Mikey Kirschner, PA-C.  Acute Office Visit  Subjective:    Patient ID: Joseph Cook, male    DOB: 1940/12/20, 81 y.o.   MRN: [id number]  Cc. URI sxs   HPI Joseph Cook is an 81 y/o male with a history of HTN, DMII, who presents today with two weeks of sinus congestion, cough, chest congestion, headaches. Denies SOB, wheezing, fevers. Not taking anything at home currently, at first took a sinus medication.  Going out of town this weekend and wants to know if he is contagious.    Past Medical History:  Diagnosis Date   Actinic keratosis    Basal cell carcinoma    Cataract    Diabetes mellitus    Dysplastic nevus 08/13/2009   right medial lower leg above medial ankle, malleolus -severe   Glaucoma    Hyperlipidemia    Hypertension    PVC (premature ventricular contraction)     Past Surgical History:  Procedure Laterality Date   HERNIA REPAIR     ROTATOR CUFF REPAIR      Family History  Problem Relation Age of Onset   Stroke Mother    Heart attack Father    Alzheimer's disease Maternal Aunt    Heart attack Maternal Uncle    Cancer Maternal Grandmother    Cancer Paternal Grandmother     Social History   Socioeconomic History   Marital status: Married    Spouse name: Not on file   Number of children: 5   Years of education: Not on file   Highest education level: Some college, no degree  Occupational History   Occupation: retired  Tobacco Use   Smoking status: Never   Smokeless tobacco: Former  Substance and Sexual Activity   Alcohol use: Not Currently   Drug use: No   Sexual activity: Not on file  Other Topics Concern   Not on file  Social History Narrative   Not on file   Social Determinants of Health   Financial Resource Strain: Not on file  Food  Insecurity: Not on file  Transportation Needs: Not on file  Physical Activity: Not on file  Stress: Not on file  Social Connections: Not on file  Intimate Partner Violence: Not on file    Outpatient Medications Prior to Visit  Medication Sig Dispense Refill   aspirin 325 MG tablet Take 325 mg by mouth daily.     atorvastatin (LIPITOR) 10 MG tablet TAKE 1 TABLET BY MOUTH EVERYDAY AT BEDTIME 90 tablet 1   Cholecalciferol (VITAMIN D3) 50 MCG (2000 UT) TABS Take 2,000 Units by mouth daily.      lisinopril-hydrochlorothiazide (ZESTORETIC) 20-25 MG tablet TAKE ONE TABLET BY MOUTH EVERY DAY FOR BLOOD PRESSURE AND KIDNEY PROTECTION 90 tablet 1   metFORMIN (GLUCOPHAGE) 500 MG tablet Take 1 tablet (500 mg total) by mouth 2 (two) times daily with a meal. 180 tablet 2   metoprolol succinate (TOPROL-XL) 50 MG 24 hr tablet TAKE 1 TABLET BY MOUTH EVERY DAY 90 tablet 1   Multiple Vitamins-Minerals (PRESERVISION AREDS 2 PO) Take 1 capsule by mouth 2 (two) times daily.      omeprazole (PRILOSEC) 40 MG capsule TAKE 1 CAPSULE BY MOUTH EVERY DAY 90 capsule 0   tamsulosin (FLOMAX) 0.4 MG CAPS capsule TAKE 1 CAPSULE BY MOUTH EVERY DAY 90  capsule 1   No facility-administered medications prior to visit.    No Known Allergies  Review of Systems  Constitutional:  Positive for fatigue. Negative for fever.  HENT:  Positive for congestion, rhinorrhea and sinus pain.   Respiratory:  Positive for cough. Negative for shortness of breath.   Cardiovascular:  Negative for chest pain.      Objective:    Physical Exam Constitutional:      General: He is awake.     Appearance: He is well-developed.  HENT:     Head: Normocephalic.     Right Ear: There is impacted cerumen.     Left Ear: There is impacted cerumen.     Nose: Congestion and rhinorrhea present.     Mouth/Throat:     Mouth: Mucous membranes are moist.     Pharynx: Posterior oropharyngeal erythema present.  Eyes:     Conjunctiva/sclera: Conjunctivae  normal.  Cardiovascular:     Rate and Rhythm: Normal rate and regular rhythm.     Heart sounds: Murmur heard.  Systolic murmur is present with a grade of 2/6.  Pulmonary:     Effort: Pulmonary effort is normal.     Breath sounds: No wheezing, rhonchi or rales.     Comments: Small amount of decreased breath sounds on right compared to left.  Skin:    General: Skin is warm.  Neurological:     Mental Status: He is alert and oriented to person, place, and time.  Psychiatric:        Attention and Perception: Attention normal.        Mood and Affect: Mood normal.        Speech: Speech normal.        Behavior: Behavior is cooperative.    There were no vitals taken for this visit. Wt Readings from Last 3 Encounters:  08/12/21 200 lb 1.6 oz (90.8 kg)  02/18/21 203 lb 12.8 oz (92.4 kg)  08/11/20 202 lb 11.2 oz (91.9 kg)    Health Maintenance Due  Topic Date Due   Pneumonia Vaccine 25+ Years old (1 - PCV) Never done   Zoster Vaccines- Shingrix (1 of 2) Never done   COVID-19 Vaccine (3 - Pfizer risk series) 12/03/2019   OPHTHALMOLOGY EXAM  08/12/2020    There are no preventive care reminders to display for this patient.   Lab Results  Component Value Date   TSH 2.360 02/06/2019   Lab Results  Component Value Date   WBC 7.7 02/18/2021   HGB 14.6 02/18/2021   HCT 43.8 02/18/2021   MCV 89 02/18/2021   PLT 162 02/18/2021   Lab Results  Component Value Date   NA 143 08/12/2021   K 4.4 08/12/2021   CO2 23 08/12/2021   GLUCOSE 136 (H) 08/12/2021   BUN 20 08/12/2021   CREATININE 0.86 08/12/2021   BILITOT 0.7 08/12/2021   ALKPHOS 47 08/12/2021   AST 15 08/12/2021   ALT 21 08/12/2021   PROT 6.7 08/12/2021   ALBUMIN 4.8 (H) 08/12/2021   CALCIUM 9.9 08/12/2021   EGFR 88 08/12/2021   Lab Results  Component Value Date   CHOL 124 08/12/2021   Lab Results  Component Value Date   HDL 37 (L) 08/12/2021   Lab Results  Component Value Date   LDLCALC 60 08/12/2021   Lab  Results  Component Value Date   TRIG 158 (H) 08/12/2021   Lab Results  Component Value Date   CHOLHDL 3.4 08/12/2021  Lab Results  Component Value Date   HGBA1C 6.8 (A) 08/12/2021       Assessment & Plan:   Acute sinusitis Rx augmentin x 10 days, will cover a potential developing pneumonia as well. As pt is going out of town, will monitor, if symptoms worsen, will call office and we can proceed w/ cxr Advised take with food, increase fluids Ok to take tylenol for pain If fever, chills, body aches, sob, please call office --covid/flu/rsv swab to r/o   I, Mikey Kirschner, PA-C have reviewed all documentation for this visit. The documentation on  10/06/2021 for the exam, diagnosis, procedures, and orders are all accurate and complete.

## 2021-10-06 NOTE — Patient Instructions (Signed)
Preventive Care 65 Years and Older, Male °Preventive care refers to lifestyle choices and visits with your health care provider that can promote health and wellness. Preventive care visits are also called wellness exams. °What can I expect for my preventive care visit? °Counseling °During your preventive care visit, your health care provider may ask about your: °Medical history, including: °Past medical problems. °Family medical history. °History of falls. °Current health, including: °Emotional well-being. °Home life and relationship well-being. °Sexual activity. °Memory and ability to understand (cognition). °Lifestyle, including: °Alcohol, nicotine or tobacco, and drug use. °Access to firearms. °Diet, exercise, and sleep habits. °Work and work environment. °Sunscreen use. °Safety issues such as seatbelt and bike helmet use. °Physical exam °Your health care provider will check your: °Height and weight. These may be used to calculate your BMI (body mass index). BMI is a measurement that tells if you are at a healthy weight. °Waist circumference. This measures the distance around your waistline. This measurement also tells if you are at a healthy weight and may help predict your risk of certain diseases, such as type 2 diabetes and high blood pressure. °Heart rate and blood pressure. °Body temperature. °Skin for abnormal spots. °What immunizations do I need? °Vaccines are usually given at various ages, according to a schedule. Your health care provider will recommend vaccines for you based on your age, medical history, and lifestyle or other factors, such as travel or where you work. °What tests do I need? °Screening °Your health care provider may recommend screening tests for certain conditions. This may include: °Lipid and cholesterol levels. °Diabetes screening. This is done by checking your blood sugar (glucose) after you have not eaten for a while (fasting). °Hepatitis C test. °Hepatitis B test. °HIV (human  immunodeficiency virus) test. °STI (sexually transmitted infection) testing, if you are at risk. °Lung cancer screening. °Colorectal cancer screening. °Prostate cancer screening. °Abdominal aortic aneurysm (AAA) screening. You may need this if you are a current or former smoker. °Talk with your health care provider about your test results, treatment options, and if necessary, the need for more tests. °Follow these instructions at home: °Eating and drinking ° °Eat a diet that includes fresh fruits and vegetables, whole grains, lean protein, and low-fat dairy products. Limit your intake of foods with high amounts of sugar, saturated fats, and salt. °Take vitamin and mineral supplements as recommended by your health care provider. °Do not drink alcohol if your health care provider tells you not to drink. °If you drink alcohol: °Limit how much you have to 0-2 drinks a day. °Know how much alcohol is in your drink. In the [place] one drink equals one 12 oz bottle of beer (355 mL), one 5 oz glass of wine (148 mL), or one 1½ oz glass of hard liquor (44 mL). °Lifestyle °Brush your teeth every morning and night with fluoride toothpaste. Floss one time each day. °Exercise for at least 30 minutes 5 or more days each week. °Do not use any products that contain nicotine or tobacco. These products include cigarettes, chewing tobacco, and vaping devices, such as e-cigarettes. If you need help quitting, ask your health care provider. °Do not use drugs. °If you are sexually active, practice safe sex. Use a condom or other form of protection to prevent STIs. °Take aspirin only as told by your health care provider. Make sure that you understand how much to take and what form to take. Work with your health care provider to find out whether it is safe and   beneficial for you to take aspirin daily. °Ask your health care provider if you need to take a cholesterol-lowering medicine (statin). °Find healthy ways to manage stress, such  as: °Meditation, yoga, or listening to music. °Journaling. °Talking to a trusted person. °Spending time with friends and family. °Safety °Always wear your seat belt while driving or riding in a vehicle. °Do not drive: °If you have been drinking alcohol. Do not ride with someone who has been drinking. °When you are tired or distracted. °While texting. °If you have been using any mind-altering substances or drugs. °Wear a helmet and other protective equipment during sports activities. °If you have firearms in your house, make sure you follow all gun safety procedures. °Minimize exposure to UV radiation to reduce your risk of skin cancer. °What's next? °Visit your health care provider once a year for an annual wellness visit. °Ask your health care provider how often you should have your eyes and teeth checked. °Stay up to date on all vaccines. °This information is not intended to replace advice given to you by your health care provider. Make sure you discuss any questions you have with your health care provider. °Document Revised: 03/09/2021 Document Reviewed: 03/09/2021 °Elsevier Patient Education © 2022 [employer]. ° °

## 2021-10-07 LAB — COVID-19, FLU A+B AND RSV
Influenza A, NAA: NOT DETECTED
Influenza B, NAA: NOT DETECTED
RSV, NAA: NOT DETECTED
SARS-CoV-2, NAA: NOT DETECTED

## 2021-10-13 ENCOUNTER — Other Ambulatory Visit: Payer: Self-pay | Admitting: Physician Assistant

## 2021-10-13 DIAGNOSIS — R0989 Other specified symptoms and signs involving the circulatory and respiratory systems: Secondary | ICD-10-CM

## 2021-10-13 NOTE — Telephone Encounter (Signed)
Requested Prescriptions  Pending Prescriptions Disp Refills   omeprazole (PRILOSEC) 40 MG capsule [Pharmacy Med Name: OMEPRAZOLE DR 40 MG CAPSULE] 90 capsule 0    Sig: TAKE 1 CAPSULE BY MOUTH EVERY DAY     Gastroenterology: Proton Pump Inhibitors Passed - 10/13/2021  3:22 AM      Passed - Valid encounter within last 12 months    Recent Outpatient Visits          1 week ago Acute non-recurrent frontal sinusitis   [facility] Mikey Kirschner, PA-C   2 months ago Controlled type 2 diabetes mellitus without complication, without long-term current use of insulin ([place])   [facility] Thedore Mins, [place], PA-C   7 months ago Type 2 diabetes mellitus with diabetic cataract, without long-term current use of insulin ([place])   [facility] [place] Birdie Sons, MD   1 year ago Type 2 diabetes mellitus with diabetic cataract, without long-term current use of insulin [facility])   [place], [place], [place]   1 year ago Type 2 diabetes mellitus with diabetic cataract, without long-term current use of insulin [facility])   [facility] [place], [place]ce [place], [place]      Future Appointments            In 3 months Thedore Mins, Ria Comment, PA-C [employer], [place]   In 7 months Ralene Bathe, MD [place]

## 2021-11-07 DIAGNOSIS — M545 Low back pain, unspecified: Secondary | ICD-10-CM | POA: Insufficient documentation

## 2021-11-14 ENCOUNTER — Inpatient Hospital Stay
Admission: EM | Admit: 2021-11-14 | Discharge: 2021-11-16 | DRG: 309 | Disposition: A | Discharge status: Home / Self Care | Payer: Medicare Other | Attending: Family Medicine | Admitting: Family Medicine

## 2021-11-14 ENCOUNTER — Other Ambulatory Visit: Payer: Self-pay

## 2021-11-14 ENCOUNTER — Ambulatory Visit: Payer: Self-pay | Admitting: *Deleted

## 2021-11-14 ENCOUNTER — Emergency Department: Payer: Medicare Other

## 2021-11-14 DIAGNOSIS — I493 Ventricular premature depolarization: Secondary | ICD-10-CM | POA: Diagnosis not present

## 2021-11-14 DIAGNOSIS — R011 Cardiac murmur, unspecified: Secondary | ICD-10-CM | POA: Diagnosis present

## 2021-11-14 DIAGNOSIS — I471 Supraventricular tachycardia, unspecified: Secondary | ICD-10-CM

## 2021-11-14 DIAGNOSIS — T502X5A Adverse effect of carbonic-anhydrase inhibitors, benzothiadiazides and other diuretics, initial encounter: Secondary | ICD-10-CM | POA: Diagnosis present

## 2021-11-14 DIAGNOSIS — Z820 Family history of epilepsy and other diseases of the nervous system: Secondary | ICD-10-CM

## 2021-11-14 DIAGNOSIS — I890 Lymphedema, not elsewhere classified: Secondary | ICD-10-CM | POA: Diagnosis present

## 2021-11-14 DIAGNOSIS — R55 Syncope and collapse: Secondary | ICD-10-CM

## 2021-11-14 DIAGNOSIS — E1169 Type 2 diabetes mellitus with other specified complication: Secondary | ICD-10-CM

## 2021-11-14 DIAGNOSIS — Z85828 Personal history of other malignant neoplasm of skin: Secondary | ICD-10-CM

## 2021-11-14 DIAGNOSIS — R42 Dizziness and giddiness: Secondary | ICD-10-CM | POA: Diagnosis not present

## 2021-11-14 DIAGNOSIS — E876 Hypokalemia: Secondary | ICD-10-CM | POA: Diagnosis present

## 2021-11-14 DIAGNOSIS — Z20822 Contact with and (suspected) exposure to covid-19: Secondary | ICD-10-CM | POA: Diagnosis present

## 2021-11-14 DIAGNOSIS — I152 Hypertension secondary to endocrine disorders: Secondary | ICD-10-CM

## 2021-11-14 DIAGNOSIS — I10 Essential (primary) hypertension: Secondary | ICD-10-CM | POA: Diagnosis present

## 2021-11-14 DIAGNOSIS — Z8249 Family history of ischemic heart disease and other diseases of the circulatory system: Secondary | ICD-10-CM

## 2021-11-14 DIAGNOSIS — Z87891 Personal history of nicotine dependence: Secondary | ICD-10-CM

## 2021-11-14 DIAGNOSIS — H409 Unspecified glaucoma: Secondary | ICD-10-CM | POA: Diagnosis present

## 2021-11-14 DIAGNOSIS — E785 Hyperlipidemia, unspecified: Secondary | ICD-10-CM | POA: Diagnosis present

## 2021-11-14 DIAGNOSIS — Z823 Family history of stroke: Secondary | ICD-10-CM

## 2021-11-14 DIAGNOSIS — E119 Type 2 diabetes mellitus without complications: Secondary | ICD-10-CM | POA: Diagnosis present

## 2021-11-14 DIAGNOSIS — R002 Palpitations: Secondary | ICD-10-CM

## 2021-11-14 LAB — CBC
HCT: 46.5 % (ref 39.0–52.0)
Hemoglobin: 16 g/dL (ref 13.0–17.0)
MCH: 29.3 pg (ref 26.0–34.0)
MCHC: 34.4 g/dL (ref 30.0–36.0)
MCV: 85.2 fL (ref 80.0–100.0)
Platelets: 160 10*3/uL (ref 150–400)
RBC: 5.46 MIL/uL (ref 4.22–5.81)
RDW: 12.9 % (ref 11.5–15.5)
WBC: 7 10*3/uL (ref 4.0–10.5)
nRBC: 0 % (ref 0.0–0.2)

## 2021-11-14 LAB — BASIC METABOLIC PANEL
Anion gap: 12 (ref 5–15)
BUN: 24 mg/dL — ABNORMAL HIGH (ref 8–23)
CO2: 19 mmol/L — ABNORMAL LOW (ref 22–32)
Calcium: 8.6 mg/dL — ABNORMAL LOW (ref 8.9–10.3)
Chloride: 104 mmol/L (ref 98–111)
Creatinine, Ser: 0.95 mg/dL (ref 0.61–1.24)
GFR, Estimated: >60 mL/min (ref >60)
Glucose, Bld: 180 mg/dL — ABNORMAL HIGH (ref 70–99)
Potassium: 3.3 mmol/L — ABNORMAL LOW (ref 3.5–5.1)
Sodium: 135 mmol/L (ref 135–145)

## 2021-11-14 LAB — CREATININE, SERUM
Creatinine, Ser: 0.71 mg/dL (ref 0.61–1.24)
GFR, Estimated: >60 mL/min (ref >60)

## 2021-11-14 LAB — CBG MONITORING, ED: Glucose-Capillary: 168 mg/dL — ABNORMAL HIGH (ref 70–99)

## 2021-11-14 LAB — TROPONIN I (HIGH SENSITIVITY)
Troponin I (High Sensitivity): 9 ng/L (ref <18)
Troponin I (High Sensitivity): 14 ng/L (ref <18)

## 2021-11-14 LAB — RESP PANEL BY RT-PCR (FLU A&B, COVID) ARPGX2
Influenza A by PCR: NEGATIVE
Influenza B by PCR: NEGATIVE
SARS Coronavirus 2 by RT PCR: NEGATIVE

## 2021-11-14 LAB — TSH: TSH: 3.723 u[IU]/mL (ref 0.350–4.500)

## 2021-11-14 MED ORDER — HYDROCHLOROTHIAZIDE 25 MG PO TABS: 25.0000 mg | ORAL_TABLET | Freq: Every day | ORAL | Status: DC

## 2021-11-14 MED ORDER — INSULIN ASPART 100 UNIT/ML IJ SOLN: 0.0000 [IU] | Freq: Every day | INTRAMUSCULAR | Status: DC

## 2021-11-14 MED ORDER — INSULIN ASPART 100 UNIT/ML IJ SOLN
0.0000 [IU] | Freq: Three times a day (TID) | INTRAMUSCULAR | Status: DC
  Administered 2021-11-15 (×2): 3 [IU] via SUBCUTANEOUS
  Administered 2021-11-16: 2 [IU] via SUBCUTANEOUS
  Filled 2021-11-14 (×3): qty 1

## 2021-11-14 MED ORDER — SODIUM CHLORIDE 0.9% FLUSH
3.0000 mL | Freq: Two times a day (BID) | INTRAVENOUS | Status: DC
  Administered 2021-11-15 – 2021-11-16 (×2): 3 mL via INTRAVENOUS

## 2021-11-14 MED ORDER — LISINOPRIL 10 MG PO TABS: 20.0000 mg | ORAL_TABLET | Freq: Every day | ORAL | Status: DC

## 2021-11-14 MED ORDER — METOPROLOL SUCCINATE ER 50 MG PO TB24
50.0000 mg | ORAL_TABLET | Freq: Every day | ORAL | Status: DC
  Administered 2021-11-15: 50 mg via ORAL
  Filled 2021-11-14: qty 1

## 2021-11-14 MED ORDER — LISINOPRIL-HYDROCHLOROTHIAZIDE 20-25 MG PO TABS: 1.0000 | ORAL_TABLET | Freq: Every day | ORAL | Status: DC

## 2021-11-14 MED ORDER — SODIUM CHLORIDE 0.9 % IV BOLUS
500.0000 mL | Freq: Once | INTRAVENOUS | Status: AC
Start: 2021-11-14 — End: 2021-11-15
  Administered 2021-11-14: 500 mL via INTRAVENOUS

## 2021-11-14 MED ORDER — ENOXAPARIN SODIUM 40 MG/0.4ML IJ SOSY
40.0000 mg | PREFILLED_SYRINGE | INTRAMUSCULAR | Status: DC
  Administered 2021-11-15: 40 mg via SUBCUTANEOUS
  Filled 2021-11-14 (×2): qty 0.4

## 2021-11-14 MED ORDER — ONDANSETRON HCL 4 MG/2ML IJ SOLN: 4.0000 mg | Freq: Four times a day (QID) | INTRAMUSCULAR | Status: DC | PRN

## 2021-11-14 MED ORDER — ACETAMINOPHEN 650 MG RE SUPP: 650.0000 mg | Freq: Four times a day (QID) | RECTAL | Status: DC | PRN

## 2021-11-14 MED ORDER — POTASSIUM CHLORIDE CRYS ER 20 MEQ PO TBCR
40.0000 meq | EXTENDED_RELEASE_TABLET | Freq: Once | ORAL | Status: AC
  Administered 2021-11-14: 40 meq via ORAL
  Filled 2021-11-14: qty 2

## 2021-11-14 MED ORDER — TAMSULOSIN HCL 0.4 MG PO CAPS
0.4000 mg | ORAL_CAPSULE | Freq: Every day | ORAL | Status: DC
Start: 2021-11-15 — End: 2021-11-15

## 2021-11-14 MED ORDER — ACETAMINOPHEN 325 MG PO TABS: 650.0000 mg | ORAL_TABLET | Freq: Four times a day (QID) | ORAL | Status: DC | PRN

## 2021-11-14 MED ORDER — ONDANSETRON HCL 4 MG PO TABS: 4.0000 mg | ORAL_TABLET | Freq: Four times a day (QID) | ORAL | Status: DC | PRN

## 2021-11-14 MED ORDER — ATORVASTATIN CALCIUM 20 MG PO TABS
20.0000 mg | ORAL_TABLET | Freq: Every day | ORAL | Status: DC
  Administered 2021-11-15 – 2021-11-16 (×2): 20 mg via ORAL
  Filled 2021-11-14 (×2): qty 1

## 2021-11-14 MED ORDER — METFORMIN HCL 500 MG PO TABS
500.0000 mg | ORAL_TABLET | Freq: Two times a day (BID) | ORAL | Status: DC
  Administered 2021-11-15 – 2021-11-16 (×3): 500 mg via ORAL
  Filled 2021-11-14 (×3): qty 1

## 2021-11-14 NOTE — Assessment & Plan Note (Addendum)
Potassium 3.3 on presentation up to 3.6..  Hold hydrochlorothiazide.

## 2021-11-14 NOTE — Assessment & Plan Note (Deleted)
Concern for arrhythmia/dysrhythmia in view of preceding palpitations Past history of PVCs.  Patient was started on metoprolol at the time Continuous cardiac monitoring, echocardiogram Fall precautions Cardiology consult in the a.m. if any overnight events versus outpatient referral

## 2021-11-14 NOTE — H&P (Signed)
History and Physical    Patient: Joseph Cook VHQ:[id number] DOB: 10-Oct-1940 DOA: 11/14/2021 DOS: the patient was seen and examined on 11/14/2021 PCP: Mikey Kirschner, PA-C  Patient coming from: Home  Chief Complaint:  Chief Complaint  Patient presents with   Palpitations    HPI: Joseph Cook is a 81 y.o. male with medical history significant of HTN, DM, prior history of systolic murmur and PVCs on PCP documentation from 2020, who presents to the ED with palpitations and a sensation of skipped beats, associated with flushing, lightheadedness and a near syncopal episode.  Patient states for the past several months he has had intermittent episodes lasting just about a minute which he attributed to his PVCs which she states he has had since the 1970s.  On the day of arrival the episode started in the late afternoon and was ongoing and was of a waxing and waning intensity and thus he presented to the ED.  He denied chest pain, shortness of breath.  Denies one-sided numbness weakness or tingling, or headache or visual disturbance.  ED course: Pulse 79-91 with otherwise normal vitals Blood work troponin 9-14, potassium 3.3, bicarb 19, glucose 180, otherwise unremarkable  EKG, personally viewed and interpreted: Sinus rhythm at 88 with PACs and nonspecific ST-T wave changes  Chest x-ray no acute process  Patient treated with an IV fluid bolus of NS 500 mL.  Hospitalist consulted for observation admission for telemetry monitoring and syncope work-up.   Data Reviewed: Relevant notes from primary care and specialist visits, past discharge summaries as available in EHR, including Care Everywhere. Prior diagnostic testing as pertinent to current admission diagnoses Updated medications and problem lists for reconciliation ED course, including vitals, labs, imaging, treatment and response to treatment Triage notes, nursing and pharmacy notes and ED provider's notes Notable results as noted in  HPI   Review of Systems: As mentioned in the history of present illness. All other systems reviewed and are negative. Past Medical History:  Diagnosis Date   Actinic keratosis    Basal cell carcinoma    Cataract    Diabetes mellitus    Dysplastic nevus 08/13/2009   right medial lower leg above medial ankle, malleolus -severe   Glaucoma    Hyperlipidemia    Hypertension    PVC (premature ventricular contraction)    Past Surgical History:  Procedure Laterality Date   HERNIA REPAIR     ROTATOR CUFF REPAIR     Social History:  reports that he has never smoked. He has quit using smokeless tobacco. He reports that he does not currently use alcohol. He reports that he does not use drugs.  No Known Allergies  Family History  Problem Relation Age of Onset   Stroke Mother    Heart attack Father    Alzheimer's disease Maternal Aunt    Heart attack Maternal Uncle    Cancer Maternal Grandmother    Cancer Paternal Grandmother     Prior to Admission medications   Medication Sig Start Date End Date Taking? Authorizing Provider  amoxicillin-clavulanate (AUGMENTIN) 875-125 MG tablet Take 1 tablet by mouth 2 (two) times daily. 10/06/21   Mikey Kirschner, PA-C  aspirin 325 MG tablet Take 325 mg by mouth daily.    [provider]  atorvastatin (LIPITOR) 10 MG tablet TAKE 1 TABLET BY MOUTH EVERYDAY AT BEDTIME 08/04/21   Drubel, Ria Comment, PA-C  Cholecalciferol (VITAMIN D3) 50 MCG (2000 UT) TABS Take 2,000 Units by mouth daily.     [provider]  lisinopril-hydrochlorothiazide (ZESTORETIC) 20-25 MG tablet TAKE ONE TABLET BY MOUTH EVERY DAY FOR BLOOD PRESSURE AND KIDNEY PROTECTION 08/15/21   Thedore Mins, Ria Comment, PA-C  metFORMIN (GLUCOPHAGE) 500 MG tablet Take 1 tablet (500 mg total) by mouth 2 (two) times daily with a meal. 08/12/21 11/10/21  Drubel, Ria Comment, PA-C  metoprolol succinate (TOPROL-XL) 50 MG 24 hr tablet TAKE 1 TABLET BY MOUTH EVERY DAY 09/27/21   Mikey Kirschner, PA-C   Multiple Vitamins-Minerals (PRESERVISION AREDS 2 PO) Take 1 capsule by mouth 2 (two) times daily.     [provider]  omeprazole (PRILOSEC) 40 MG capsule TAKE 1 CAPSULE BY MOUTH EVERY DAY 10/13/21   Mikey Kirschner, PA-C  tamsulosin (FLOMAX) 0.4 MG CAPS capsule TAKE 1 CAPSULE BY MOUTH EVERY DAY 05/24/21   Birdie Sons, MD    Physical Exam: Vitals:   11/14/21 1542 11/14/21 1548 11/14/21 1859  BP:  134/85 122/75  Pulse:  91 79  Resp:  18 18  Temp:  98.1 F (36.7 C)   TempSrc:  Oral   SpO2:  94% 95%  Weight: 93.1 kg    Height: 6' (1.829 m)     Physical Exam Vitals and nursing note reviewed.  Constitutional:      General: He is not in acute distress.    Appearance: Normal appearance.  HENT:     Head: Normocephalic and atraumatic.  Cardiovascular:     Rate and Rhythm: Normal rate and regular rhythm.     Pulses: Normal pulses.     Heart sounds: Normal heart sounds. No murmur heard. Pulmonary:     Effort: Pulmonary effort is normal.     Breath sounds: Normal breath sounds. No wheezing or rhonchi.  Abdominal:     General: Bowel sounds are normal.     Palpations: Abdomen is soft.     Tenderness: There is no abdominal tenderness.  Musculoskeletal:        General: No swelling or tenderness. Normal range of motion.     Cervical back: Normal range of motion and neck supple.  Skin:    General: Skin is warm and dry.  Neurological:     General: No focal deficit present.     Mental Status: He is alert. Mental status is at baseline.  Psychiatric:        Mood and Affect: Mood normal.        Behavior: Behavior normal.      Assessment and Plan: * Postural dizziness with presyncope Concern for arrhythmia/dysrhythmia in view of preceding palpitations Past history of PVCs.  Patient was started on metoprolol at the time Continuous cardiac monitoring, echocardiogram Fall precautions Cardiology consult in the a.m. if any overnight events versus outpatient  referral   Hypokalemia Potassium 3.3, likely related to thiazide diuretic Potassium supplementation  Systolic murmur- (present on admission) Patient with known systolic murmur since 2951 Follow-up echocardiogram  Diabetes mellitus type II, controlled ([place]) Continue metformin Sliding scale insulin coverage  PVC (premature ventricular contraction)- (present on admission) Continue metoprolol  HTN (hypertension) Continue metoprolol and lisinopril HCTZ       Advance Care Planning:   Code Status: Not on file   Consults: none  Family Communication: wife at bedside  Severity of Illness: The appropriate patient status for this patient is OBSERVATION. Observation status is judged to be reasonable and necessary in order to provide the required intensity of service to ensure the patient's safety. The patient's presenting symptoms, physical exam findings, and initial radiographic and laboratory data in the  context of their medical condition is felt to place them at decreased risk for further clinical deterioration. Furthermore, it is anticipated that the patient will be medically stable for discharge from the hospital within 2 midnights of admission.   Author: Athena Masse, MD 11/14/2021 10:18 PM  For on call review [email or web address].

## 2021-11-14 NOTE — Assessment & Plan Note (Signed)
Continue metformin Sliding scale insulin coverage

## 2021-11-14 NOTE — Assessment & Plan Note (Addendum)
Continue metoprolol at increased dose.

## 2021-11-14 NOTE — ED Provider Notes (Signed)
[facility] Provider Note    Event Date/Time   First MD Initiated Contact with Patient 11/14/21 2057     (approximate)   History   Palpitations   HPI  Joseph Cook is a 81 y.o. male presents the ER for evaluation of multiple near syncopal episodes occurring today associate with palpitations.  States he has a history of PVCs never seen a cardiologist before.  States his PCP recently told him that he has a heart murmur has never had an echocardiogram.  Due to these episodes reportedly became very flushed appearing but no true LOC.  He did not fall.  No headache no numbness or tingling.  No nausea or vomiting.  Was witnessed by his wife.     Physical Exam   Triage Vital Signs: ED Triage Vitals  Enc Vitals Group     BP 11/14/21 1548 134/85     Pulse Rate 11/14/21 1548 91     Resp 11/14/21 1548 18     Temp 11/14/21 1548 98.1 F (36.7 C)     Temp Source 11/14/21 1548 Oral     SpO2 11/14/21 1548 94 %     Weight 11/14/21 1542 205 lb 4 oz (93.1 kg)     Height 11/14/21 1542 6' (1.829 m)     Head Circumference --      Peak Flow --      Pain Score 11/14/21 1542 0     Pain Loc --      Pain Edu? --      Excl. in [place]? --     Most recent vital signs: Vitals:   11/14/21 1548 11/14/21 1859  BP: 134/85 122/75  Pulse: 91 79  Resp: 18 18  Temp: 98.1 F (36.7 C)   SpO2: 94% 95%     Constitutional: Alert  Eyes: Conjunctivae are normal.  Head: Atraumatic. Nose: No congestion/rhinnorhea. Mouth/Throat: Mucous membranes are moist.   Neck: Painless ROM.  Cardiovascular:   Good peripheral circulation.  Holosystolic murmur,  Respiratory: Normal respiratory effort.  No retractions.  Gastrointestinal: Soft and nontender.  Musculoskeletal:  no deformity Neurologic:  MAE spontaneously. No gross focal neurologic deficits are appreciated.  Skin:  Skin is warm, dry and intact. No rash noted. Psychiatric: Mood and affect are normal. Speech and behavior are  normal.    ED Results / Procedures / Treatments   Labs (all labs ordered are listed, but only abnormal results are displayed) Labs Reviewed  BASIC METABOLIC PANEL - Abnormal; Notable for the following components:      Result Value   Potassium 3.3 (*)    CO2 19 (*)    Glucose, Bld 180 (*)    BUN 24 (*)    Calcium 8.6 (*)    All other components within normal limits  RESP PANEL BY RT-PCR (FLU A&B, COVID) ARPGX2  CBC  TROPONIN I (HIGH SENSITIVITY)  TROPONIN I (HIGH SENSITIVITY)     EKG  ED ECG REPORT I, Merlyn Lot, the attending physician, personally viewed and interpreted this ECG.   Date: 11/14/2021  EKG Time: 15:44  Rate: 85  Rhythm: sinus  Axis: left  Intervals: normal  ST&T Change: no stemi, no depressions    RADIOLOGY Please see ED Course for my review and interpretation.  I personally reviewed all radiographic images ordered to evaluate for the above acute complaints and reviewed radiology reports and findings.  These findings were personally discussed with the patient.  Please see medical record for radiology  report.    PROCEDURES:  Critical Care performed:   Procedures   MEDICATIONS ORDERED IN ED: Medications  sodium chloride 0.9 % bolus 500 mL (500 mLs Intravenous New Bag/Given 11/14/21 2138)     IMPRESSION / MDM / ASSESSMENT AND PLAN / ED COURSE  I reviewed the triage vital signs and the nursing notes.                              Differential diagnosis includes, but is not limited to, ACS, dysrhythmia, electrolyte abnormality, dehydration, CHF, aortic stenosis, regurg  Patient presenting with several near syncopal episodes with associated palpitations.  Patient at high risk due to age and risk factors.  Do not feel that neuroimaging clinically indicated based on his presentation.  His EKG is nonischemic but does show sinus dysrhythmia.  He is not complaining of any chest pain at this time his troponin is negative.  However in the setting  of new murmur I do believe he should be admitted to the hospital for cardiac monitoring and echocardiogram.  Patient agreeable to plan.       FINAL CLINICAL IMPRESSION(S) / ED DIAGNOSES   Final diagnoses:  Near syncope  Palpitations     Rx / DC Orders   ED Discharge Orders     None        Note:  This document was prepared using Dragon voice recognition software and may include unintentional dictation errors.    Merlyn Lot, MD 11/14/21 2200

## 2021-11-14 NOTE — Assessment & Plan Note (Addendum)
Patient with known systolic murmur since 3976 Echo shows LVEF 60 to 65%.

## 2021-11-14 NOTE — Assessment & Plan Note (Addendum)
Continue Toprol and resume lisinopril.

## 2021-11-14 NOTE — Assessment & Plan Note (Signed)
>>  ASSESSMENT AND PLAN FOR DIABETES MELLITUS TYPE II, CONTROLLED (HCC) WRITTEN ON 11/14/2021 10:21 PM BY DUNCAN, HAZEL V, MD  Continue metformin Sliding scale insulin coverage

## 2021-11-14 NOTE — Telephone Encounter (Signed)
° °  Chief Complaint: Palpitations "PVCs" Symptoms: Heart skipping, dizziness Frequency: Onset 1 month ago,worsening past 2 days, presently Pertinent Negatives: Patient denies CP, pressure Disposition: [x] ED /[] Urgent Care (no appt availability in office) / [] Appointment(In office/virtual)/ []  [facility]/ [] Home Care/ [] Refused Recommended Disposition /[] [facility]/ []  Follow-up with PCP Additional Notes: Wife will drive, going to ED   Reason for Disposition  Dizziness, lightheadedness, or weakness  Answer Assessment - Initial Assessment Questions 1. DESCRIPTION: "Please describe your heart rate or heartbeat that you are having" (e.g., fast/slow, regular/irregular, skipped or extra beats, "palpitations")     "PVC's" 2. ONSET: "When did it start?" (Minutes, hours or days)      Few months ago, worsening 2 days 3. DURATION: "How long does it last" (e.g., seconds, minutes, hours)     Frequently 4. PATTERN "Does it come and go, or has it been constant since it started?"  "Does it get worse with exertion?"   "Are you feeling it now?"      5. TAP: "Using your hand, can you tap out what you are feeling on a chair or table in front of you, so that I can hear?" (Note: not all patients can do this)        6. HEART RATE: "Can you tell me your heart rate?" "How many beats in 15 seconds?"  (Note: not all patients can do this)        7. RECURRENT SYMPTOM: "Have you ever had this before?" If Yes, ask: "When was the last time?" and "What happened that time?"       8. CAUSE: "What do you think is causing the palpitations?"      9. CARDIAC HISTORY: "Do you have any history of heart disease?" (e.g., heart attack, angina, bypass surgery, angioplasty, arrhythmia)       10. OTHER SYMPTOMS: "Do you have any other symptoms?" (e.g., dizziness, chest pain, sweating, difficulty breathing)       Dizziness  Protocols used: Heart Rate and Heartbeat Questions-A-AH

## 2021-11-14 NOTE — ED Triage Notes (Signed)
Pt here with palpitations. Pt states that he felt like his heart has been racing for the past few days. Pt in NAD on arrival to ED with wife.

## 2021-11-15 ENCOUNTER — Encounter: Payer: Self-pay | Admitting: Internal Medicine

## 2021-11-15 ENCOUNTER — Observation Stay (HOSPITAL_COMMUNITY)
Admit: 2021-11-15 | Discharge: 2021-11-15 | Disposition: A | Discharge status: Home / Self Care | Payer: Medicare Other | Attending: Internal Medicine | Admitting: Internal Medicine

## 2021-11-15 DIAGNOSIS — Z8249 Family history of ischemic heart disease and other diseases of the circulatory system: Secondary | ICD-10-CM | POA: Diagnosis not present

## 2021-11-15 DIAGNOSIS — R011 Cardiac murmur, unspecified: Secondary | ICD-10-CM

## 2021-11-15 DIAGNOSIS — R55 Syncope and collapse: Secondary | ICD-10-CM

## 2021-11-15 DIAGNOSIS — E119 Type 2 diabetes mellitus without complications: Secondary | ICD-10-CM

## 2021-11-15 DIAGNOSIS — H409 Unspecified glaucoma: Secondary | ICD-10-CM | POA: Diagnosis present

## 2021-11-15 DIAGNOSIS — Z820 Family history of epilepsy and other diseases of the nervous system: Secondary | ICD-10-CM | POA: Diagnosis not present

## 2021-11-15 DIAGNOSIS — I493 Ventricular premature depolarization: Secondary | ICD-10-CM | POA: Diagnosis not present

## 2021-11-15 DIAGNOSIS — T502X5A Adverse effect of carbonic-anhydrase inhibitors, benzothiadiazides and other diuretics, initial encounter: Secondary | ICD-10-CM | POA: Diagnosis present

## 2021-11-15 DIAGNOSIS — Z87891 Personal history of nicotine dependence: Secondary | ICD-10-CM | POA: Diagnosis not present

## 2021-11-15 DIAGNOSIS — R000 Tachycardia, unspecified: Secondary | ICD-10-CM | POA: Insufficient documentation

## 2021-11-15 DIAGNOSIS — I471 Supraventricular tachycardia: Secondary | ICD-10-CM

## 2021-11-15 DIAGNOSIS — Z20822 Contact with and (suspected) exposure to covid-19: Secondary | ICD-10-CM | POA: Diagnosis present

## 2021-11-15 DIAGNOSIS — E876 Hypokalemia: Secondary | ICD-10-CM | POA: Diagnosis not present

## 2021-11-15 DIAGNOSIS — Z823 Family history of stroke: Secondary | ICD-10-CM | POA: Diagnosis not present

## 2021-11-15 DIAGNOSIS — Z85828 Personal history of other malignant neoplasm of skin: Secondary | ICD-10-CM | POA: Diagnosis not present

## 2021-11-15 DIAGNOSIS — I890 Lymphedema, not elsewhere classified: Secondary | ICD-10-CM

## 2021-11-15 DIAGNOSIS — E785 Hyperlipidemia, unspecified: Secondary | ICD-10-CM | POA: Diagnosis present

## 2021-11-15 DIAGNOSIS — I10 Essential (primary) hypertension: Secondary | ICD-10-CM | POA: Diagnosis present

## 2021-11-15 LAB — CBG MONITORING, ED
Glucose-Capillary: 180 mg/dL — ABNORMAL HIGH (ref 70–99)
Glucose-Capillary: 177 mg/dL — ABNORMAL HIGH (ref 70–99)

## 2021-11-15 LAB — ECHOCARDIOGRAM COMPLETE
AR max vel: 3.81 cm2
AV Area VTI: 5.67 cm2
AV Area mean vel: 4.71 cm2
AV Mean grad: 2.8 mmHg
AV Peak grad: 4.8 mmHg
Ao pk vel: 1.1 m/s
Area-P 1/2: 2.23 cm2
Height: 72 in
MV VTI: 4.28 cm2
S' Lateral: 2.7 cm
Weight: 3283.97 oz

## 2021-11-15 LAB — CBC
HCT: 41.6 % (ref 39.0–52.0)
Hemoglobin: 14.6 g/dL (ref 13.0–17.0)
MCH: 30 pg (ref 26.0–34.0)
MCHC: 35.1 g/dL (ref 30.0–36.0)
MCV: 85.4 fL (ref 80.0–100.0)
Platelets: 148 10*3/uL — ABNORMAL LOW (ref 150–400)
RBC: 4.87 MIL/uL (ref 4.22–5.81)
RDW: 13.1 % (ref 11.5–15.5)
WBC: 7 10*3/uL (ref 4.0–10.5)
nRBC: 0 % (ref 0.0–0.2)

## 2021-11-15 LAB — BASIC METABOLIC PANEL
Anion gap: 11 (ref 5–15)
BUN: 24 mg/dL — ABNORMAL HIGH (ref 8–23)
CO2: 22 mmol/L (ref 22–32)
Calcium: 8.5 mg/dL — ABNORMAL LOW (ref 8.9–10.3)
Chloride: 105 mmol/L (ref 98–111)
Creatinine, Ser: 0.75 mg/dL (ref 0.61–1.24)
GFR, Estimated: >60 mL/min (ref >60)
Glucose, Bld: 159 mg/dL — ABNORMAL HIGH (ref 70–99)
Potassium: 3.6 mmol/L (ref 3.5–5.1)
Sodium: 138 mmol/L (ref 135–145)

## 2021-11-15 LAB — D-DIMER, QUANTITATIVE: D-Dimer, Quant: <0.27 ug/mL-FEU (ref 0.00–0.50)

## 2021-11-15 LAB — GLUCOSE, CAPILLARY: Glucose-Capillary: 147 mg/dL — ABNORMAL HIGH (ref 70–99)

## 2021-11-15 LAB — MAGNESIUM: Magnesium: 2 mg/dL (ref 1.7–2.4)

## 2021-11-15 MED ORDER — METOPROLOL SUCCINATE ER 100 MG PO TB24
100.0000 mg | ORAL_TABLET | Freq: Every day | ORAL | Status: DC
  Filled 2021-11-15: qty 1

## 2021-11-15 MED ORDER — TAMSULOSIN HCL 0.4 MG PO CAPS
0.4000 mg | ORAL_CAPSULE | Freq: Every day | ORAL | Status: DC
  Administered 2021-11-15: 0.4 mg via ORAL
  Filled 2021-11-15: qty 1

## 2021-11-15 MED ORDER — SODIUM CHLORIDE 0.9 % IV SOLN: INTRAVENOUS | Status: DC

## 2021-11-15 MED ORDER — DEXTROSE 50 % IV SOLN
INTRAVENOUS | Status: AC
  Filled 2021-11-15: qty 50

## 2021-11-15 MED ORDER — LISINOPRIL 20 MG PO TABS
20.0000 mg | ORAL_TABLET | Freq: Every day | ORAL | Status: DC
  Administered 2021-11-15 – 2021-11-16 (×2): 20 mg via ORAL
  Filled 2021-11-15: qty 1
  Filled 2021-11-15: qty 2

## 2021-11-15 MED ORDER — METOPROLOL SUCCINATE ER 50 MG PO TB24
50.0000 mg | ORAL_TABLET | Freq: Once | ORAL | Status: AC
  Administered 2021-11-15: 50 mg via ORAL
  Filled 2021-11-15: qty 1

## 2021-11-15 MED ORDER — SODIUM CHLORIDE 0.9 % IV BOLUS
500.0000 mL | Freq: Once | INTRAVENOUS | Status: AC
  Administered 2021-11-15: 500 mL via INTRAVENOUS

## 2021-11-15 NOTE — Consult Note (Signed)
Cardiology Consultation:   Patient ID: Joseph Cook; [id number]; 1940/11/18   Admit date: 11/14/2021 Date of Consult: 11/15/2021  Primary Care Provider: Mikey Kirschner, PA-C Primary Cardiologist: New to [facility] - consult by End Primary Electrophysiologist:  None   Patient Profile:   Joseph Cook is a 81 y.o. male with a hx of palpitations with PVCs dating back to the 1970s, cardiac murmur, DM2, HTN, and HLD who is being seen today for the evaluation of presyncope at the request of Dr. Leslye Peer.  History of Present Illness:   [name] reports a long history of PVCs that he indicates were initially diagnosed in the 69s.  He also reports a history of a cardiac murmur without prior imaging available for review.  More recently, he reports an episode of dizziness with near syncope while playing golf back in the summer that improved with resting.  He did not seek medical care at that time.  Over the past couple of weeks, he has noted an increase in his palpitation burden with episodes lasting for a longer duration than they typically do.  He was recently ill with gastroenteritis with associated nausea, vomiting, and watery diarrhea this past weekend.  In this setting, he noted an increase in his palpitation burden with associated near syncope without frank syncope, diaphoresis, dyspnea, or chest pain.  Due to ongoing symptoms, he presented to the ED last evening.  Vital signs were stable upon presentation.  He was noted to be in sinus rhythm with PACs on EKG as outlined below.  High-sensitivity troponin negative x2.  Initial potassium 3.3.  Chest x-ray without acute cardiopulmonary process.  While ambulating to x-ray, he reported an episode of palpitations with associated presyncope yesterday evening.  This morning, while ambulating to the restroom in his room, he again had an episode of palpitations with associated presyncope.  Review of telemetry at this time demonstrates atrial  tachycardia/SVT which spontaneously terminated back to sinus rhythm.  With this, primary service has titrated his metoprolol to 100 mg daily.  This morning, potassium is improved at 3.6.  TSH is normal.  Hemoglobin normal.  D-dimer pending.  Currently, he is without symptoms.   Past Medical History:  Diagnosis Date   Actinic keratosis    Basal cell carcinoma    Cataract    Diabetes mellitus    Dysplastic nevus 08/13/2009   right medial lower leg above medial ankle, malleolus -severe   Glaucoma    Hyperlipidemia    Hypertension    PVC (premature ventricular contraction)     Past Surgical History:  Procedure Laterality Date   HERNIA REPAIR     ROTATOR CUFF REPAIR       Home Meds: Prior to Admission medications   Medication Sig Start Date End Date Taking? Authorizing Provider  amoxicillin-clavulanate (AUGMENTIN) 875-125 MG tablet Take 1 tablet by mouth 2 (two) times daily. 10/06/21   Mikey Kirschner, PA-C  aspirin 325 MG tablet Take 325 mg by mouth daily.    [provider]  atorvastatin (LIPITOR) 10 MG tablet TAKE 1 TABLET BY MOUTH EVERYDAY AT BEDTIME 08/04/21   Drubel, Ria Comment, PA-C  Cholecalciferol (VITAMIN D3) 50 MCG (2000 UT) TABS Take 2,000 Units by mouth daily.     [provider]  lisinopril-hydrochlorothiazide (ZESTORETIC) 20-25 MG tablet TAKE ONE TABLET BY MOUTH EVERY DAY FOR BLOOD PRESSURE AND KIDNEY PROTECTION 08/15/21   Thedore Mins, Ria Comment, PA-C  metFORMIN (GLUCOPHAGE) 500 MG tablet Take 1 tablet (500 mg total) by mouth 2 (  two) times daily with a meal. 08/12/21 11/10/21  Drubel, Ria Comment, PA-C  metoprolol succinate (TOPROL-XL) 50 MG 24 hr tablet TAKE 1 TABLET BY MOUTH EVERY DAY 09/27/21   Mikey Kirschner, PA-C  Multiple Vitamins-Minerals (PRESERVISION AREDS 2 PO) Take 1 capsule by mouth 2 (two) times daily.     [provider]  omeprazole (PRILOSEC) 40 MG capsule TAKE 1 CAPSULE BY MOUTH EVERY DAY 10/13/21   Mikey Kirschner, PA-C  tamsulosin (FLOMAX) 0.4  MG CAPS capsule TAKE 1 CAPSULE BY MOUTH EVERY DAY 05/24/21   Birdie Sons, MD    Inpatient Medications: Scheduled Meds:  atorvastatin  20 mg Oral Daily   dextrose       enoxaparin (LOVENOX) injection  40 mg Subcutaneous Q24H   insulin aspart  0-15 Units Subcutaneous TID WC   insulin aspart  0-5 Units Subcutaneous QHS   lisinopril  20 mg Oral Daily   metFORMIN  500 mg Oral BID WC   [START ON 11/16/2021] metoprolol succinate  100 mg Oral Daily   sodium chloride flush  3 mL Intravenous Q12H   tamsulosin  0.4 mg Oral QPC supper   Continuous Infusions:  sodium chloride 50 mL/hr at 11/15/21 4268   PRN Meds: acetaminophen **OR** acetaminophen, ondansetron **OR** ondansetron (ZOFRAN) IV  Allergies:  No Known Allergies  Social History:   Social History   Socioeconomic History   Marital status: Married    Spouse name: Not on file   Number of children: 5   Years of education: Not on file   Highest education level: Some college, no degree  Occupational History   Occupation: retired  Tobacco Use   Smoking status: Never   Smokeless tobacco: Former  Substance and Sexual Activity   Alcohol use: Not Currently   Drug use: No   Sexual activity: Not on file  Other Topics Concern   Not on file  Social History Narrative   Not on file   Social Determinants of Health   Financial Resource Strain: Not on file  Food Insecurity: Not on file  Transportation Needs: Not on file  Physical Activity: Not on file  Stress: Not on file  Social Connections: Not on file  Intimate Partner Violence: Not on file     Family History:   Family History  Problem Relation Age of Onset   Stroke Mother    Heart attack Father    Alzheimer's disease Maternal Aunt    Heart attack Maternal Uncle    Cancer Maternal Grandmother    Cancer Paternal Grandmother     ROS:  Review of Systems  Constitutional:  Positive for malaise/fatigue. Negative for chills, diaphoresis, fever and weight loss.  HENT:   Negative for congestion.   Eyes:  Negative for discharge and redness.  Respiratory:  Negative for cough, sputum production, shortness of breath and wheezing.   Cardiovascular:  Positive for palpitations. Negative for chest pain, orthopnea, claudication, leg swelling and PND.  Gastrointestinal:  Negative for abdominal pain, heartburn, nausea and vomiting.  Musculoskeletal:  Negative for falls and myalgias.  Skin:  Negative for rash.  Neurological:  Positive for dizziness. Negative for tingling, tremors, sensory change, speech change, focal weakness, loss of consciousness and weakness.       Near syncope  Endo/Heme/Allergies:  Does not bruise/bleed easily.  Psychiatric/Behavioral:  Negative for substance abuse. The patient is not nervous/anxious.   All other systems reviewed and are negative.    Physical Exam/Data:   Vitals:   11/15/21 0300 11/15/21  0800 11/15/21 1026 11/15/21 1054  BP: 125/78  (!) 146/129 136/82  Pulse: 63  (!) 129 88  Resp: 17  (!) 21 20  Temp:  98.7 F (37.1 C) 98.6 F (37 C) 98.7 F (37.1 C)  TempSrc:  Oral Oral Oral  SpO2: 95%  95% 95%  Weight:      Height:       No intake or output data in the 24 hours ending 11/15/21 1220 Filed Weights   11/14/21 1542  Weight: 93.1 kg   Body mass index is 27.84 kg/m.   Physical Exam: General: Well developed, well nourished, in no acute distress. Head: Normocephalic, atraumatic, sclera non-icteric, no xanthomas, nares without discharge.  Neck: Negative for carotid bruits. JVD not elevated. Lungs: Clear bilaterally to auscultation without wheezes, rales, or rhonchi. Breathing is unlabored. Heart: RRR with S1 S2. II/VI systolic murmur RUSB, no rubs, or gallops appreciated. Abdomen: Soft, non-tender, non-distended with normoactive bowel sounds. No hepatomegaly. No rebound/guarding. No obvious abdominal masses. Msk:  Strength and tone appear normal for age. Extremities: No clubbing or cyanosis. No edema. Distal pedal  pulses are 2+ and equal bilaterally. Neuro: Alert and oriented X 3. No facial asymmetry. No focal deficit. Moves all extremities spontaneously. Psych:  Responds to questions appropriately with a normal affect.   EKG:  The EKG was personally reviewed and demonstrates: NSR, 88 bpm, PACs, poor R wave progression along the precordial leads, possible prior anterior infarct vs lead placement, no prior for comparison Telemetry:  Telemetry was personally reviewed and demonstrates: SR with PACs and PVCs with several brief episodes of atrial tach/SVT, last occurring this morning around 10:25  Weights: Filed Weights   11/14/21 1542  Weight: 93.1 kg    Relevant CV Studies:  2D echo pending with preliminary review concerning for possible bicuspid aortic valve  Laboratory Data:  Chemistry Recent Labs  Lab 11/14/21 1543 11/14/21 2259 11/15/21 0533  NA 135  --  138  K 3.3*  --  3.6  CL 104  --  105  CO2 19*  --  22  GLUCOSE 180*  --  159*  BUN 24*  --  24*  CREATININE 0.95 0.71 0.75  CALCIUM 8.6*  --  8.5*  GFRNONAA >60 >60 >60  ANIONGAP 12  --  11    No results for input(s): PROT, ALBUMIN, AST, ALT, ALKPHOS, BILITOT in the last 168 hours. Hematology Recent Labs  Lab 11/14/21 1543 11/15/21 0533  WBC 7.0 7.0  RBC 5.46 4.87  HGB 16.0 14.6  HCT 46.5 41.6  MCV 85.2 85.4  MCH 29.3 30.0  MCHC 34.4 35.1  RDW 12.9 13.1  PLT 160 148*   Cardiac EnzymesNo results for input(s): TROPONINI in the last 168 hours. No results for input(s): TROPIPOC in the last 168 hours.  BNPNo results for input(s): BNP, PROBNP in the last 168 hours.  DDimer No results for input(s): DDIMER in the last 168 hours.  Radiology/Studies:  DG Chest 2 View  Result Date: 11/14/2021 IMPRESSION: No acute process in the chest. Electronically Signed   By: Macy Mis M.D.   On: 11/14/2021 16:33    Assessment and Plan:   1. Presyncope: -Appears to be in the context of runs of atrial tachycardia with possible  underlying bicuspid aortic valve with moderate stenosis, and exacerbated by volume depletion with recent gastroenteritis  -Monitor on telemetry  -Obtain orthostatics  2. Atrial tachycardia/SVT: -Currently maintaining sinus rhythm -Agree with titration of metoprolol succinate  -Possibly exacerbated  by his recent gastroenteritis with recent vomiting and diarrhea  -Plan for Zio patch in the outpatient setting to quantify burden on titrated dose of metoprolol -Ideally, would like to avoid AAD -Echo pending -TSH normal  3. Cardiac murmur with possible bicuspid aortic valve: -Await formal read of echo -Outpatient work-up  4. Hypokalemia: -Improving, replete to goal 4.0 -Check magnesium with recommendation to replete to goal 2.0 as indicated       For questions or updates, please contact [place] Please consult [email or web address] for contact info under Cardiology/STEMI.   Signed, Christell Faith, PA-C [place] Pager: [phone] 11/15/2021, 12:20 PM

## 2021-11-15 NOTE — Progress Notes (Signed)
*  PRELIMINARY RESULTS* Echocardiogram 2D Echocardiogram has been performed.  Joseph Cook 11/15/2021, 12:03 PM

## 2021-11-15 NOTE — Assessment & Plan Note (Addendum)
Patient with APCs seen on EKG.  Patient with PVCs on telemetry monitoring.  Had an episode of tachycardia,  looks like sinus tachycardia but could be SVT with walking back from the bathroom.  Increased Toprol-XL to 100 mg daily.  Echocardiogram unremarkable. cardiology consultation.  Patient cleared from cardiology on Zio patch monitor.

## 2021-11-15 NOTE — Progress Notes (Addendum)
Progress Note   Patient: Joseph Cook ENI:[id number] DOB: 25-Nov-1940 DOA: 11/14/2021     0 DOS: the patient was seen and examined on 11/15/2021   Brief hospital course: 81 year old man with past medical history of type 2 diabetes mellitus, glaucoma, hyperlipidemia, hypertension and PVCs.  The patient has a history of PVCs for a long time and is on metoprolol for this.  2 to 3 weeks ago he got dizzy and lightheaded and then sat down and it went away.  Yesterday a.m. happened again and was told to come into the hospital.  In the hospital had an episode of fast heart rate dizziness and lightheadedness after walking back and forth to the bathroom.  Assessment and Plan: * Near syncope Patient with APCs seen on EKG.  Patient with PVCs on telemetry monitoring.  Had an episode of tachycardia looks like sinus tachycardia but could be SVT with walking back from the bathroom.  Will increase Toprol-XL to 100 mg daily.  Echocardiogram pending.  Cardiology consultation. We will check a D-dimer.  The patient is not orthostatic.  PVC (premature ventricular contraction)- (present on admission) Continue metoprolol at increased dose.  HTN (hypertension) Increase Toprol-XL to 100 mg daily extra 50 mg today.  Restart lisinopril.  Hold hydrochlorothiazide  Diabetes mellitus type II, controlled ([place]) Continue metformin Sliding scale insulin coverage  Systolic murmur- (present on admission) Patient with known systolic murmur since 6144 Follow-up echocardiogram  Lymphedema Right leg chronic lymphedema for 25 years  Hypokalemia Potassium 3.3 on presentation up to 3.6..  Hold hydrochlorothiazide.        Subjective: Patient had another episode of lightheadedness dizziness and fast heart rate with walking back from the bathroom today.  Came in with symptoms of lightheadedness dizziness and palpitations.  Physical Exam: Vitals:   11/15/21 0300 11/15/21 0800 11/15/21 1026 11/15/21 1054  BP: 125/78   (!) 146/129 136/82  Pulse: 63  (!) 129 88  Resp: 17  (!) 21 20  Temp:  98.7 F (37.1 C) 98.6 F (37 C) 98.7 F (37.1 C)  TempSrc:  Oral Oral Oral  SpO2: 95%  95% 95%  Weight:      Height:       Physical Exam HENT:     Head: Normocephalic.     Mouth/Throat:     Pharynx: No oropharyngeal exudate.  Eyes:     General: Lids are normal.     Conjunctiva/sclera: Conjunctivae normal.  Cardiovascular:     Rate and Rhythm: Normal rate and regular rhythm.     Heart sounds: S1 normal and S2 normal. Murmur heard.  Systolic murmur is present with a grade of 2/6.  Pulmonary:     Breath sounds: Normal breath sounds. No decreased breath sounds, wheezing, rhonchi or rales.  Abdominal:     Palpations: Abdomen is soft.     Tenderness: There is no abdominal tenderness.  Musculoskeletal:     Right lower leg: Swelling present.     Left lower leg: No swelling.  Skin:    General: Skin is warm.     Findings: No rash.  Neurological:     Mental Status: He is alert and oriented to person, place, and time.     Data Reviewed: Potassium went from 3.3 up to 3.6.  Oral  Family Communication: Spoke with wife at the bedside  Disposition: Status is: Observation The patient remains OBS appropriate and will d/c before 2 midnights.  Planned Discharge Destination: Home  Author: Loletha Grayer, MD 11/15/2021 12:16 PM  For on call review [email or web address].

## 2021-11-15 NOTE — ED Notes (Signed)
Informed RN bed assigned 

## 2021-11-15 NOTE — Assessment & Plan Note (Addendum)
Right leg : chronic lymphedema for 25 years

## 2021-11-15 NOTE — Hospital Course (Addendum)
This 81 year old man with past medical history of type 2 diabetes mellitus, glaucoma, hyperlipidemia, hypertension and PVCs.  The patient has a history of PVCs for a long time and is on metoprolol for this.  2 to 3 weeks ago he got dizzy and lightheaded and then sat down and it went away.  Yesterday it  happened again and was told to come into the hospital. In the hospital had an episode of fast heart rate,  dizziness and lightheadedness after walking back and forth to the bathroom.  Patient was admitted for presyncopal episode.  Patient was evaluated by cardiology,  Patient had echocardiogram which was normal.  LVEF normal.  D-dimer normal.  Patient was cleared from cardiology.  Patient is being discharged home with Zio patch monitoring.

## 2021-11-15 NOTE — Plan of Care (Signed)

## 2021-11-16 ENCOUNTER — Other Ambulatory Visit: Payer: Self-pay | Admitting: Family Medicine

## 2021-11-16 ENCOUNTER — Inpatient Hospital Stay (HOSPITAL_COMMUNITY)
Admit: 2021-11-16 | Discharge: 2021-11-16 | Disposition: A | Discharge status: Home / Self Care | Payer: Medicare Other | Attending: Physician Assistant | Admitting: Physician Assistant

## 2021-11-16 DIAGNOSIS — I10 Essential (primary) hypertension: Secondary | ICD-10-CM

## 2021-11-16 DIAGNOSIS — I471 Supraventricular tachycardia, unspecified: Secondary | ICD-10-CM

## 2021-11-16 DIAGNOSIS — R55 Syncope and collapse: Secondary | ICD-10-CM | POA: Diagnosis not present

## 2021-11-16 LAB — BASIC METABOLIC PANEL WITH GFR
Anion gap: 8 (ref 5–15)
BUN: 23 mg/dL (ref 8–23)
CO2: 25 mmol/L (ref 22–32)
Calcium: 8.4 mg/dL — ABNORMAL LOW (ref 8.9–10.3)
Chloride: 104 mmol/L (ref 98–111)
Creatinine, Ser: 0.67 mg/dL (ref 0.61–1.24)
GFR, Estimated: >60 mL/min
Glucose, Bld: 150 mg/dL — ABNORMAL HIGH (ref 70–99)
Potassium: 3.4 mmol/L — ABNORMAL LOW (ref 3.5–5.1)
Sodium: 137 mmol/L (ref 135–145)

## 2021-11-16 LAB — HEMOGLOBIN A1C
Hgb A1c MFr Bld: 8.7 % — ABNORMAL HIGH (ref 4.8–5.6)
Mean Plasma Glucose: 203 mg/dL

## 2021-11-16 LAB — GLUCOSE, CAPILLARY
Glucose-Capillary: 131 mg/dL — ABNORMAL HIGH (ref 70–99)
Glucose-Capillary: 122 mg/dL — ABNORMAL HIGH (ref 70–99)

## 2021-11-16 LAB — MAGNESIUM: Magnesium: 1.7 mg/dL (ref 1.7–2.4)

## 2021-11-16 MED ORDER — MAGNESIUM OXIDE -MG SUPPLEMENT 400 (240 MG) MG PO TABS
400.0000 mg | ORAL_TABLET | Freq: Every day | ORAL | Status: DC
Start: 2021-11-16 — End: 2021-11-16
  Administered 2021-11-16: 400 mg via ORAL
  Filled 2021-11-16: qty 1

## 2021-11-16 MED ORDER — POTASSIUM CHLORIDE 20 MEQ PO PACK
40.0000 meq | PACK | Freq: Once | ORAL | Status: DC
  Filled 2021-11-16: qty 2

## 2021-11-16 MED ORDER — LISINOPRIL 20 MG PO TABS: 20.0000 mg | ORAL_TABLET | Freq: Every day | ORAL | 1 refills | Status: DC

## 2021-11-16 MED ORDER — POTASSIUM CHLORIDE CRYS ER 20 MEQ PO TBCR
40.0000 meq | EXTENDED_RELEASE_TABLET | Freq: Two times a day (BID) | ORAL | Status: DC
  Administered 2021-11-16: 40 meq via ORAL
  Filled 2021-11-16: qty 2

## 2021-11-16 MED ORDER — METOPROLOL SUCCINATE ER 100 MG PO TB24: 100.0000 mg | ORAL_TABLET | Freq: Every day | ORAL | 1 refills | Status: DC

## 2021-11-16 NOTE — Progress Notes (Signed)
PIV removed. Discharge instructions completed. Patient verbalized understanding of medication regimen, follow up appointments and discharge instructions. Patient belongings gathered and packed to discharge.  

## 2021-11-16 NOTE — Discharge Instructions (Signed)
Advised to follow-up with primary care physician in 1 week.   Patient is being discharged with Zio patch monitoring. Advised to take lisinopril 20 mg and metoprolol 100 mg daily

## 2021-11-16 NOTE — Progress Notes (Signed)
°  Transition of Care [facility]) Screening Note   Patient Details  Name: Zahid Carneiro Date of Birth: Oct 02, 1940   Transition of Care [facility]) CM/SW Contact:    Alberteen Sam, LCSW Phone Number: 11/16/2021, 7:59 AM    Transition of Care Department [facility]) has reviewed patient and no TOC needs have been identified at this time. We will continue to monitor patient advancement through interdisciplinary progression rounds. If new patient transition needs arise, please place a TOC consult.  [place], [place]

## 2021-11-16 NOTE — Progress Notes (Signed)
Progress Note  Patient Name: Joseph Cook Date of Encounter: 11/16/2021  Primary Cardiologist: New to [facility] - consult by End  Subjective   No palpitations, dizziness, presyncope, syncope, or angina. No further episodes of atrial tachycardia/SVT on tele. 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 Diarrhea returned yesterday with 3 watery BMs. Would like to go home today.   Inpatient Medications    Scheduled Meds:  atorvastatin  20 mg Oral Daily   enoxaparin (LOVENOX) injection  40 mg Subcutaneous Q24H   insulin aspart  0-15 Units Subcutaneous TID WC   insulin aspart  0-5 Units Subcutaneous QHS   lisinopril  20 mg Oral Daily   metFORMIN  500 mg Oral BID WC   metoprolol succinate  100 mg Oral Daily   sodium chloride flush  3 mL Intravenous Q12H   tamsulosin  0.4 mg Oral QPC supper   Continuous Infusions:  sodium chloride 50 mL/hr at 11/15/21 0822   PRN Meds: acetaminophen **OR** acetaminophen, ondansetron **OR** ondansetron (ZOFRAN) IV   Vital Signs    Vitals:   11/15/21 2100 11/15/21 2323 11/16/21 0409 11/16/21 0754  BP:  134/71 133/77 (!) 145/75  Pulse:  (!) 59 (!) 59 (!) 55  Resp: 20 15 17 18   Temp:  98.5 F (36.9 C) 98.7 F (37.1 C) 97.6 F (36.4 C)  TempSrc:      SpO2:  95% 95% 100%  Weight:   91.8 kg   Height:        Intake/Output Summary (Last 24 hours) at 11/16/2021 0843 Last data filed at 11/15/2021 1700 Gross per 24 hour  Intake 671.67 ml  Output --  Net 671.67 ml   Filed Weights   11/14/21 1542 11/15/21 1711 11/16/21 0409  Weight: 93.1 kg 91.6 kg 91.8 kg    Telemetry    SR with PACs and artifact - Personally Reviewed  ECG    No new tracings - Personally Reviewed  Physical Exam   GEN: No acute distress.   Neck: No JVD. Cardiac: RRR, II/VI systolic murmur RUSB, no rubs, or gallops.  Respiratory: Clear to auscultation bilaterally.  GI: Soft, nontender, non-distended.   MS: No edema; No  deformity. Neuro:  Alert and oriented x 3; Nonfocal.  Psych: Normal affect.  Labs    Chemistry Recent Labs  Lab 11/14/21 1543 11/14/21 2259 11/15/21 0533 11/16/21 0514  NA 135  --  138 137  K 3.3*  --  3.6 3.4*  CL 104  --  105 104  CO2 19*  --  22 25  GLUCOSE 180*  --  159* 150*  BUN 24*  --  24* 23  CREATININE 0.95 0.71 0.75 0.67  CALCIUM 8.6*  --  8.5* 8.4*  GFRNONAA >60 >60 >60 >60  ANIONGAP 12  --  11 8     Hematology Recent Labs  Lab 11/14/21 1543 11/15/21 0533  WBC 7.0 7.0  RBC 5.46 4.87  HGB 16.0 14.6  HCT 46.5 41.6  MCV 85.2 85.4  MCH 29.3 30.0  MCHC 34.4 35.1  RDW 12.9 13.1  PLT 160 148*    Cardiac EnzymesNo results for input(s): TROPONINI in the last 168 hours. No results for input(s): TROPIPOC in the last 168 hours.   BNPNo results for input(s): BNP, PROBNP in the last 168 hours.   DDimer  Recent Labs  Lab 11/15/21 1711  DDIMER <0.27     Radiology    DG Chest 2 View  Result Date:  11/14/2021 IMPRESSION: No acute process in the chest. Electronically Signed   By: Macy Mis M.D.   On: 11/14/2021 16:33   Cardiac Studies   2D echo 11/15/2021: 1. Left ventricular ejection fraction, by estimation, is 65 to 70%. The  left ventricle has normal function. Left ventricular endocardial border  not optimally defined to evaluate regional wall motion. There is moderate  left ventricular hypertrophy. Left  ventricular diastolic parameters are consistent with Grade I diastolic  dysfunction (impaired relaxation).   2. Right ventricular systolic function is normal. The right ventricular  size is normal.   3. Left atrial size was mildly dilated.   4. Right atrial size was mildly dilated.   5. The mitral valve was not well visualized. No evidence of mitral valve  regurgitation. No evidence of mitral stenosis.   6. The aortic valve was not well visualized, though it is suspicious for  a congenitally malformed valve. There is moderate calcification of  the  aortic valve. There is moderate thickening of the aortic valve. Aortic  valve regurgitation is not visualized.   The aortic valve is sclerotic without obvious stenosis, though suboptimal  windows could lead to underestimation of aortic valve gradients.  Patient Profile     81 y.o. male with history of palpitations with PVCs dating back to the 1970s, cardiac murmur, DM2, HTN, and HLD who is being seen today for the evaluation of presyncope at the request of Dr. Leslye Peer.  Assessment & Plan    1. Presyncope: -Appears to be in the context of runs of atrial tachycardia with possible underlying congenitally malformed aortic valve with concern for underestimation of stenosis, and exacerbated by volume depletion with recent gastroenteritis  -Monitor on telemetry    2. Atrial tachycardia/SVT: -Currently maintaining sinus rhythm, no further episodes noted on tele -Continue titrated dose of metoprolol succinate 100 mg  -Possibly exacerbated by his recent gastroenteritis with recent vomiting and diarrhea  -Plan for Zio patch at discharge to quantify burden on titrated dose of metoprolol, IM to let us know discharge plan -Ideally, would like to avoid AAD -Echo as above -TSH normal   3. 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 Hypokalemia: -Replete to goal 4.0 -Replete magnesium to goal 2.0  5. Diarrhea: -Per primary service     For questions or updates, please contact [place] Please consult [email or web address] for contact info under Cardiology/STEMI.    Signed, Christell Faith, PA-C [place] Pager: [phone] 11/16/2021, 8:43 AM

## 2021-11-16 NOTE — Telephone Encounter (Signed)
Requested Prescriptions  Pending Prescriptions Disp Refills   tamsulosin (FLOMAX) 0.4 MG CAPS capsule [Pharmacy Med Name: TAMSULOSIN HCL 0.4 MG CAPSULE] 90 capsule 0    Sig: TAKE 1 CAPSULE BY MOUTH EVERY DAY     Urology: Alpha-Adrenergic Blocker Passed - 11/16/2021  1:49 AM      Passed - PSA in normal range and within 360 days    Prostate Specific Ag, Serum  Date Value Ref Range Status  02/18/2021 1.0 0.0 - 4.0 ng/mL Final    Comment:    Roche ECLIA methodology. According to the American Urological Association, Serum PSA should decrease and remain at undetectable levels after radical prostatectomy. The AUA defines biochemical recurrence as an initial PSA value 0.2 ng/mL or greater followed by a subsequent confirmatory PSA value 0.2 ng/mL or greater. Values obtained with different assay methods or kits cannot be used interchangeably. Results cannot be interpreted as absolute evidence of the presence or absence of malignant disease.          Passed - Last BP in normal range    BP Readings from Last 1 Encounters:  11/16/21 (!) 145/75         Passed - Valid encounter within last 12 months    Recent Outpatient Visits          1 month ago Acute non-recurrent frontal sinusitis   [facility] Thedore Mins, [place], PA-C   3 months ago Controlled type 2 diabetes mellitus without complication, without long-term current use of insulin ([place])   [facility] Thedore Mins, [place], PA-C   9 months ago Type 2 diabetes mellitus with diabetic cataract, without long-term current use of insulin ([place])   [facility] Birdie Sons, MD   1 year ago Type 2 diabetes mellitus with diabetic cataract, without long-term current use of insulin [facility])   [place], [place], [place]   1 year ago Type 2 diabetes mellitus with diabetic cataract, without long-term current use of insulin [facility])   [place], [place]ce Sorrel, [place]       Future Appointments            In 1 week Mikey Kirschner, PA-C [employer], [place]   In 4 weeks Idolina Primer, Areta Haber, PA-C [facility], [facility]   In 2 months Mikey Kirschner, PA-C [employer], [place]   In 6 months Ralene Bathe, MD [place]

## 2021-11-16 NOTE — Discharge Summary (Signed)
Physician Discharge Summary   Patient: Joseph Cook MRN: [id number] DOB: [birth year]  Admit date:     11/14/2021  Discharge date: 11/16/21  Discharge Physician: Shawna Clamp   PCP: Mikey Kirschner, PA-C   Recommendations at discharge:  Advised to follow-up with primary care physician in 1 week.   Patient is being discharged with Zio patch monitoring. Advised to take lisinopril 20 mg and metoprolol 100 mg daily  Discharge Diagnoses: Principal Problem:   Near syncope Active Problems:   HTN (hypertension)   PVC (premature ventricular contraction)   Diabetes mellitus type II, controlled (HCC)   Systolic murmur   Lymphedema   Hypokalemia   SVT (supraventricular tachycardia) (HCC)  Resolved Problems:   Postural dizziness with presyncope   Hospital Course: This 81 year old man with past medical history of type 2 diabetes mellitus, glaucoma, hyperlipidemia, hypertension and PVCs.  The patient has a history of PVCs for a long time and is on metoprolol for this.  2 to 3 weeks ago he got dizzy and lightheaded and then sat down and it went away.  Yesterday it  happened again and was told to come into the hospital. In the hospital had an episode of fast heart rate,  dizziness and lightheadedness after walking back and forth to the bathroom.  Patient was admitted for presyncopal episode.  Patient was evaluated by cardiology,  Patient had echocardiogram which was normal.  LVEF normal.  D-dimer normal.  Patient was cleared from cardiology.  Patient is being discharged home with Zio patch monitoring.  Assessment and Plan: * Near syncope Patient with APCs seen on EKG.  Patient with PVCs on telemetry monitoring.  Had an episode of tachycardia,  looks like sinus tachycardia but could be SVT with walking back from the bathroom.  Increased Toprol-XL to 100 mg daily.  Echocardiogram unremarkable. cardiology consultation.  Patient cleared from cardiology on Zio patch monitor.  Hypokalemia Potassium  3.3 on presentation up to 3.6..  Hold hydrochlorothiazide.  Lymphedema Right leg : chronic lymphedema for 25 years  Systolic murmur- (present on admission) Patient with known systolic murmur since 7253 Echo shows LVEF 60 to 65%.  Diabetes mellitus type II, controlled ([place]) Continue metformin Sliding scale insulin coverage  PVC (premature ventricular contraction)- (present on admission) Continue metoprolol at increased dose.  HTN (hypertension) Continue Toprol and resume lisinopril.    Consultants: Cardiology Procedures performed: Echocardiogram Disposition: Home Diet recommendation:  Discharge Diet Orders (From admission, onward)     Start     Ordered   11/16/21 0000  Diet - low sodium heart healthy        11/16/21 1034   11/16/21 0000  Diet Carb Modified        11/16/21 1034           Carb modified diet  DISCHARGE MEDICATION: Allergies as of 11/16/2021   No Known Allergies      Medication List     STOP taking these medications    amoxicillin-clavulanate 875-125 MG tablet Commonly known as: AUGMENTIN   lisinopril-hydrochlorothiazide 20-25 MG tablet Commonly known as: ZESTORETIC       TAKE these medications    aspirin 325 MG tablet Take 325 mg by mouth daily.   atorvastatin 10 MG tablet Commonly known as: LIPITOR TAKE 1 TABLET BY MOUTH EVERYDAY AT BEDTIME   lisinopril 20 MG tablet Commonly known as: ZESTRIL Take 1 tablet (20 mg total) by mouth daily. Start taking on: November 17, 2021   metFORMIN 500 MG tablet Commonly known  as: GLUCOPHAGE Take 1 tablet (500 mg total) by mouth 2 (two) times daily with a meal.   metoprolol succinate 100 MG 24 hr tablet Commonly known as: TOPROL-XL Take 1 tablet (100 mg total) by mouth daily. Take with or immediately following a meal. Start taking on: November 17, 2021 What changed:  medication strength how much to take additional instructions   omeprazole 40 MG capsule Commonly known as:  PRILOSEC TAKE 1 CAPSULE BY MOUTH EVERY DAY   PRESERVISION AREDS 2 PO Take 1 capsule by mouth 2 (two) times daily.   Vitamin D3 50 MCG (2000 UT) Tabs Take 2,000 Units by mouth daily.        Follow-up Information     Mikey Kirschner, PA-C Follow up on 11/25/2021.   Specialty: Physician Assistant Why: @ 1:40pm Contact information: [address] [place] [address] [phone]         Nelva Bush, MD Follow up on 12/14/2021.   Specialty: Cardiology Why: @ 8:25am Patient will also be placed on the wait list. Contact information: [place] [address] [phone]                 Discharge Exam: Filed Weights   11/14/21 1542 11/15/21 1711 11/16/21 0409  Weight: 93.1 kg 91.6 kg 91.8 kg   General exam: Appears comfortable, not in any acute distress. Respiratory system: CTA bilaterally, no wheezing, no crackles. Cardiovascular system: S1-S2 heard, regular rate and rhythm, no murmur. Gastrointestinal system: Abdomen is soft, nontender, nondistended, BS+ Central nervous system: Alert and oriented x3, no neurological deficits Extremities: No edema, no cyanosis, no clubbing. Psychiatry: Mood judgment and insight normal   Condition at discharge: stable  The results of significant diagnostics from this hospitalization (including imaging, microbiology, ancillary and laboratory) are listed below for reference.   Imaging Studies: DG Chest 2 View  Result Date: 11/14/2021 CLINICAL DATA:  Palpitations EXAM: CHEST - 2 VIEW COMPARISON:  None. FINDINGS: The heart size and mediastinal contours are within normal limits. Probable mild scarring at the right lung base. No pleural effusion or pneumothorax. The visualized skeletal structures are unremarkable. IMPRESSION: No acute process in the chest. Electronically Signed   By: Macy Mis M.D.   On: 11/14/2021 16:33   ECHOCARDIOGRAM COMPLETE  Result Date: 11/15/2021     ECHOCARDIOGRAM REPORT   Patient Name:   Joseph Cook Date of Exam: 11/15/2021 Medical Rec #:  [id number]       Height:       72.0 in Accession #:    [id number]      Weight:       205.2 lb Date of Birth:  Feb 12, 1941      BSA:          2.154 m Patient Age:    1 years        BP:           136/82 mmHg Patient Gender: M               HR:           88 bpm. Exam Location:  [facility] Procedure: 2D Echo, Color Doppler and Cardiac Doppler Indications:     Syncope R55  History:         Patient has no prior history of Echocardiogram examinations.                  Risk Factors:Diabetes, Hypertension and Dyslipidemia. PVC.  Sonographer:  Sherrie Sport Referring Phys:  [id number] Athena Masse Diagnosing Phys: Nelva Bush MD  Sonographer Comments: Suboptimal apical window. IMPRESSIONS  1. Left ventricular ejection fraction, by estimation, is 65 to 70%. The left ventricle has normal function. Left ventricular endocardial border not optimally defined to evaluate regional wall motion. There is moderate left ventricular hypertrophy. Left ventricular diastolic parameters are consistent with Grade I diastolic dysfunction (impaired relaxation).  2. Right ventricular systolic function is normal. The right ventricular size is normal.  3. Left atrial size was mildly dilated.  4. Right atrial size was mildly dilated.  5. The mitral valve was not well visualized. No evidence of mitral valve regurgitation. No evidence of mitral stenosis.  6. The aortic valve was not well visualized, though it is suspicious for a congenitally malformed valve. There is moderate calcification of the aortic valve. There is moderate thickening of the aortic valve. Aortic valve regurgitation is not visualized.  The aortic valve is sclerotic without obvious stenosis, though suboptimal windows could lead to underestimation of aortic valve gradients. FINDINGS  Left Ventricle: Left ventricular ejection fraction, by estimation, is 65 to 70%. The left ventricle has normal  function. Left ventricular endocardial border not optimally defined to evaluate regional wall motion. The left ventricular internal cavity size was normal in size. There is moderate left ventricular hypertrophy. Left ventricular diastolic parameters are consistent with Grade I diastolic dysfunction (impaired relaxation). Right Ventricle: The right ventricular size is normal. No increase in right ventricular wall thickness. Right ventricular systolic function is normal. Left Atrium: Left atrial size was mildly dilated. Right Atrium: Right atrial size was mildly dilated. Pericardium: The pericardium was not well visualized. Mitral Valve: The mitral valve was not well visualized. No evidence of mitral valve regurgitation. No evidence of mitral valve stenosis. MV peak gradient, 3.8 mmHg. The mean mitral valve gradient is 1.0 mmHg. Tricuspid Valve: The tricuspid valve is not well visualized. Tricuspid valve regurgitation is trivial. 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 The aortic valve was not well visualized. There is moderate calcification of the aortic valve. There is moderate thickening of the aortic valve. Aortic valve regurgitation is not visualized. Aortic valve mean gradient measures 2.8 mmHg. Aortic valve peak gradient measures 4.8 mmHg. Aortic valve area, by VTI measures 5.67 cm. Pulmonic Valve: The pulmonic valve was not well visualized. Pulmonic valve regurgitation is trivial. No evidence of pulmonic stenosis. Aorta: The aortic root is normal in size and structure. Pulmonary Artery: The pulmonary artery is of normal size. IAS/Shunts: The interatrial septum was not well visualized.  LEFT VENTRICLE PLAX 2D LVIDd:         3.90 cm   Diastology LVIDs:         2.70 cm   LV e' medial:    4.90 cm/s LV PW:         1.30 cm   LV E/e' medial:  13.6 LV IVS:        1.60 cm   LV e' lateral:   5.98 cm/s LVOT diam:     2.00 cm   LV E/e' lateral: 11.1 LV SV:         94 LV SV Index:    44 LVOT Area:     3.14 cm  RIGHT VENTRICLE RV S prime:     18.20 cm/s TAPSE (M-mode): 3.0 cm LEFT ATRIUM             Index        RIGHT  ATRIUM           Index LA diam:        3.80 cm 1.76 cm/m   RA Area:     21.80 cm LA Vol (A2C):   58.0 ml 26.92 ml/m  RA Volume:   64.50 ml  29.94 ml/m LA Vol (A4C):   73.2 ml 33.98 ml/m LA Biplane Vol: 68.8 ml 31.94 ml/m  AORTIC VALVE                     PULMONIC VALVE AV Area (Vmax):    3.81 cm      PV Vmax:        1.02 m/s AV Area (Vmean):   4.71 cm      PV Vmean:       70.500 cm/s AV Area (VTI):     5.67 cm      PV VTI:         0.154 m AV Vmax:           109.80 cm/s   PV Peak grad:   4.2 mmHg AV Vmean:          73.425 cm/s   PV Mean grad:   2.0 mmHg AV VTI:            0.166 m       RVOT Peak grad: 4 mmHg AV Peak Grad:      4.8 mmHg AV Mean Grad:      2.8 mmHg LVOT Vmax:         133.00 cm/s LVOT Vmean:        110.000 cm/s LVOT VTI:          0.300 m LVOT/AV VTI ratio: 1.80  AORTA Ao Root diam: 3.37 cm MITRAL VALVE                TRICUSPID VALVE MV Area (PHT): 2.23 cm     TR Peak grad:   8.5 mmHg MV Area VTI:   4.28 cm     TR Vmax:        146.00 cm/s MV Peak grad:  3.8 mmHg MV Mean grad:  1.0 mmHg     SHUNTS MV Vmax:       0.98 m/s     Systemic VTI:  0.30 m MV Vmean:      54.0 cm/s    Systemic Diam: 2.00 cm MV Decel Time: 340 msec     Pulmonic VTI:  0.159 m MV E velocity: 66.40 cm/s MV A velocity: 117.00 cm/s MV E/A ratio:  0.57 Har[place] Gave End MD Electronically signed by Nelva Bush MD Signature Date/Time: 11/15/2021/12:34:25 PM    Final     Microbiology: Results for orders placed or performed during the hospital encounter of 11/14/21  Resp Panel by RT-PCR (Flu A&B, Covid) Nasopharyngeal Swab     Status: None   Collection Time: 11/14/21  9:55 PM   Specimen: Nasopharyngeal Swab; Nasopharyngeal(NP) swabs in vial transport medium  Result Value Ref Range Status   SARS Coronavirus 2 by RT PCR NEGATIVE NEGATIVE Final    Comment: (NOTE) SARS-CoV-2 target nucleic  acids are NOT DETECTED.  The SARS-CoV-2 RNA is generally detectable in upper respiratory specimens during the acute phase of infection. The lowest concentration of SARS-CoV-2 viral copies this assay can detect is 138 copies/mL. A negative result does not preclude SARS-Cov-2 infection and should not be used as the sole basis for treatment or other patient management decisions. A negative result  may occur with  improper specimen collection/handling, submission of specimen other than nasopharyngeal swab, presence of viral mutation(s) within the areas targeted by this assay, and inadequate number of viral copies(<138 copies/mL). A negative result must be combined with clinical observations, patient history, and epidemiological information. The expected result is Negative.  Fact Sheet for Patients:  EntrepreneurPulse.com.au  Fact Sheet for Healthcare Providers:  IncredibleEmployment.be  This test is no t yet approved or cleared by the [place] FDA and  has been authorized for detection and/or diagnosis of SARS-CoV-2 by FDA under an Emergency Use Authorization (EUA). This EUA will remain  in effect (meaning this test can be used) for the duration of the COVID-19 declaration under Section 564(b)(1) of the Act, 21 U.S.C.section 360bbb-3(b)(1), unless the authorization is terminated  or revoked sooner.       Influenza A by PCR NEGATIVE NEGATIVE Final   Influenza B by PCR NEGATIVE NEGATIVE Final    Comment: (NOTE) The Xpert Xpress SARS-CoV-2/FLU/RSV plus assay is intended as an aid in the diagnosis of influenza from Nasopharyngeal swab specimens and should not be used as a sole basis for treatment. Nasal washings and aspirates are unacceptable for Xpert Xpress SARS-CoV-2/FLU/RSV testing.  Fact Sheet for Patients: EntrepreneurPulse.com.au  Fact Sheet for Healthcare Providers: IncredibleEmployment.be  This  test is not yet approved or cleared by the [place] FDA and has been authorized for detection and/or diagnosis of SARS-CoV-2 by FDA under an Emergency Use Authorization (EUA). This EUA will remain in effect (meaning this test can be used) for the duration of the COVID-19 declaration under Section 564(b)(1) of the Act, 21 U.S.C. section 360bbb-3(b)(1), unless the authorization is terminated or revoked.  Performed at [facility], [address] [place], [place] [address]     Labs: CBC: Recent Labs  Lab 11/14/21 1543 11/15/21 0533  WBC 7.0 7.0  HGB 16.0 14.6  HCT 46.5 41.6  MCV 85.2 85.4  PLT 160 540*   Basic Metabolic Panel: Recent Labs  Lab 11/14/21 1543 11/14/21 2259 11/15/21 0533 11/15/21 0604 11/16/21 0514  NA 135  --  138  --  137  K 3.3*  --  3.6  --  3.4*  CL 104  --  105  --  104  CO2 19*  --  22  --  25  GLUCOSE 180*  --  159*  --  150*  BUN 24*  --  24*  --  23  CREATININE 0.95 0.71 0.75  --  0.67  CALCIUM 8.6*  --  8.5*  --  8.4*  MG  --   --   --  2.0 1.7   Liver Function Tests: No results for input(s): AST, ALT, ALKPHOS, BILITOT, PROT, ALBUMIN in the last 168 hours. CBG: Recent Labs  Lab 11/15/21 0803 11/15/21 1129 11/15/21 1746 11/16/21 0629 11/16/21 0752  GLUCAP 180* 177* 147* 131* 122*    Discharge time spent: greater than 30 minutes.  Signed: Shawna Clamp, MD [facility] 11/16/2021

## 2021-11-17 ENCOUNTER — Telehealth: Payer: Self-pay

## 2021-11-17 NOTE — Telephone Encounter (Signed)
Transition Care Management Follow-up Telephone Call Date of discharge and from where: TCM [facility] 11-16-21 Dx: near syncope How have you been since you were released from the hospital? Doing really well  Any questions or concerns? No  Items Reviewed: Did the pt receive and understand the discharge instructions provided? Yes  Medications obtained and verified? Yes  Other? No  Any new allergies since your discharge? No  Dietary orders reviewed? Yes Do you have support at home? Yes   Home Care and Equipment/Supplies: Were home health services ordered? no If so, what is the name of the agency? na  Has the agency set up a time to come to the patient's home? not applicable Were any new equipment or medical supplies ordered?  No What is the name of the medical supply agency? na Were you able to get the supplies/equipment? not applicable Do you have any questions related to the use of the equipment or supplies? No  Functional Questionnaire: (I = Independent and D = Dependent) ADLs: I  Bathing/Dressing- I  Meal Prep- I  Eating- I  Maintaining continence- I  Transferring/Ambulation- I  Managing Meds- I  Follow up appointments reviewed:  PCP Hospital f/u appt confirmed? Yes  Scheduled to see Dr Thedore Mins  on 11-25-21  @ 140pm. [facility] f/u appt confirmed? Yes  Scheduled to see Dr End  on 12-26-21 @ unsure of time . Are transportation arrangements needed? No  If their condition worsens, is the pt aware to call PCP or go to the Emergency Dept.? Yes Was the patient provided with contact information for the PCP's office or ED? Yes Was to pt encouraged to call back with questions or concerns? Yes

## 2021-11-24 NOTE — Progress Notes (Signed)
? ?I,Sha'taria Tyson,acting as a Education administrator for [employer], PA-C.,have documented all relevant documentation on the behalf of Mikey Kirschner, PA-C,as directed by  Mikey Kirschner, PA-C while in the presence of Mikey Kirschner, PA-C. ? ?Established Patient Office Visit ? ?Subjective:  ?Patient ID: Joseph Cook, male    DOB: 1941-08-21  Age: 81 y.o. MRN: [id number] ? ?CC: hospital follow up ? ?HPI ?Joseph Cook presents for hospital follow-up. ?Follow up Hospitalization ? ?Patient was admitted to [facility] on 11/14/21 and discharged on 11/16/21. ?He was treated for a presyncopal episode with history of cardiac arrhythmias.  ?Treatment for this included patient being discharged with Zio patch monitoring. Started the Zio patch on 11/16/2021 ?Advised to take lisinopril 20 mg and increase metoprolol 100 mg daily. Was found to be hypokalemic and HCTZ was d/c. K was 3.4 at d/c. ?Telephone follow up was done on 11/17/21 ?First day he was home, didn't take the metoprolol in the AM, started to feel palpitations towards the end of the day. Took a dose at night and feeling resolved. ?Throughout the last week, a few episodes of what he calls 'pvcs' nothing sustained, but has been recording with the [place]. Denies dizziness or pre-syncope.  ? ?----------------------------------------------------------------------------------------- - ? ? ?Past Medical History:  ?Diagnosis Date  ? Actinic keratosis   ? Basal cell carcinoma   ? Cataract   ? Diabetes mellitus   ? Dysplastic nevus 08/13/2009  ? right medial lower leg above medial ankle, malleolus -severe  ? Glaucoma   ? Hyperlipidemia   ? Hypertension   ? PVC (premature ventricular contraction)   ? ? ?Past Surgical History:  ?Procedure Laterality Date  ? HERNIA REPAIR    ? ROTATOR CUFF REPAIR    ? ? ?Family History  ?Problem Relation Age of Onset  ? Stroke Mother   ? Heart attack Father   ? Alzheimer's disease Maternal Aunt   ? Heart attack Maternal Uncle   ? Cancer Maternal Grandmother    ? Cancer Paternal Grandmother   ? ? ?Social History  ? ?Socioeconomic History  ? Marital status: Married  ?  Spouse name: Not on file  ? Number of children: 5  ? Years of education: Not on file  ? Highest education level: Some college, no degree  ?Occupational History  ? Occupation: retired  ?Tobacco Use  ? Smoking status: Never  ? Smokeless tobacco: Former  ?Substance and Sexual Activity  ? Alcohol use: Not Currently  ? Drug use: No  ? Sexual activity: Not on file  ?Other Topics Concern  ? Not on file  ?Social History Narrative  ? Not on file  ? ?Social Determinants of Health  ? ?Financial Resource Strain: Not on file  ?Food Insecurity: Not on file  ?Transportation Needs: Not on file  ?Physical Activity: Not on file  ?Stress: Not on file  ?Social Connections: Not on file  ?Intimate Partner Violence: Not on file  ? ? ?Outpatient Medications Prior to Visit  ?Medication Sig Dispense Refill  ? aspirin 325 MG tablet Take 325 mg by mouth daily.    ? atorvastatin (LIPITOR) 10 MG tablet TAKE 1 TABLET BY MOUTH EVERYDAY AT BEDTIME 90 tablet 1  ? Cholecalciferol (VITAMIN D3) 50 MCG (2000 UT) TABS Take 2,000 Units by mouth daily.     ? lisinopril (ZESTRIL) 20 MG tablet Take 1 tablet (20 mg total) by mouth daily. 30 tablet 1  ? metFORMIN (GLUCOPHAGE) 500 MG tablet Take 1 tablet (500 mg total) by mouth 2 (two)  times daily with a meal. 180 tablet 2  ? metoprolol succinate (TOPROL-XL) 100 MG 24 hr tablet Take 1 tablet (100 mg total) by mouth daily. Take with or immediately following a meal. 30 tablet 1  ? Multiple Vitamins-Minerals (PRESERVISION AREDS 2 PO) Take 1 capsule by mouth 2 (two) times daily.     ? omeprazole (PRILOSEC) 40 MG capsule TAKE 1 CAPSULE BY MOUTH EVERY DAY 90 capsule 1  ? tamsulosin (FLOMAX) 0.4 MG CAPS capsule TAKE 1 CAPSULE BY MOUTH EVERY DAY 90 capsule 0  ? ?No facility-administered medications prior to visit.  ? ? ?No Known Allergies ? ?ROS ?Review of Systems  ?Constitutional:  Negative for fatigue and  fever.  ?Respiratory:  Negative for cough and shortness of breath.   ?Cardiovascular:  Positive for palpitations. Negative for chest pain and leg swelling.  ?Neurological:  Negative for dizziness and headaches.  ? ?  ?Objective:  ?  ?Physical Exam ?Constitutional:   ?   Appearance: Normal appearance. He is not ill-appearing.  ?HENT:  ?   Head: Normocephalic.  ?Eyes:  ?   Conjunctiva/sclera: Conjunctivae normal.  ?Cardiovascular:  ?   Rate and Rhythm: Normal rate and regular rhythm.  ?   Pulses: Normal pulses.  ?   Heart sounds: Murmur heard.  ?Pulmonary:  ?   Effort: Pulmonary effort is normal.  ?   Breath sounds: Normal breath sounds.  ?Neurological:  ?   Mental Status: He is oriented to person, place, and time.  ?Psychiatric:     ?   Mood and Affect: Mood normal.     ?   Behavior: Behavior normal.  ? ? ?BP (!) 160/86   Pulse (!) 52   Temp 97.7 ?F (36.5 ?C)   Resp 12   Ht '5\' 10"'  (1.778 m)   Wt 203 lb (92.1 kg)   SpO2 96%   BMI 29.13 kg/m?  ?Wt Readings from Last 3 Encounters:  ?11/25/21 203 lb (92.1 kg)  ?11/16/21 202 lb 4.8 oz (91.8 kg)  ?10/06/21 205 lb 3.2 oz (93.1 kg)  ? ? ? ?Health Maintenance Due  ?Topic Date Due  ? Zoster Vaccines- Shingrix (1 of 2) Never done  ? Pneumonia Vaccine 45+ Years old (1 - PCV) Never done  ? COVID-19 Vaccine (3 - Pfizer risk series) 12/03/2019  ? OPHTHALMOLOGY EXAM  08/12/2020  ? ? ?There are no preventive care reminders to display for this patient. ? ?Lab Results  ?Component Value Date  ? TSH 3.723 11/14/2021  ? ?Lab Results  ?Component Value Date  ? WBC 7.0 11/15/2021  ? HGB 14.6 11/15/2021  ? HCT 41.6 11/15/2021  ? MCV 85.4 11/15/2021  ? PLT 148 (L) 11/15/2021  ? ?Lab Results  ?Component Value Date  ? NA 137 11/16/2021  ? K 3.4 (L) 11/16/2021  ? CO2 25 11/16/2021  ? GLUCOSE 150 (H) 11/16/2021  ? BUN 23 11/16/2021  ? CREATININE 0.67 11/16/2021  ? BILITOT 0.7 08/12/2021  ? ALKPHOS 47 08/12/2021  ? AST 15 08/12/2021  ? ALT 21 08/12/2021  ? PROT 6.7 08/12/2021  ? ALBUMIN 4.8  (H) 08/12/2021  ? CALCIUM 8.4 (L) 11/16/2021  ? ANIONGAP 8 11/16/2021  ? EGFR 88 08/12/2021  ? ?Lab Results  ?Component Value Date  ? CHOL 124 08/12/2021  ? ?Lab Results  ?Component Value Date  ? HDL 37 (L) 08/12/2021  ? ?Lab Results  ?Component Value Date  ? [place] 60 08/12/2021  ? ?Lab Results  ?Component Value Date  ?  TRIG 158 (H) 08/12/2021  ? ?Lab Results  ?Component Value Date  ? CHOLHDL 3.4 08/12/2021  ? ?Lab Results  ?Component Value Date  ? HGBA1C 8.7 (H) 11/14/2021  ? ? ?  ?Assessment & Plan:  ? ?Problem List Items Addressed This Visit   ? ?  ? Cardiovascular and Mediastinum  ? HTN (hypertension)  ?  Elevated at visit, pt has f/u with cardiologist scheduled for medication management ?Will monitor ?  ?  ? SVT (supraventricular tachycardia) ([place]) - Primary  ?  In hospital saw APCs, PVCs, one potential SVT episode. ?Pt has upcoming appt w/ cardio. ?Still wearing Zio monitor for another week. ?Is compliant with new changes in meds. (linsinopril 20 metoprolol 100) ?Denies any significant events or dizziness.  ?  ?  ?  ? Nervous and Auditory  ? Impacted cerumen  ?  Historically, at end of visit pt requested he be seen by an ENT as we have previously flushed his ears without success.  ?  ?  ? Relevant Orders  ? Ambulatory referral to ENT  ?  ? Other  ? Hypokalemia  ?  Will recheck K before cardio visit ?  ?  ? Relevant Orders  ? Basic Metabolic Panel (BMET)  ? ? ?F/u as scheduled ? ?I, Mikey Kirschner, PA-C have reviewed all documentation for this visit. The documentation on  11/25/2021 for the exam, diagnosis, procedures, and orders are all accurate and complete. ? ?Mikey Kirschner, PA-C ?[place] ?[id number] ?[place], [place], [address] ?Office: [phone] ?Fax: [phone]  ?

## 2021-11-25 ENCOUNTER — Ambulatory Visit (INDEPENDENT_AMBULATORY_CARE_PROVIDER_SITE_OTHER): Payer: Medicare Other | Admitting: Physician Assistant

## 2021-11-25 ENCOUNTER — Other Ambulatory Visit: Payer: Self-pay

## 2021-11-25 ENCOUNTER — Encounter: Payer: Self-pay | Admitting: Physician Assistant

## 2021-11-25 VITALS — BP 160/86 | HR 52 | Temp 97.7°F | Resp 12 | Ht 70.0 in | Wt 203.0 lb

## 2021-11-25 DIAGNOSIS — E876 Hypokalemia: Secondary | ICD-10-CM

## 2021-11-25 DIAGNOSIS — I471 Supraventricular tachycardia, unspecified: Secondary | ICD-10-CM

## 2021-11-25 DIAGNOSIS — H6120 Impacted cerumen, unspecified ear: Secondary | ICD-10-CM | POA: Diagnosis not present

## 2021-11-25 DIAGNOSIS — I10 Essential (primary) hypertension: Secondary | ICD-10-CM | POA: Diagnosis not present

## 2021-11-25 NOTE — Assessment & Plan Note (Addendum)
In hospital saw APCs, PVCs, one potential SVT episode. ?Pt has upcoming appt w/ cardio. ?Still wearing Zio monitor for another week. ?Is compliant with new changes in meds. (linsinopril 20 metoprolol 100) ?Denies any significant events or dizziness.  ?

## 2021-11-25 NOTE — Assessment & Plan Note (Signed)
Historically, at end of visit pt requested he be seen by an ENT as we have previously flushed his ears without success.  ?

## 2021-11-25 NOTE — Assessment & Plan Note (Signed)
Will recheck K before cardio visit ?

## 2021-11-25 NOTE — Assessment & Plan Note (Signed)
Elevated at visit, pt has f/u with cardiologist scheduled for medication management ?Will monitor ?

## 2021-11-26 LAB — BASIC METABOLIC PANEL
BUN/Creatinine Ratio: 21 (ref 10–24)
BUN: 15 mg/dL (ref 8–27)
CO2: 22 mmol/L (ref 20–29)
Calcium: 9.1 mg/dL (ref 8.6–10.2)
Chloride: 107 mmol/L — ABNORMAL HIGH (ref 96–106)
Creatinine, Ser: 0.73 mg/dL — ABNORMAL LOW (ref 0.76–1.27)
Glucose: 116 mg/dL — ABNORMAL HIGH (ref 70–99)
Potassium: 4.1 mmol/L (ref 3.5–5.2)
Sodium: 142 mmol/L (ref 134–144)
eGFR: 92 mL/min/{1.73_m2} (ref >59)

## 2021-12-05 ENCOUNTER — Other Ambulatory Visit: Payer: Self-pay | Admitting: Physician Assistant

## 2021-12-05 ENCOUNTER — Telehealth: Payer: Self-pay

## 2021-12-05 DIAGNOSIS — E119 Type 2 diabetes mellitus without complications: Secondary | ICD-10-CM

## 2021-12-05 MED ORDER — BLOOD GLUCOSE MONITOR KIT: PACK | 0 refills | Status: DC

## 2021-12-05 NOTE — Telephone Encounter (Signed)
Copied from [place] [id number]. Topic: General - Other ?>> Dec 05, 2021  1:14 PM Tessa Lerner A wrote: ?Reason for CRM: The patient has called to request a prescription for a glucose monitor and test strips  ? ?The patient shares that their current meter is no longer working ? ?The patient would like a meter with no sound  ? ?The patient has also called to request test strips and lancets  ? ?[id number] [id number]?Phone: [phone]: [phone]?Hours: Not open 24 hours ? ?Please contact further when possible ?

## 2021-12-14 ENCOUNTER — Ambulatory Visit: Payer: Medicare Other | Admitting: Physician Assistant

## 2021-12-23 NOTE — H&P (View-Only) (Signed)
? ?Cardiology Office Note   ? ?Date:  12/26/2021  ? ?IDAndrick Cook, DOB 1941/09/17, MRN [id number] ? ?PCP:  Mikey Kirschner, PA-C  ?Cardiologist:  Nelva Bush, MD  ?Electrophysiologist:  None  ? ?Chief Complaint: Hospital follow-up ? ?History of Present Illness:  ? ?Joseph Cook is a 81 y.o. male with history of palpitations with PVCs dating back to the 1970s, paroxysmal SVT diagnosed in 10/2021, cardiac murmur, DM2, HTN, and HLD who presents for hospital follow up as outlined below.  ? ?He reports a long history of PVCs that were initially diagnosed in the 1970s, as well as a history of a cardiac murmur.  More recently, he reports an episode of dizziness with near syncope while playing golf back in the summer of 2022 that improved with resting.  He did not seek medical care at that time.  He was admitted to [facility] from 11/14/2021 through 11/16/2021 with increased palpitations with near syncope after having recently had a GI illness. High sensitivity troponin negative x 2. EKG showed sinus rhythm with PACs, left axis deviation, and poor R wave progression.  He was hypokalemic upon presentation at 3.3. TSH normal. D-dimer negative. Telemetry showed episodes of atrial tach/SVT.  Echo demonstrated an EF of 65 to 70%, moderate LVH, grade 1 diastolic dysfunction, normal RV systolic function and ventricular cavity size, mild biatrial enlargement, poorly visualized aortic valve, though this was suspicious for congenitally malformed valve with moderate calcification and thickening without evidence of obvious stenosis.  While admitted, metoprolol was titrated to 100 mg daily.  With this, he had no further episodes of palpitations or significant arrhythmias on telemetry.  HCTZ was held at time of discharge. ? ?Outpatient cardiac monitoring showed a predominant rhythm of sinus with an average rate of 59 bpm (range 45 to 111 bpm in sinus), single episode of NSVT lasting 5 beats with a maximum rate of 158 bpm, 77 atrial  runs lasting up to 4 minutes 11 seconds with a maximum rate of 176 bpm, occasional PACs, rare PVCs, and no prolonged pauses.  Patient triggered events corresponded to sinus rhythm, PACs, PVCs, and SVT.  Heart rate precluded further escalation of medical therapy at that time. ? ?He comes in doing well from a cardiac perspective.  He is without symptoms of angina, dyspnea, dizziness, presyncope, or syncope.  He has stable mild lower extremity swelling which has been attributed to lymphedema.  He does continue to have tachypalpitations occurring multiple times per day, though these do not feel as strong as what he experienced leading to his hospital admission.  They will typically last several seconds up to 3 to 4 minutes occasionally.  He is tolerating titrated dose of metoprolol.   ? ? ?Labs independently reviewed: ?11/2021 - BUN 15, SCr0.73, potassium 4.1,  ?10/2021  - magnesium 1.7, HGB 14.6, PLT 148, TSH normal, A1c 8.7 ?07/2021 - albumin 4.8, AST/ALT normal, TC 124, TG 158, HDL 37, LDL 60 ? ? ?Past Medical History:  ?Diagnosis Date  ? Actinic keratosis   ? Basal cell carcinoma   ? Cataract   ? Diabetes mellitus   ? Dysplastic nevus 08/13/2009  ? right medial lower leg above medial ankle, malleolus -severe  ? Glaucoma   ? Hyperlipidemia   ? Hypertension   ? PVC (premature ventricular contraction)   ? ? ?Past Surgical History:  ?Procedure Laterality Date  ? HERNIA REPAIR    ? ROTATOR CUFF REPAIR    ? ? ?Current Medications: ?Current Meds  ?Medication  Sig  ? aspirin 325 MG tablet Take 325 mg by mouth daily.  ? atorvastatin (LIPITOR) 10 MG tablet TAKE 1 TABLET BY MOUTH EVERYDAY AT BEDTIME  ? blood glucose meter kit and supplies KIT Dispense based on patient and insurance preference. Use up to four times daily as directed.  ? Cholecalciferol (VITAMIN D3) 50 MCG (2000 UT) TABS Take 2,000 Units by mouth daily.   ? lisinopril (ZESTRIL) 20 MG tablet Take 1 tablet (20 mg total) by mouth daily.  ? metFORMIN (GLUCOPHAGE) 500  MG tablet Take 1 tablet (500 mg total) by mouth 2 (two) times daily with a meal.  ? metoprolol succinate (TOPROL-XL) 100 MG 24 hr tablet Take 1 tablet (100 mg total) by mouth daily. Take with or immediately following a meal.  ? Multiple Vitamins-Minerals (PRESERVISION AREDS 2 PO) Take 1 capsule by mouth 2 (two) times daily.   ? omeprazole (PRILOSEC) 40 MG capsule TAKE 1 CAPSULE BY MOUTH EVERY DAY  ? tamsulosin (FLOMAX) 0.4 MG CAPS capsule TAKE 1 CAPSULE BY MOUTH EVERY DAY  ? ? ?Allergies:   Patient has no known allergies.  ? ?Social History  ? ?Socioeconomic History  ? Marital status: Married  ?  Spouse name: Not on file  ? Number of children: 5  ? Years of education: Not on file  ? Highest education level: Some college, no degree  ?Occupational History  ? Occupation: retired  ?Tobacco Use  ? Smoking status: Never  ? Smokeless tobacco: Former  ?Vaping Use  ? Vaping Use: Never used  ?Substance and Sexual Activity  ? Alcohol use: Not Currently  ? Drug use: No  ? Sexual activity: Not on file  ?Other Topics Concern  ? Not on file  ?Social History Narrative  ? Not on file  ? ?Social Determinants of Health  ? ?Financial Resource Strain: Not on file  ?Food Insecurity: Not on file  ?Transportation Needs: Not on file  ?Physical Activity: Not on file  ?Stress: Not on file  ?Social Connections: Not on file  ?  ? ?Family History:  ?The patient's family history includes Alzheimer's disease in his maternal aunt; Cancer in his maternal grandmother and paternal grandmother; Heart attack in his father and maternal uncle; Stroke in his mother. ? ?ROS:   ?Review of Systems  ?Constitutional:  Negative for chills, diaphoresis, fever, malaise/fatigue and weight loss.  ?HENT:  Negative for congestion.   ?Eyes:  Negative for discharge and redness.  ?Respiratory:  Negative for cough, sputum production, shortness of breath and wheezing.   ?Cardiovascular:  Positive for palpitations. Negative for chest pain, orthopnea, claudication, leg  swelling and PND.  ?Gastrointestinal:  Negative for abdominal pain, heartburn, nausea and vomiting.  ?Musculoskeletal:  Negative for falls and myalgias.  ?Skin:  Negative for rash.  ?Neurological:  Negative for dizziness, tingling, tremors, sensory change, speech change, focal weakness, loss of consciousness and weakness.  ?Endo/Heme/Allergies:  Does not bruise/bleed easily.  ?Psychiatric/Behavioral:  Negative for substance abuse. The patient is not nervous/anxious.   ?All other systems reviewed and are negative. ? ? ?EKGs/Labs/Other Studies Reviewed:   ? ?Studies reviewed were summarized above. The additional studies were reviewed today: ? ?Zio patch 10/2021: ?The patient was monitored for 14 days. ?The predominant rhythm was sinus with an average rate of 59 bpm (range 45-111 bpm in sinus). ?There were occasional PAC's and rare PVC's. ?A single episode of nonsustained ventricular tachycardia was observed, lasting 5 beats with a maximum rate of 158 bpm. ?There were 77  atrial runs lasting up to 4 minutes, 11 seconds, with a maximum rate of 176 bpm. ?No prolonged pause occurred. ?Patient triggered events correspond to sinus rhythm, PAC's, PVC's, and SVT. ?  ?Predominantly sinus rhythm with occasional PAC's, rare PVC's, and multiple runs of PSVT.  A single brief episode of NSVT was also noted. ?__________ ? ?2D echo 11/15/2021: ?1. Left ventricular ejection fraction, by estimation, is 65 to 70%. The  ?left ventricle has normal function. Left ventricular endocardial border  ?not optimally defined to evaluate regional wall motion. There is moderate  ?left ventricular hypertrophy. Left  ?ventricular diastolic parameters are consistent with Grade I diastolic  ?dysfunction (impaired relaxation).  ? 2. Right ventricular systolic function is normal. The right ventricular  ?size is normal.  ? 3. Left atrial size was mildly dilated.  ? 4. Right atrial size was mildly dilated.  ? 5. The mitral valve was not well visualized. No  evidence of mitral valve  ?regurgitation. No evidence of mitral stenosis.  ? 6. 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 There is moderate calcific

## 2021-12-23 NOTE — Progress Notes (Signed)
? ?Cardiology Office Note   ? ?Date:  12/26/2021  ? ?IDAndrick Cook, DOB 1941/09/17, MRN [id number] ? ?PCP:  Mikey Kirschner, PA-C  ?Cardiologist:  Nelva Bush, MD  ?Electrophysiologist:  None  ? ?Chief Complaint: Hospital follow-up ? ?History of Present Illness:  ? ?Joseph Cook is a 81 y.o. male with history of palpitations with PVCs dating back to the 1970s, paroxysmal SVT diagnosed in 10/2021, cardiac murmur, DM2, HTN, and HLD who presents for hospital follow up as outlined below.  ? ?He reports a long history of PVCs that were initially diagnosed in the 1970s, as well as a history of a cardiac murmur.  More recently, he reports an episode of dizziness with near syncope while playing golf back in the summer of 2022 that improved with resting.  He did not seek medical care at that time.  He was admitted to [facility] from 11/14/2021 through 11/16/2021 with increased palpitations with near syncope after having recently had a GI illness. High sensitivity troponin negative x 2. EKG showed sinus rhythm with PACs, left axis deviation, and poor R wave progression.  He was hypokalemic upon presentation at 3.3. TSH normal. D-dimer negative. Telemetry showed episodes of atrial tach/SVT.  Echo demonstrated an EF of 65 to 70%, moderate LVH, grade 1 diastolic dysfunction, normal RV systolic function and ventricular cavity size, mild biatrial enlargement, poorly visualized aortic valve, though this was suspicious for congenitally malformed valve with moderate calcification and thickening without evidence of obvious stenosis.  While admitted, metoprolol was titrated to 100 mg daily.  With this, he had no further episodes of palpitations or significant arrhythmias on telemetry.  HCTZ was held at time of discharge. ? ?Outpatient cardiac monitoring showed a predominant rhythm of sinus with an average rate of 59 bpm (range 45 to 111 bpm in sinus), single episode of NSVT lasting 5 beats with a maximum rate of 158 bpm, 77 atrial  runs lasting up to 4 minutes 11 seconds with a maximum rate of 176 bpm, occasional PACs, rare PVCs, and no prolonged pauses.  Patient triggered events corresponded to sinus rhythm, PACs, PVCs, and SVT.  Heart rate precluded further escalation of medical therapy at that time. ? ?He comes in doing well from a cardiac perspective.  He is without symptoms of angina, dyspnea, dizziness, presyncope, or syncope.  He has stable mild lower extremity swelling which has been attributed to lymphedema.  He does continue to have tachypalpitations occurring multiple times per day, though these do not feel as strong as what he experienced leading to his hospital admission.  They will typically last several seconds up to 3 to 4 minutes occasionally.  He is tolerating titrated dose of metoprolol.   ? ? ?Labs independently reviewed: ?11/2021 - BUN 15, SCr0.73, potassium 4.1,  ?10/2021  - magnesium 1.7, HGB 14.6, PLT 148, TSH normal, A1c 8.7 ?07/2021 - albumin 4.8, AST/ALT normal, TC 124, TG 158, HDL 37, LDL 60 ? ? ?Past Medical History:  ?Diagnosis Date  ? Actinic keratosis   ? Basal cell carcinoma   ? Cataract   ? Diabetes mellitus   ? Dysplastic nevus 08/13/2009  ? right medial lower leg above medial ankle, malleolus -severe  ? Glaucoma   ? Hyperlipidemia   ? Hypertension   ? PVC (premature ventricular contraction)   ? ? ?Past Surgical History:  ?Procedure Laterality Date  ? HERNIA REPAIR    ? ROTATOR CUFF REPAIR    ? ? ?Current Medications: ?Current Meds  ?Medication  Sig  ? aspirin 325 MG tablet Take 325 mg by mouth daily.  ? atorvastatin (LIPITOR) 10 MG tablet TAKE 1 TABLET BY MOUTH EVERYDAY AT BEDTIME  ? blood glucose meter kit and supplies KIT Dispense based on patient and insurance preference. Use up to four times daily as directed.  ? Cholecalciferol (VITAMIN D3) 50 MCG (2000 UT) TABS Take 2,000 Units by mouth daily.   ? lisinopril (ZESTRIL) 20 MG tablet Take 1 tablet (20 mg total) by mouth daily.  ? metFORMIN (GLUCOPHAGE) 500  MG tablet Take 1 tablet (500 mg total) by mouth 2 (two) times daily with a meal.  ? metoprolol succinate (TOPROL-XL) 100 MG 24 hr tablet Take 1 tablet (100 mg total) by mouth daily. Take with or immediately following a meal.  ? Multiple Vitamins-Minerals (PRESERVISION AREDS 2 PO) Take 1 capsule by mouth 2 (two) times daily.   ? omeprazole (PRILOSEC) 40 MG capsule TAKE 1 CAPSULE BY MOUTH EVERY DAY  ? tamsulosin (FLOMAX) 0.4 MG CAPS capsule TAKE 1 CAPSULE BY MOUTH EVERY DAY  ? ? ?Allergies:   Patient has no known allergies.  ? ?Social History  ? ?Socioeconomic History  ? Marital status: Married  ?  Spouse name: Not on file  ? Number of children: 5  ? Years of education: Not on file  ? Highest education level: Some college, no degree  ?Occupational History  ? Occupation: retired  ?Tobacco Use  ? Smoking status: Never  ? Smokeless tobacco: Former  ?Vaping Use  ? Vaping Use: Never used  ?Substance and Sexual Activity  ? Alcohol use: Not Currently  ? Drug use: No  ? Sexual activity: Not on file  ?Other Topics Concern  ? Not on file  ?Social History Narrative  ? Not on file  ? ?Social Determinants of Health  ? ?Financial Resource Strain: Not on file  ?Food Insecurity: Not on file  ?Transportation Needs: Not on file  ?Physical Activity: Not on file  ?Stress: Not on file  ?Social Connections: Not on file  ?  ? ?Family History:  ?The patient's family history includes Alzheimer's disease in his maternal aunt; Cancer in his maternal grandmother and paternal grandmother; Heart attack in his father and maternal uncle; Stroke in his mother. ? ?ROS:   ?Review of Systems  ?Constitutional:  Negative for chills, diaphoresis, fever, malaise/fatigue and weight loss.  ?HENT:  Negative for congestion.   ?Eyes:  Negative for discharge and redness.  ?Respiratory:  Negative for cough, sputum production, shortness of breath and wheezing.   ?Cardiovascular:  Positive for palpitations. Negative for chest pain, orthopnea, claudication, leg  swelling and PND.  ?Gastrointestinal:  Negative for abdominal pain, heartburn, nausea and vomiting.  ?Musculoskeletal:  Negative for falls and myalgias.  ?Skin:  Negative for rash.  ?Neurological:  Negative for dizziness, tingling, tremors, sensory change, speech change, focal weakness, loss of consciousness and weakness.  ?Endo/Heme/Allergies:  Does not bruise/bleed easily.  ?Psychiatric/Behavioral:  Negative for substance abuse. The patient is not nervous/anxious.   ?All other systems reviewed and are negative. ? ? ?EKGs/Labs/Other Studies Reviewed:   ? ?Studies reviewed were summarized above. The additional studies were reviewed today: ? ?Zio patch 10/2021: ?The patient was monitored for 14 days. ?The predominant rhythm was sinus with an average rate of 59 bpm (range 45-111 bpm in sinus). ?There were occasional PAC's and rare PVC's. ?A single episode of nonsustained ventricular tachycardia was observed, lasting 5 beats with a maximum rate of 158 bpm. ?There were 77  atrial runs lasting up to 4 minutes, 11 seconds, with a maximum rate of 176 bpm. ?No prolonged pause occurred. ?Patient triggered events correspond to sinus rhythm, PAC's, PVC's, and SVT. ?  ?Predominantly sinus rhythm with occasional PAC's, rare PVC's, and multiple runs of PSVT.  A single brief episode of NSVT was also noted. ?__________ ? ?2D echo 11/15/2021: ?1. Left ventricular ejection fraction, by estimation, is 65 to 70%. The  ?left ventricle has normal function. Left ventricular endocardial border  ?not optimally defined to evaluate regional wall motion. There is moderate  ?left ventricular hypertrophy. Left  ?ventricular diastolic parameters are consistent with Grade I diastolic  ?dysfunction (impaired relaxation).  ? 2. Right ventricular systolic function is normal. The right ventricular  ?size is normal.  ? 3. Left atrial size was mildly dilated.  ? 4. Right atrial size was mildly dilated.  ? 5. The mitral valve was not well visualized. No  evidence of mitral valve  ?regurgitation. No evidence of mitral stenosis.  ? 6. 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 There is moderate calcific

## 2021-12-26 ENCOUNTER — Other Ambulatory Visit: Payer: Self-pay | Admitting: Physician Assistant

## 2021-12-26 ENCOUNTER — Ambulatory Visit (INDEPENDENT_AMBULATORY_CARE_PROVIDER_SITE_OTHER): Payer: Medicare Other | Admitting: Physician Assistant

## 2021-12-26 ENCOUNTER — Encounter: Payer: Self-pay | Admitting: Physician Assistant

## 2021-12-26 VITALS — BP 156/69 | HR 59 | Ht 70.0 in | Wt 202.0 lb

## 2021-12-26 DIAGNOSIS — I493 Ventricular premature depolarization: Secondary | ICD-10-CM | POA: Diagnosis not present

## 2021-12-26 DIAGNOSIS — I10 Essential (primary) hypertension: Secondary | ICD-10-CM

## 2021-12-26 DIAGNOSIS — I471 Supraventricular tachycardia: Secondary | ICD-10-CM

## 2021-12-26 DIAGNOSIS — R002 Palpitations: Secondary | ICD-10-CM

## 2021-12-26 DIAGNOSIS — R011 Cardiac murmur, unspecified: Secondary | ICD-10-CM

## 2021-12-26 DIAGNOSIS — E876 Hypokalemia: Secondary | ICD-10-CM

## 2021-12-26 DIAGNOSIS — R55 Syncope and collapse: Secondary | ICD-10-CM

## 2021-12-26 LAB — HM DIABETES EYE EXAM

## 2021-12-26 NOTE — Patient Instructions (Addendum)
Medication Instructions:  ?No changes at this time.  ? ?*If you need a refill on your cardiac medications before your next appointment, please call your pharmacy* ? ? ?Lab Work: ?None ? ?If you have labs (blood work) drawn today and your tests are completely normal, you will receive your results only by: ?MyChart Message (if you have MyChart) OR ?A paper copy in the mail ?If you have any lab test that is abnormal or we need to change your treatment, we will call you to review the results. ? ? ?Testing/Procedures: ?You are scheduled for a Transesophageal echocardiogram (TEE) on Thursday April 13 th with Dr. Rockey Situ. ? ?Please arrive at the [place] [facility] at 07:30 a.m. on the day of your procedure. ? ?DIET INSTRUCTIONS:  ?Nothing to eat or drink after midnight except your medications with a small sip of water. ?    ? ?Medications:  Do not take your Metformin the night before or morning of your procedure. YOU MAY TAKE ALL other medications with a small amount of water. ? ?Must have a responsible person to drive you home. ? ?Bring a current list of your medications and current insurance cards.  ? ? ?If you have any questions after you get home, please call the office at [phone] ? ?[facility] ? ?Your caregiver has ordered a Stress Test with nuclear imaging. The purpose of this test is to evaluate the blood supply to your heart muscle. This procedure is referred to as a "Non-Invasive Stress Test." This is because other than having an IV started in your vein, nothing is inserted or "invades" your body. Cardiac stress tests are done to find areas of poor blood flow to the heart by determining the extent of coronary artery disease (CAD). Some patients exercise on a treadmill, which naturally increases the blood flow to your heart, while others who are  unable to walk on a treadmill due to physical limitations have a pharmacologic/chemical stress agent called Lexiscan . This medicine will mimic walking on a treadmill  by temporarily increasing your coronary blood flow.  ? ?Please note: these test may take anywhere between 2-4 hours to complete ? ?PLEASE REPORT TO [facility] MEDICAL MALL ENTRANCE  ?THE VOLUNTEERS AT THE FIRST DESK WILL DIRECT YOU WHERE TO GO ? ?Date of Procedure:______________________ ? ?Arrival Time for Procedure:___________________________ ? ?Instructions regarding medication:  ? ?_XX___ : Hold diabetes medication (Metformin) the night before and morning of procedure ? ? ?PLEASE NOTIFY THE OFFICE AT LEAST 15 HOURS IN ADVANCE IF YOU ARE UNABLE TO KEEP YOUR APPOINTMENT.  [phone] ?AND  ?PLEASE NOTIFY NUCLEAR MEDICINE AT [facility] AT LEAST 22 HOURS IN ADVANCE IF YOU ARE UNABLE TO KEEP YOUR APPOINTMENT. [phone] ? ?How to prepare for your My[facility] test: ? ?Do not eat or drink after midnight ?No caffeine for 24 hours prior to test ?No smoking 24 hours prior to test. ?Your medication may be taken with water.  If your doctor stopped a medication because of this test, do not take that medication. ?Ladies, please do not wear dresses.  Skirts or pants are appropriate. Please wear a short sleeve shirt. ?No perfume, cologne or lotion. ?Wear comfortable walking shoes. No heels! ? ? ? ? ? ?Follow-Up: ?At [facility], you and your health needs are our priority.  As part of our continuing mission to provide you with exceptional heart care, we have created designated Provider Care Teams.  These Care Teams include your primary Cardiologist (physician) and Advanced Practice Providers (APPs -  Physician Assistants and Nurse Practitioners) who all work together to provide you with the care you need, when you need it. ? ?Referral placed to see Electrophysiologist.  ? ?Your next appointment:   ?2 month(s) ? ?The format for your next appointment:   ?In Person ? ?Provider:   ?Nelva Bush, MD or Christell Faith, PA-C ?

## 2021-12-26 NOTE — Progress Notes (Signed)
Orders for TEE have been signed and held. ?

## 2022-01-01 ENCOUNTER — Other Ambulatory Visit: Payer: Self-pay | Admitting: Physician Assistant

## 2022-01-02 ENCOUNTER — Telehealth: Payer: Self-pay | Admitting: Physician Assistant

## 2022-01-02 ENCOUNTER — Telehealth: Payer: Self-pay | Admitting: Cardiovascular Disease

## 2022-01-02 NOTE — Telephone Encounter (Signed)
Spoke with pt, he is having dinner guests the night after his TEE and wanted to make sure he would be okay. Explained he can not drive home after the procedure but he should be fine later in the evening. ?

## 2022-01-02 NOTE — Telephone Encounter (Signed)
Patient is calling to ask about the procedure he is having. Please call back ?

## 2022-01-05 MED ORDER — SODIUM CHLORIDE 0.9 % IV SOLN
INTRAVENOUS | Status: DC
  Administered 2022-01-06: 1000 mL via INTRAVENOUS

## 2022-01-06 ENCOUNTER — Encounter
Admission: RE | Disposition: A | Discharge status: Home / Self Care | Payer: Self-pay | Source: Home / Self Care | Attending: Cardiovascular Disease

## 2022-01-06 ENCOUNTER — Encounter: Payer: Self-pay | Admitting: Cardiovascular Disease

## 2022-01-06 ENCOUNTER — Ambulatory Visit (HOSPITAL_BASED_OUTPATIENT_CLINIC_OR_DEPARTMENT_OTHER)
Admission: RE | Admit: 2022-01-06 | Discharge: 2022-01-06 | Disposition: A | Discharge status: Home / Self Care | Payer: Medicare Other | Source: Home / Self Care | Attending: Physician Assistant | Admitting: Physician Assistant

## 2022-01-06 ENCOUNTER — Other Ambulatory Visit: Payer: Self-pay

## 2022-01-06 ENCOUNTER — Telehealth: Payer: Self-pay | Admitting: *Deleted

## 2022-01-06 ENCOUNTER — Ambulatory Visit
Admission: RE | Admit: 2022-01-06 | Discharge: 2022-01-06 | Disposition: A | Discharge status: Home / Self Care | Payer: Medicare Other | Attending: Cardiovascular Disease | Admitting: Cardiovascular Disease

## 2022-01-06 DIAGNOSIS — I700 Atherosclerosis of aorta: Secondary | ICD-10-CM | POA: Diagnosis not present

## 2022-01-06 DIAGNOSIS — I340 Nonrheumatic mitral (valve) insufficiency: Secondary | ICD-10-CM

## 2022-01-06 DIAGNOSIS — I10 Essential (primary) hypertension: Secondary | ICD-10-CM | POA: Insufficient documentation

## 2022-01-06 DIAGNOSIS — I361 Nonrheumatic tricuspid (valve) insufficiency: Secondary | ICD-10-CM

## 2022-01-06 DIAGNOSIS — I471 Supraventricular tachycardia: Secondary | ICD-10-CM | POA: Diagnosis not present

## 2022-01-06 DIAGNOSIS — I359 Nonrheumatic aortic valve disorder, unspecified: Secondary | ICD-10-CM | POA: Diagnosis not present

## 2022-01-06 DIAGNOSIS — E785 Hyperlipidemia, unspecified: Secondary | ICD-10-CM | POA: Diagnosis not present

## 2022-01-06 DIAGNOSIS — E119 Type 2 diabetes mellitus without complications: Secondary | ICD-10-CM | POA: Diagnosis not present

## 2022-01-06 DIAGNOSIS — R011 Cardiac murmur, unspecified: Secondary | ICD-10-CM | POA: Diagnosis not present

## 2022-01-06 DIAGNOSIS — Z7984 Long term (current) use of oral hypoglycemic drugs: Secondary | ICD-10-CM | POA: Insufficient documentation

## 2022-01-06 HISTORY — PX: TEE WITHOUT CARDIOVERSION: SHX5443

## 2022-01-06 SURGERY — ECHOCARDIOGRAM, TRANSESOPHAGEAL
Anesthesia: Moderate Sedation

## 2022-01-06 MED ORDER — LIDOCAINE VISCOUS HCL 2 % MT SOLN
OROMUCOSAL | Status: AC
  Administered 2022-01-06: 15 mL
  Filled 2022-01-06: qty 15

## 2022-01-06 MED ORDER — FENTANYL CITRATE (PF) 100 MCG/2ML IJ SOLN
INTRAMUSCULAR | Status: AC | PRN
  Administered 2022-01-06: 50 ug via INTRAVENOUS

## 2022-01-06 MED ORDER — MIDAZOLAM HCL 2 MG/2ML IJ SOLN
INTRAMUSCULAR | Status: AC
  Filled 2022-01-06: qty 4

## 2022-01-06 MED ORDER — FENTANYL CITRATE (PF) 100 MCG/2ML IJ SOLN
INTRAMUSCULAR | Status: AC
  Filled 2022-01-06: qty 2

## 2022-01-06 MED ORDER — BUTAMBEN-TETRACAINE-BENZOCAINE 2-2-14 % EX AERO
INHALATION_SPRAY | CUTANEOUS | Status: AC
Start: 2022-01-06 — End: 2022-01-06
  Administered 2022-01-06: 3
  Filled 2022-01-06: qty 5

## 2022-01-06 MED ORDER — MIDAZOLAM HCL 5 MG/5ML IJ SOLN
INTRAMUSCULAR | Status: AC | PRN
  Administered 2022-01-06: 2 mg via INTRAVENOUS

## 2022-01-06 NOTE — Progress Notes (Signed)
Transesophageal Echocardiogram : ? ?Indication: Aortic valve disorder ?Requesting/ordering  physician:  ? ?Procedure: Benzocaine spray x2 and 2 mls x 2 of viscous lidocaine were given orally to provide local anesthesia to the oropharynx. The patient was positioned supine on the left side, bite block provided. The patient was moderately sedated with the doses of versed and fentanyl as detailed below.  Using digital technique an omniplane probe was advanced into the distal esophagus without incident.  ? ?Moderate sedation: ?1. Sedation used:  Versed: 2 mg, fentanyl 50 mcg IV ?2. Time administered: 8 AM   time when patient started recovery: 8:30 AM ?Total sedation time 30 minutes ?3. I was face to face during this time ? ?See report in EPIC  for complete details: ?In brief, transgastric imaging revealed normal LV function with no RWMAs and no mural apical thrombus.  .  Estimated ejection fraction was 55% %.  Right sided cardiac chambers were normal with no evidence of pulmonary hypertension. ? ?Aortic valve is trileaflet, heavy calcium noted on the noncoronary cusp, estimated valve area by planar measurements greater than 2 cm/no significant stenosis noted.  No significant regurgitation ? ?Mild TR ? ?Imaging of the septum showed no ASD or VSD ?Bubble study was negative for shunt ?2D and color flow confirmed no PFO ? ?The LA was well visualized in orthogonal views.  There was no spontaneous contrast and no thrombus in the LA and LA appendage  ? ?The descending thoracic aorta had no  mural aortic debris with no evidence of aneurysmal dilation or disection.  Moderate diffuse aortic atherosclerosis, notable calcified plaque in the arch ? ? ?Joseph Cook ?01/06/2022 ?8:42 AM  ?

## 2022-01-06 NOTE — Telephone Encounter (Signed)
-----   Message from Rise Mu, PA-C sent at 01/06/2022  4:13 PM EDT ----- ?Please inform the patient his TEE showed normal pump function with a mildly leaky mitral valve.  The aortic valve is tricuspid with severe calcification on the noncoronary cusp without evidence of leakiness or narrowing.  The thickening of the aortic valve is contributing to his murmur.  There was also aortic atherosclerosis noted with most recent LDL being well controlled.  Overall reassuring echo.  No indication for further testing at this time. ?

## 2022-01-06 NOTE — Progress Notes (Signed)
*  PRELIMINARY RESULTS* ?Echocardiogram ?2D Echocardiogram has been performed. ? ?Joseph Cook, Sonia Side ?01/06/2022, 8:38 AM ?

## 2022-01-06 NOTE — Interval H&P Note (Signed)
History and Physical Interval Note: ? ?01/06/2022 ?6:18 PM ? ?Joseph Cook  has presented today for surgery, with the diagnosis of TEE   Congenital malformation aortic valve.  The various methods of treatment have been discussed with the patient and family. After consideration of risks, benefits and other options for treatment, the patient has consented to  Procedure(s): ?TRANSESOPHAGEAL ECHOCARDIOGRAM (TEE) (N/A) as a surgical intervention.  The patient's history has been reviewed, patient examined, no change in status, stable for surgery.  I have reviewed the patient's chart and labs.  Questions were answered to the patient's satisfaction.   ? ? ?[name] ? ? ?

## 2022-01-06 NOTE — Telephone Encounter (Signed)
Left voicemail message to call back for review of results.  

## 2022-01-06 NOTE — H&P (Signed)
H&P Addendum, pre-transesophageal echo  Patient was seen and evaluated prior to TEE procedure Symptoms, prior testing details again confirmed with the patient Patient examined, no significant change from prior exam Lab work reviewed in detail personally by myself Patient understands risk and benefit of the procedure, willing to proceed  Signed, Tim Chrysta Fulcher, MD, Ph.D [facility]  

## 2022-01-09 ENCOUNTER — Encounter: Payer: Self-pay | Admitting: Cardiovascular Disease

## 2022-01-09 NOTE — Telephone Encounter (Signed)
Reviewed results and recommendations with patient. He verbalized understanding with no further questions at this time.  

## 2022-01-12 ENCOUNTER — Ambulatory Visit
Admission: RE | Admit: 2022-01-12 | Discharge: 2022-01-12 | Disposition: A | Discharge status: Home / Self Care | Payer: Medicare Other | Source: Ambulatory Visit | Attending: Physician Assistant | Admitting: Physician Assistant

## 2022-01-12 DIAGNOSIS — R55 Syncope and collapse: Secondary | ICD-10-CM | POA: Insufficient documentation

## 2022-01-12 MED ORDER — REGADENOSON 0.4 MG/5ML IV SOLN
0.4000 mg | Freq: Once | INTRAVENOUS | Status: AC
  Administered 2022-01-12: 0.4 mg via INTRAVENOUS
  Filled 2022-01-12: qty 5

## 2022-01-12 MED ORDER — TECHNETIUM TC 99M TETROFOSMIN IV KIT
10.0000 | PACK | Freq: Once | INTRAVENOUS | Status: AC | PRN
  Administered 2022-01-12: 10.7 via INTRAVENOUS

## 2022-01-12 MED ORDER — TECHNETIUM TC 99M TETROFOSMIN IV KIT
30.0000 | PACK | Freq: Once | INTRAVENOUS | Status: AC | PRN
  Administered 2022-01-12: 33 via INTRAVENOUS

## 2022-01-13 LAB — NM MYOCAR MULTI W/SPECT W/WALL MOTION / EF
LV dias vol: 102 mL (ref 62–150)
LV sys vol: 40 mL
Nuc Stress EF: 61 %
Peak HR: 81 {beats}/min
Percent HR: 57 %
Rest HR: 52 {beats}/min
Rest Nuclear Isotope Dose: 10.7 mCi
SDS: 6
SRS: 6
SSS: 7
ST Depression (mm): 0 mm
Stress Nuclear Isotope Dose: 33 mCi
TID: 1.02

## 2022-01-15 ENCOUNTER — Other Ambulatory Visit: Payer: Self-pay | Admitting: Physician Assistant

## 2022-01-15 DIAGNOSIS — E119 Type 2 diabetes mellitus without complications: Secondary | ICD-10-CM

## 2022-01-16 ENCOUNTER — Telehealth: Payer: Self-pay | Admitting: *Deleted

## 2022-01-16 NOTE — Telephone Encounter (Signed)
Left voicemail message to call back for review of his results.  ?

## 2022-01-16 NOTE — Telephone Encounter (Signed)
Left voicemail message to call back for review of results.  

## 2022-01-16 NOTE — Telephone Encounter (Signed)
-----   Message from Rise Mu, PA-C sent at 01/13/2022  3:22 PM EDT ----- ?Please inform the patient his stress test showed no evidence of blockage with a normal pump function and was overall low risk.  CT imaging did show calcification of the coronary arteries and aortic atherosclerosis, with last LDL at goal.  Continue current medications and risk factor modification. ?

## 2022-01-16 NOTE — Telephone Encounter (Signed)
Reviewed results with patient and he verbalized understanding with no further questions at this time. 

## 2022-01-16 NOTE — Telephone Encounter (Signed)
Follow Up: ? ? ? ? ?Patient is returning Pam's call from today, concerning his lab results. ?

## 2022-01-22 NOTE — Progress Notes (Signed)
?Electrophysiology Office Note:   ? ?Date:  01/25/2022  ? ?IDDeandrew Hoecker, DOB June 20, 1941, MRN [id number] ? ?PCP:  Mikey Kirschner, PA-C  ?[facility] Cardiologist:  Nelva Bush, MD  ?[facility] Electrophysiologist:  Vickie Epley, MD  ? ?Referring MD: Rise Mu, PA-C  ? ?Chief Complaint: SVT ? ?History of Present Illness:   ? ?Joseph Cook is a 81 y.o. male who presents for an evaluation of SVT at the request of Christell Faith, PA-C. Their medical history includes SVT, DM, HTN, HLD. The patient was seen by Thurmond Butts on 12/26/2021. He was hospitalized in February with lightheadedness. While hospitalized, episodes of SVT were noted on the monitor. Metoprolol was added. Outpatient monitoring was pursued which showed sustained episodes of tachycardia with rates up to 176 bpm.  ? ?He tells me today that when the episodes occur he feels like his heart is "dropping".  He has felt presyncopal during these episodes.  He tells me he has had PVCs for decades.  He tells me that since he increase the dose of his metoprolol to 100 mg by mouth once daily the episodes have significantly reduced in frequency. ? ? ?  ?Past Medical History:  ?Diagnosis Date  ? Actinic keratosis   ? Basal cell carcinoma   ? Cataract   ? Diabetes mellitus   ? Dysplastic nevus 08/13/2009  ? right medial lower leg above medial ankle, malleolus -severe  ? Glaucoma   ? Hyperlipidemia   ? Hypertension   ? PVC (premature ventricular contraction)   ? ? ?Past Surgical History:  ?Procedure Laterality Date  ? HERNIA REPAIR    ? ROTATOR CUFF REPAIR    ? TEE WITHOUT CARDIOVERSION N/A 01/06/2022  ? Procedure: TRANSESOPHAGEAL ECHOCARDIOGRAM (TEE);  Surgeon: Minna Merritts, MD;  Location: [facility] ORS;  Service: Cardiovascular;  Laterality: N/A;  ? ? ?Current Medications: ?Current Meds  ?Medication Sig  ? aspirin 325 MG tablet Take 325 mg by mouth daily.  ? atorvastatin (LIPITOR) 10 MG tablet TAKE 1 TABLET BY MOUTH EVERYDAY AT BEDTIME (Patient taking  differently: Take 10 mg by mouth at bedtime.)  ? blood glucose meter kit and supplies KIT Dispense based on patient and insurance preference. Use up to four times daily as directed.  ? Cholecalciferol (VITAMIN D3) 50 MCG (2000 UT) TABS Take 2,000 Units by mouth daily.   ? lisinopril (ZESTRIL) 20 MG tablet TAKE 1 TABLET BY MOUTH EVERY DAY  ? metFORMIN (GLUCOPHAGE) 500 MG tablet Take 1 tablet (500 mg total) by mouth 2 (two) times daily with a meal.  ? metoprolol succinate (TOPROL-XL) 100 MG 24 hr tablet TAKE 1 TABLET BY MOUTH DAILY. TAKE WITH OR IMMEDIATELY FOLLOWING A MEAL.  ? Multiple Vitamins-Minerals (PRESERVISION AREDS 2 PO) Take 1 capsule by mouth 2 (two) times daily.   ? omeprazole (PRILOSEC) 40 MG capsule TAKE 1 CAPSULE BY MOUTH EVERY DAY (Patient taking differently: Take 40 mg by mouth daily.)  ? tamsulosin (FLOMAX) 0.4 MG CAPS capsule TAKE 1 CAPSULE BY MOUTH EVERY DAY (Patient taking differently: Take 0.4 mg by mouth daily.)  ?  ? ?Allergies:   Patient has no known allergies.  ? ?Social History  ? ?Socioeconomic History  ? Marital status: Married  ?  Spouse name: Not on file  ? Number of children: 5  ? Years of education: Not on file  ? Highest education level: Some college, no degree  ?Occupational History  ? Occupation: retired  ?Tobacco Use  ? Smoking status: Never  ?  Smokeless tobacco: Former  ?Vaping Use  ? Vaping Use: Never used  ?Substance and Sexual Activity  ? Alcohol use: Not Currently  ? Drug use: No  ? Sexual activity: Not on file  ?Other Topics Concern  ? Not on file  ?Social History Narrative  ? Not on file  ? ?Social Determinants of Health  ? ?Financial Resource Strain: Not on file  ?Food Insecurity: Not on file  ?Transportation Needs: Not on file  ?Physical Activity: Not on file  ?Stress: Not on file  ?Social Connections: Not on file  ?  ? ?Family History: ?The patient's family history includes Alzheimer's disease in his maternal aunt; Cancer in his maternal grandmother and paternal  grandmother; Heart attack in his father and maternal uncle; Stroke in his mother. ? ?ROS:   ?Please see the history of present illness.    ?All other systems reviewed and are negative. ? ?EKGs/Labs/Other Studies Reviewed:   ? ?The following studies were reviewed today: ? ?01/06/2022 TEE ?Normal LV ?Normal RV ?Mild MR ?Calcified NCC of the AV without stenosis. ?Moderate aortic atherosclerosis ? ?01/12/2022 SPECT ?No ischemia ?Moderate coronary calcifications ? ? ?12/06/2021 [place]io ?The patient was monitored for 14 days. ?The predominant rhythm was sinus with an average rate of 59 bpm (range 45-111 bpm in sinus). ?There were occasional PAC's and rare PVC's. ?A single episode of nonsustained ventricular tachycardia was observed, lasting 5 beats with a maximum rate of 158 bpm. ?There were 77 atrial runs lasting up to 4 minutes, 11 seconds, with a maximum rate of 176 bpm. ?No prolonged pause occurred. ?Patient triggered events correspond to sinus rhythm, PAC's, PVC's, and SVT. ?  ? ? ? ? ? ?Recent Labs: ?08/12/2021: ALT 21 ?11/14/2021: TSH 3.723 ?11/15/2021: Hemoglobin 14.6; Platelets 148 ?11/16/2021: Magnesium 1.7 ?11/25/2021: BUN 15; Creatinine, Ser 0.73; Potassium 4.1; Sodium 142  ?Recent Lipid Panel ?   ?Component Value Date/Time  ? CHOL 124 08/12/2021 0901  ? TRIG 158 (H) 08/12/2021 0901  ? HDL 37 (L) 08/12/2021 0901  ? CHOLHDL 3.4 08/12/2021 0901  ? [place] 60 08/12/2021 0901  ? ? ?Physical Exam:   ? ?VS:  BP 138/68   Pulse (!) 58   Ht '5\' 10"'  (1.778 m)   Wt 195 lb (88.5 kg)   SpO2 95%   BMI 27.98 kg/m?    ? ?Wt Readings from Last 3 Encounters:  ?01/25/22 195 lb (88.5 kg)  ?01/06/22 200 lb (90.7 kg)  ?12/26/21 202 lb (91.6 kg)  ?  ? ?GEN:  Well nourished, well developed in no acute distress.  Obese ?HEENT: Normal ?NECK: No JVD; No carotid bruits ?LYMPHATICS: No lymphadenopathy ?CARDIAC: 3-6 crescendo-decrescendo holosystolic murmur mid peaking, rubs, gallops ?RESPIRATORY:  Clear to auscultation without rales, wheezing or  rhonchi  ?ABDOMEN: Soft, non-tender, non-distended ?MUSCULOSKELETAL:  No edema; No deformity  ?SKIN: Warm and dry ?NEUROLOGIC:  Alert and oriented x 3 ?PSYCHIATRIC:  Normal affect  ? ? ?  ? ?ASSESSMENT:   ? ?1. SVT (supraventricular tachycardia) ([place])   ?2. Primary hypertension   ?3. Near syncope   ? ?PLAN:   ? ?In order of problems listed above: ? ? ?#SVT ?Unclear etiology. Ddx would include AT and AVNRT. IC are not options given coronary disease on CT. today I discussed amiodarone and EP study with possible ablation.  We also discussed staying on metoprolol and monitoring for changes in frequency of the episodes.  He is inclined to pursue a more conservative approach which I think is reasonable especially  given the improved frequency on the higher dose metoprolol.  I have asked him to reach out to [place] should he experience more presyncopal episodes or if he notices the frequency of SVT episodes becoming higher.  If that occurs, would favor starting amiodarone.  Regimen would be 200 mg by mouth twice daily for 14 days followed by 200 mg by mouth daily thereafter.  We discussed the need for ongoing monitoring of liver thyroid and lung function while on amiodarone. ? ?#PVCs ?Also likely contributing to some of his symptoms.  I think his burden has improved on the higher dose metoprolol.  This would also be treated by amiodarone as above. ? ?#Hypertension ?At goal.  Continue current medical therapy. ? ?Follow-up with me on an as-needed basis.  Continue routine follow-up with Christell Faith, PA-C. ? ? ?Medication Adjustments/Labs and Tests Ordered: ?Current medicines are reviewed at length with the patient today.  Concerns regarding medicines are outlined above.  ?No orders of the defined types were placed in this encounter. ? ?No orders of the defined types were placed in this encounter. ? ? ? ?Signed, ?Lysbeth Galas T. Quentin Ore, MD, [facility], [place] ?01/25/2022 2:13 PM    ?Electrophysiology ?[place] ?

## 2022-01-25 ENCOUNTER — Other Ambulatory Visit: Payer: Self-pay | Admitting: Physician Assistant

## 2022-01-25 ENCOUNTER — Encounter: Payer: Self-pay | Admitting: Cardiology

## 2022-01-25 ENCOUNTER — Ambulatory Visit (INDEPENDENT_AMBULATORY_CARE_PROVIDER_SITE_OTHER): Payer: Medicare Other | Admitting: Cardiology

## 2022-01-25 VITALS — BP 138/68 | HR 58 | Ht 70.0 in | Wt 195.0 lb

## 2022-01-25 DIAGNOSIS — I471 Supraventricular tachycardia: Secondary | ICD-10-CM | POA: Diagnosis not present

## 2022-01-25 DIAGNOSIS — R55 Syncope and collapse: Secondary | ICD-10-CM | POA: Diagnosis not present

## 2022-01-25 DIAGNOSIS — I10 Essential (primary) hypertension: Secondary | ICD-10-CM

## 2022-01-25 NOTE — Patient Instructions (Signed)
Medications: ?Your physician recommends that you continue on your current medications as directed. Please refer to the Current Medication list given to you today. ?*If you need a refill on your cardiac medications before your next appointment, please call your pharmacy* ? ?Lab Work: ?None. ?If you have labs (blood work) drawn today and your tests are completely normal, you will receive your results only by: ?MyChart Message (if you have MyChart) OR ?A paper copy in the mail ?If you have any lab test that is abnormal or we need to change your treatment, we will call you to review the results. ? ?Testing/Procedures: ?None. ? ?Follow-Up: ?At [facility], you and your health needs are our priority.  As part of our continuing mission to provide you with exceptional heart care, we have created designated Provider Care Teams.  These Care Teams include your primary Cardiologist (physician) and Advanced Practice Providers (APPs -  Physician Assistants and Nurse Practitioners) who all work together to provide you with the care you need, when you need it. ? ?Your physician wants you to follow-up in: As needed with Lars Mage, MD  ? ?We recommend signing up for the patient portal called "MyChart".  Sign up information is provided on this After Visit Summary.  MyChart is used to connect with patients for Virtual Visits (Telemedicine).  Patients are able to view lab/test results, encounter notes, upcoming appointments, etc.  Non-urgent messages can be sent to your provider as well.   ?To learn more about what you can do with MyChart, go to [email or web address].   ? ?Any Other Special Instructions Will Be Listed Below (If Applicable). ? ?

## 2022-01-26 ENCOUNTER — Other Ambulatory Visit: Payer: Self-pay | Admitting: Physician Assistant

## 2022-01-26 DIAGNOSIS — E7800 Pure hypercholesterolemia, unspecified: Secondary | ICD-10-CM

## 2022-01-26 NOTE — Telephone Encounter (Signed)
Requested Prescriptions  ?Pending Prescriptions Disp Refills  ?? atorvastatin (LIPITOR) 10 MG tablet [Pharmacy Med Name: ATORVASTATIN 10 MG TABLET] 90 tablet 1  ?  Sig: TAKE 1 TABLET BY MOUTH EVERYDAY AT BEDTIME  ?  ? Cardiovascular:  Antilipid - Statins Failed - 01/26/2022  3:02 AM  ?  ?  Failed - Lipid Panel in normal range within the last 12 months  ?  Cholesterol, Total  ?Date Value Ref Range Status  ?08/12/2021 124 100 - 199 mg/dL Final  ? ?LDL Chol Calc (NIH)  ?Date Value Ref Range Status  ?08/12/2021 60 0 - 99 mg/dL Final  ? ?HDL  ?Date Value Ref Range Status  ?08/12/2021 37 (L) >39 mg/dL Final  ? ?Triglycerides  ?Date Value Ref Range Status  ?08/12/2021 158 (H) 0 - 149 mg/dL Final  ? ?  ?  ?  Passed - Patient is not pregnant  ?  ?  Passed - Valid encounter within last 12 months  ?  Recent Outpatient Visits   ?      ? 2 months ago SVT (supraventricular tachycardia) ([place])  ? [facility] Thedore Mins, Ria Comment, PA-C  ? 3 months ago Acute non-recurrent frontal sinusitis  ? [facility] Thedore Mins, Ria Comment, PA-C  ? 5 months ago Controlled type 2 diabetes mellitus without complication, without long-term current use of insulin ([place])  ? [facility] Thedore Mins, Ria Comment, PA-C  ? 11 months ago Type 2 diabetes mellitus with diabetic cataract, without long-term current use of insulin ([place])  ? [facility] Fisher, Kirstie Peri, MD  ? 1 year ago Type 2 diabetes mellitus with diabetic cataract, without long-term current use of insulin ([place])  ? [place], [place]  ?  ?  ?Future Appointments   ?        ? In 2 weeks Drubel, Ria Comment, PA-C [facility], [facility]  ? In 1 month Dunn, Areta Haber, PA-C [facility], [facility]  ? In 3 months Ralene Bathe, MD [place]  ?  ? ?  ?  ?  ? ?

## 2022-02-08 NOTE — Progress Notes (Signed)
I,Sha'taria Tyson,acting as a Education administrator for [employer], PA-C.,have documented all relevant documentation on the behalf of Joseph Kirschner, PA-C,as directed by  Joseph Kirschner, PA-C while in the presence of Joseph Kirschner, PA-C.   Established patient visit   Patient: Joseph Cook   DOB: [birth year]   81 y.o. Male  MRN: [id number] Visit Date: 02/09/2022  Today's healthcare provider: Mikey Kirschner, PA-C   Cc. DMII, HTN f/u  Subjective    HPI  Diabetes Mellitus Type II, follow-up  Lab Results  Component Value Date   HGBA1C 8.7 (H) 11/14/2021   HGBA1C 6.8 (A) 08/12/2021   HGBA1C 6.6 (A) 02/18/2021   Last seen for diabetes 6 months ago.  Management since then includes continuing the same treatment. He reports excellent compliance with treatment. He is not having side effects.   Home blood sugar records: fasting range: 118-190's  Episodes of hypoglycemia? No    Current insulin regiment: none Most Recent Eye Exam: UTD  --------------------------------------------------------------------------------------------------- Hypertension, follow-up  BP Readings from Last 3 Encounters:  02/09/22 (!) 168/81  01/25/22 138/68  01/06/22 138/70   Wt Readings from Last 3 Encounters:  02/09/22 194 lb 9.6 oz (88.3 kg)  01/25/22 195 lb (88.5 kg)  01/06/22 200 lb (90.7 kg)     He was last seen for hypertension 6 months ago.  BP at that visit was see above. Management since that visit includes  He reports excellent compliance with treatment. He is not having side effects.  He is exercising. He is adherent to low salt diet.   Outside blood pressures are not being checked.  He does not smoke.  Use of agents associated with hypertension: none.   --------------------------------------------------------------------------------------------------- Medications: Outpatient Medications Prior to Visit  Medication Sig   aspirin 325 MG tablet Take 325 mg by mouth daily.    atorvastatin (LIPITOR) 10 MG tablet TAKE 1 TABLET BY MOUTH EVERYDAY AT BEDTIME   blood glucose meter kit and supplies KIT Dispense based on patient and insurance preference. Use up to four times daily as directed.   Cholecalciferol (VITAMIN D3) 50 MCG (2000 UT) TABS Take 2,000 Units by mouth daily.    lisinopril (ZESTRIL) 20 MG tablet TAKE 1 TABLET BY MOUTH EVERY DAY   metFORMIN (GLUCOPHAGE) 500 MG tablet Take 1 tablet (500 mg total) by mouth 2 (two) times daily with a meal.   metoprolol succinate (TOPROL-XL) 100 MG 24 hr tablet TAKE 1 TABLET BY MOUTH EVERY DAY WITH OR IMMEDIATELY FOLLOWING A MEAL   Multiple Vitamins-Minerals (PRESERVISION AREDS 2 PO) Take 1 capsule by mouth 2 (two) times daily.    omeprazole (PRILOSEC) 40 MG capsule TAKE 1 CAPSULE BY MOUTH EVERY DAY (Patient taking differently: Take 40 mg by mouth daily.)   tamsulosin (FLOMAX) 0.4 MG CAPS capsule TAKE 1 CAPSULE BY MOUTH EVERY DAY (Patient taking differently: Take 0.4 mg by mouth daily.)   No facility-administered medications prior to visit.    Review of Systems  Constitutional:  Negative for fatigue and fever.  Respiratory:  Negative for cough and shortness of breath.   Cardiovascular:  Negative for chest pain, palpitations and leg swelling.  Neurological:  Negative for dizziness and headaches.      Objective    Blood pressure (!) 168/81, pulse (!) 53, height $RemoveBe'5\' 10"'[id number]$  (1.778 m), weight 194 lb 9.6 oz (88.3 kg), SpO2 100 %.   Physical Exam Constitutional:      General: He is awake.     Appearance: He is well-developed.  HENT:     Head: Normocephalic.  Eyes:     Conjunctiva/sclera: Conjunctivae normal.  Cardiovascular:     Rate and Rhythm: Normal rate and regular rhythm.     Heart sounds: Normal heart sounds.  Pulmonary:     Effort: Pulmonary effort is normal.     Breath sounds: Normal breath sounds.  Skin:    General: Skin is warm.  Neurological:     Mental Status: He is alert and oriented to person, place, and  time.  Psychiatric:        Attention and Perception: Attention normal.        Mood and Affect: Mood normal.        Speech: Speech normal.        Behavior: Behavior is cooperative.     No results found for any visits on 02/09/22.  Assessment & Plan     Problem List Items Addressed This Visit       Cardiovascular and Mediastinum   HTN (hypertension)    Was in range at cardiologist appt 01/25/22; managed by cardiology Takes lisinopril 20 mg and metoprolol 100 mg.         Endocrine   Diabetes mellitus type II, controlled ([place]) - Primary    Managed with 500 mg BID. Well controlled. Over the last week some higher readings, will monitor advised pt if he continues to have high readings for the next 2 weeks, we can check his meter against ours or do a fasting glucose level w/ bloodwork.  Pt has some hesitations about metformin, as we are well controlled with no side effects, I am hesitant to change to a new medication. Explained possible side effects of metformin, explained his kidney function is excellent, with uacr 11/22 in range and GFR consistently > 60.         Relevant Orders   POCT HgB A1C     Return in about 6 months (around 08/12/2022) for DMII.      I, Joseph Kirschner, PA-C have reviewed all documentation for this visit. The documentation on  02/09/2022  for the exam, diagnosis, procedures, and orders are all accurate and complete.  Joseph Kirschner, PA-C [facility] [address] [place], [place], [address] Office: [phone] Fax: [place]

## 2022-02-09 ENCOUNTER — Encounter: Payer: Self-pay | Admitting: Physician Assistant

## 2022-02-09 ENCOUNTER — Other Ambulatory Visit: Payer: Self-pay | Admitting: Physician Assistant

## 2022-02-09 ENCOUNTER — Ambulatory Visit (INDEPENDENT_AMBULATORY_CARE_PROVIDER_SITE_OTHER): Payer: Medicare Other | Admitting: Physician Assistant

## 2022-02-09 VITALS — BP 168/81 | HR 53 | Ht 70.0 in | Wt 194.6 lb

## 2022-02-09 DIAGNOSIS — E119 Type 2 diabetes mellitus without complications: Secondary | ICD-10-CM | POA: Diagnosis not present

## 2022-02-09 DIAGNOSIS — I10 Essential (primary) hypertension: Secondary | ICD-10-CM | POA: Diagnosis not present

## 2022-02-09 LAB — POCT GLYCOSYLATED HEMOGLOBIN (HGB A1C): Hemoglobin A1C: 6 % — AB (ref 4.0–5.6)

## 2022-02-09 NOTE — Assessment & Plan Note (Signed)
Was in range at cardiologist appt 01/25/22; managed by cardiology Takes lisinopril 20 mg and metoprolol 100 mg.

## 2022-02-09 NOTE — Assessment & Plan Note (Addendum)
Managed with 500 mg BID. Well controlled. Over the last week some higher readings, will monitor advised pt if he continues to have high readings for the next 2 weeks, we can check his meter against ours or do a fasting glucose level w/ bloodwork.  Pt has some hesitations about metformin, as we are well controlled with no side effects, I am hesitant to change to a new medication. Explained possible side effects of metformin, explained his kidney function is excellent, with uacr 11/22 in range and GFR consistently > 60.

## 2022-02-09 NOTE — Assessment & Plan Note (Signed)
>>  ASSESSMENT AND PLAN FOR DIABETES MELLITUS TYPE II, CONTROLLED ([facility]) WRITTEN ON 02/09/2022 10:43 AM BY DRUBEL, LINDSAY, PA-C  Managed with 500 mg BID. Well controlled. Over the last week some higher readings, will monitor advised pt if he continues to have high readings for the next 2 weeks, we can check his meter against ours or do a fasting glucose level w/ bloodwork.  Pt has some hesitations about metformin, as we are well controlled with no side effects, I am hesitant to change to a new medication. Explained possible side effects of metformin, explained his kidney function is excellent, with uacr 11/22 in range and GFR consistently > 60.

## 2022-02-09 NOTE — Telephone Encounter (Signed)
Rx 12/05/21- should be on file at pharmacy Requested Prescriptions  Pending Prescriptions Disp Refills  . Blood Glucose Monitoring Suppl (ONE TOUCH ULTRA 2) w/Device KIT [Pharmacy Med Name: ONE TOUCH ULTRA2 GLUCOSE SYST]      Sig: DISPENSE BASED ON PATIENT AND INSURANCE PREFERENCE. USE UP TO FOUR TIMES DAILY AS DIRECTED.     Endocrinology: Diabetes - Testing Supplies Passed - 02/09/2022 11:43 AM      Passed - Valid encounter within last 12 months    Recent Outpatient Visits          Today Controlled type 2 diabetes mellitus without complication, without long-term current use of insulin [facility])   [facility] Mikey Kirschner, PA-C   2 months ago SVT (supraventricular tachycardia) [facility])   [facility] Mikey Kirschner, PA-C   4 months ago Acute non-recurrent frontal sinusitis   [facility] Thedore Mins, [place], PA-C   6 months ago Controlled type 2 diabetes mellitus without complication, without long-term current use of insulin [facility])   [facility] Thedore Mins, [place], PA-C   11 months ago Type 2 diabetes mellitus with diabetic cataract, without long-term current use of insulin [facility])   [facility] Birdie Sons, MD      Future Appointments            In 4 weeks Dunn, Areta Haber, PA-C [facility], [facility]   In 3 months Ralene Bathe, MD [place]   In 6 months Thedore Mins, Ria Comment, PA-C [facility], [facility]

## 2022-02-10 ENCOUNTER — Other Ambulatory Visit: Payer: Self-pay | Admitting: Physician Assistant

## 2022-02-23 ENCOUNTER — Ambulatory Visit (INDEPENDENT_AMBULATORY_CARE_PROVIDER_SITE_OTHER): Payer: Medicare Other

## 2022-02-23 VITALS — Wt 194.0 lb

## 2022-02-23 DIAGNOSIS — Z0000 Encounter for general adult medical examination without abnormal findings: Secondary | ICD-10-CM | POA: Diagnosis not present

## 2022-02-23 NOTE — Progress Notes (Signed)
Virtual Visit via Telephone Note  I connected with  Joseph Cook on 02/23/22 at  2:30 PM EDT by telephone and verified that I am speaking with the correct person using two identifiers.  Location: Patient: home Provider: [facility] Persons participating in the virtual visit: [place]   I discussed the limitations, risks, security and privacy concerns of performing an evaluation and management service by telephone and the availability of in person appointments. The patient expressed understanding and agreed to proceed.  Interactive audio and video telecommunications were attempted between this nurse and patient, however failed, due to patient having technical difficulties OR patient did not have access to video capability.  We continued and completed visit with audio only.  Some vital signs may be absent or patient reported.   Dionisio David, LPN  Subjective:   Joseph Cook is a 81 y.o. male who presents for Medicare Annual/Subsequent preventive examination.  Review of Systems     Cardiac Risk Factors include: advanced age (>40mn, >>73women);diabetes mellitus;dyslipidemia;hypertension;male gender     Objective:    There were no vitals filed for this visit. There is no height or weight on file to calculate BMI.     02/23/2022    2:35 PM 01/06/2022    7:51 AM 11/14/2021    3:42 PM 02/05/2020   10:17 AM  Advanced Directives  Does Patient Have a Medical Advance Directive? Yes Yes No Yes  Type of [place]amed[place]of [place] will   [place] will  Does patient want to make changes to medical advance directive? Yes (Inpatient - patient defers changing a medical advance directive and declines information at this time)     Copy of [place] Chart? No - copy requested   No - copy requested  Would patient like information on creating a medical advance directive?   No - Patient declined      Current Medications (verified) Outpatient Encounter Medications as of 02/23/2022  Medication Sig   aspirin 325 MG tablet Take 325 mg by mouth daily.   atorvastatin (LIPITOR) 10 MG tablet TAKE 1 TABLET BY MOUTH EVERYDAY AT BEDTIME   blood glucose meter kit and supplies KIT Dispense based on patient and insurance preference. Use up to four times daily as directed.   Cholecalciferol (VITAMIN D3) 50 MCG (2000 UT) TABS Take 2,000 Units by mouth daily.    lisinopril-hydrochlorothiazide (ZESTORETIC) 20-25 MG tablet Take 1 tablet by mouth daily.   metFORMIN (GLUCOPHAGE) 500 MG tablet Take 1 tablet (500 mg total) by mouth 2 (two) times daily with a meal.   metoprolol succinate (TOPROL-XL) 100 MG 24 hr tablet TAKE 1 TABLET BY MOUTH EVERY DAY WITH OR IMMEDIATELY FOLLOWING A MEAL   Multiple Vitamins-Minerals (PRESERVISION AREDS 2 PO) Take 1 capsule by mouth 2 (two) times daily.    omeprazole (PRILOSEC) 40 MG capsule TAKE 1 CAPSULE BY MOUTH EVERY DAY (Patient taking differently: Take 40 mg by mouth daily.)   tamsulosin (FLOMAX) 0.4 MG CAPS capsule TAKE 1 CAPSULE BY MOUTH EVERY DAY   lisinopril (ZESTRIL) 20 MG tablet TAKE 1 TABLET BY MOUTH EVERY DAY (Patient not taking: Reported on 02/23/2022)   No facility-administered encounter medications on file as of 02/23/2022.    Allergies (verified) Patient has no known allergies.   History: Past Medical History:  Diagnosis Date   Actinic keratosis    Basal cell carcinoma    Cataract    Diabetes mellitus    Dysplastic  nevus 08/13/2009   right medial lower leg above medial ankle, malleolus -severe   Glaucoma    Hyperlipidemia    Hypertension    PVC (premature ventricular contraction)    Past Surgical History:  Procedure Laterality Date   HERNIA REPAIR     ROTATOR CUFF REPAIR     TEE WITHOUT CARDIOVERSION N/A 01/06/2022   Procedure: TRANSESOPHAGEAL ECHOCARDIOGRAM (TEE);  Surgeon: Minna Merritts, MD;  Location: [facility] ORS;  Service: Cardiovascular;   Laterality: N/A;   Family History  Problem Relation Age of Onset   Stroke Mother    Heart attack Father    Alzheimer's disease Maternal Aunt    Heart attack Maternal Uncle    Cancer Maternal Grandmother    Cancer Paternal Grandmother    Social History   Socioeconomic History   Marital status: Married    Spouse name: Not on file   Number of children: 5   Years of education: Not on file   Highest education level: Some college, no degree  Occupational History   Occupation: retired  Tobacco Use   Smoking status: Never   Smokeless tobacco: Former  Scientific laboratory technician Use: Never used  Substance and Sexual Activity   Alcohol use: Not Currently   Drug use: No   Sexual activity: Not on file  Other Topics Concern   Not on file  Social History Narrative   Not on file   Social Determinants of Health   Financial Resource Strain: Low Risk    Difficulty of Paying Living Expenses: Not hard at all  Food Insecurity: No Food Insecurity   Worried About Charity fundraiser in the Last Year: Never true   Arboriculturist in the Last Year: Never true  Transportation Needs: No Transportation Needs   Lack of Transportation (Medical): No   Lack of Transportation (Non-Medical): No  Physical Activity: Insufficiently Active   Days of Exercise per Week: 3 days   Minutes of Exercise per Session: 40 min  Stress: No Stress Concern Present   Feeling of Stress : Not at all  Social Connections: Socially Integrated   Frequency of Communication with Friends and Family: More than three times a week   Frequency of Social Gatherings with Friends and Family: Once a week   Attends Religious Services: More than 4 times per year   Active Member of [employer] or Organizations: Yes   Attends Archivist Meetings: Never   Marital Status: Married    Tobacco Counseling Counseling given: Not Answered   Clinical Intake:  Pre-visit preparation completed: Yes  Pain : No/denies pain     Diabetes:  Yes CBG done?: No Did pt. bring in CBG monitor from home?: No  How often do you need to have someone help you when you read instructions, pamphlets, or other written materials from your doctor or pharmacy?: 1 - Never  Diabetic?yes Nutrition Risk Assessment:  Has the patient had any N/V/D within the last 2 months?  No  Does the patient have any non-healing wounds?  No  Has the patient had any unintentional weight loss or weight gain?  No   Diabetes:  Is the patient diabetic?  Yes  If diabetic, was a CBG obtained today?  No  Did the patient bring in their glucometer from home?  No  How often do you monitor your CBG's? Twice per week.   Financial Strains and Diabetes Management:  Are you having any financial strains with the device, your  supplies or your medication? No .  Does the patient want to be seen by Chronic Care Management for management of their diabetes?  No  Would the patient like to be referred to a Nutritionist or for Diabetic Management?  No   Diabetic Exams:  Diabetic Eye Exam: Completed 12/26/21.   Diabetic Foot Exam: Completed 08/12/21. Pt has been advised about the importance in completing this exam.    Interpreter Needed?: No  Information entered by :: Kirke Shaggy, LPN   Activities of Daily Living    02/23/2022    2:37 PM 01/06/2022    7:47 AM  In your present state of health, do you have any difficulty performing the following activities:  Hearing? 0 0  Vision? 0 0  Difficulty concentrating or making decisions? 0   Walking or climbing stairs? 0 0  Dressing or bathing? 0 0  Doing errands, shopping? 0   Preparing Food and eating ? N   Using the Toilet? N   In the past six months, have you accidently leaked urine? N   Do you have problems with loss of bowel control? N   Managing your Medications? N   Managing your Finances? N   Housekeeping or managing your Housekeeping? N     Patient Care Team: Mikey Kirschner, PA-C as PCP - General (Physician  Assistant) End, Harrell Gave, MD as PCP - Cardiology (Cardiology) Vickie Epley, MD as PCP - Electrophysiology (Cardiology) Pa, [facility]) Ralene Bathe, MD (Dermatology)  Indicate any recent Medical Services you may have received from other than [facility] providers in the past year (date may be approximate).     Assessment:   This is a routine wellness examination for [place].  Hearing/Vision screen No results found.  Dietary issues and exercise activities discussed: Current Exercise Habits: Home exercise routine, Type of exercise: walking, Time (Minutes): 40, Frequency (Times/Week): 3, Weekly Exercise (Minutes/Week): 120, Intensity: Mild   Goals Addressed             This Visit's Progress    DIET - EAT MORE FRUITS AND VEGETABLES         Depression Screen    02/23/2022    2:33 PM 10/06/2021    3:43 PM 08/12/2021    8:24 AM 02/18/2021    9:56 AM 02/06/2020    9:55 AM 02/05/2020   10:14 AM 02/06/2019    9:10 AM  PHQ 2/9 Scores  PHQ - 2 Score 0 0 0 0 0 0 0  PHQ- 9 Score 0 0 0 0 0      Fall Risk    02/23/2022    2:37 PM 10/06/2021    3:42 PM 08/12/2021    8:24 AM 02/18/2021    9:56 AM 02/05/2020   10:17 AM  Fall Risk   Falls in the past year? 0 0 0 0 0  Number falls in past yr: 0 0 0 0 0  Injury with Fall? 0 0 0 0 0  Risk for fall due to : No Fall Risks No Fall Risks No Fall Risks    Follow up Falls evaluation completed   Falls evaluation completed     FALL RISK PREVENTION PERTAINING TO THE HOME:  Any stairs in or around the home? No  If so, are there any without handrails? No  Home free of loose throw rugs in walkways, pet beds, electrical cords, etc? Yes  Adequate lighting in your home to reduce risk of falls? Yes  ASSISTIVE DEVICES UTILIZED TO PREVENT FALLS:  Life alert? No  Use of a cane, walker or w/c? No  Grab bars in the bathroom? Yes  Shower chair or bench in shower? Yes  Elevated toilet seat or a handicapped toilet? Yes     Cognitive Function:        02/23/2022    2:40 PM 02/18/2021    9:57 AM 02/05/2020   10:20 AM  6CIT Screen  What Year? 0 points 0 points 0 points  What month? 0 points 0 points 0 points  What time? 0 points 0 points 0 points  Count back from 20 0 points 0 points 0 points  Months in reverse 0 points 0 points 0 points  Repeat phrase 0 points 0 points 0 points  Total Score 0 points 0 points 0 points    Immunizations Immunization History  Administered Date(s) Administered   Fluad Quad(high Dose 65+) 08/11/2020, 08/12/2021   PFIZER(Purple Top)SARS-COV-2 Vaccination 10/15/2019, 11/05/2019   Tdap 06/18/2012    TDAP status: Up to date  Flu Vaccine status: Up to date  Pneumococcal vaccine status: Declined,  Education has been provided regarding the importance of this vaccine but patient still declined. Advised may receive this vaccine at local pharmacy or Health Dept. Aware to provide a copy of the vaccination record if obtained from local pharmacy or Health Dept. Verbalized acceptance and understanding.   Covid-19 vaccine status: Completed vaccines  Qualifies for Shingles Vaccine? Yes   Zostavax completed No   Shingrix Completed?: No.    Education has been provided regarding the importance of this vaccine. Patient has been advised to call insurance company to determine out of pocket expense if they have not yet received this vaccine. Advised may also receive vaccine at local pharmacy or Health Dept. Verbalized acceptance and understanding.  Screening Tests Health Maintenance  Topic Date Due   Zoster Vaccines- Shingrix (1 of 2) Never done   Pneumonia Vaccine 39+ Years old (1 - PCV) Never done   COVID-19 Vaccine (3 - Pfizer risk series) 02/25/2022 (Originally 12/03/2019)   INFLUENZA VACCINE  04/25/2022   TETANUS/TDAP  06/18/2022   FOOT EXAM  08/12/2022   HEMOGLOBIN A1C  08/12/2022   OPHTHALMOLOGY EXAM  12/27/2022   HPV VACCINES  Aged Out    Health Maintenance  Health  Maintenance Due  Topic Date Due   Zoster Vaccines- Shingrix (1 of 2) Never done   Pneumonia Vaccine 29+ Years old (1 - PCV) Never done    Colorectal cancer screening: No longer required.   Lung Cancer Screening: (Low Dose CT Chest recommended if Age 34-80 years, 30 pack-year currently smoking OR have quit w/in 15years.) does not qualify.   Additional Screening:  Hepatitis C Screening: does not qualify; Completed no  Vision Screening: Recommended annual ophthalmology exams for early detection of glaucoma and other disorders of the eye. Is the patient up to date with their annual eye exam?  Yes  Who is the provider or what is the name of the office in which the patient attends annual eye exams? [facility] If pt is not established with a provider, would they like to be referred to a provider to establish care? No .   Dental Screening: Recommended annual dental exams for proper oral hygiene  Community Resource Referral / Chronic Care Management: CRR required this visit?  No   CCM required this visit?  No      Plan:     I have personally reviewed and  noted the following in the patient's chart:   Medical and social history Use of alcohol, tobacco or illicit drugs  Current medications and supplements including opioid prescriptions. Patient is not currently taking opioid prescriptions. Functional ability and status Nutritional status Physical activity Advanced directives List of other physicians Hospitalizations, surgeries, and ER visits in previous 12 months Vitals Screenings to include cognitive, depression, and falls Referrals and appointments  In addition, I have reviewed and discussed with patient certain preventive protocols, quality metrics, and best practice recommendations. A written personalized care plan for preventive services as well as general preventive health recommendations were provided to patient.     Dionisio David, LPN   05/27/1193   Nurse  Notes: none

## 2022-02-23 NOTE — Patient Instructions (Signed)
[name] , Thank you for taking time to come for your Medicare Wellness Visit. I appreciate your ongoing commitment to your health goals. Please review the following plan we discussed and let me know if I can assist you in the future.   Screening recommendations/referrals: Colonoscopy: aged out Recommended yearly ophthalmology/optometry visit for glaucoma screening and checkup Recommended yearly dental visit for hygiene and checkup  Vaccinations: Influenza vaccine: 08/12/21 Pneumococcal vaccine: n/d Tdap vaccine: 06/18/12 Shingles vaccine: n/d   Covid-19: 10/15/19, 11/05/19  Advanced directives: yes, copy needed  Conditions/risks identified: no  Next appointment: Follow up in one year for your annual wellness visit. 02/26/23 @ 2:30 pm by phone  Preventive Care 81 Years and Older, Male Preventive care refers to lifestyle choices and visits with your health care provider that can promote health and wellness. What does preventive care include? A yearly physical exam. This is also called an annual well check. Dental exams once or twice a year. Routine eye exams. Ask your health care provider how often you should have your eyes checked. Personal lifestyle choices, including: Daily care of your teeth and gums. Regular physical activity. Eating a healthy diet. Avoiding tobacco and drug use. Limiting alcohol use. Practicing safe sex. Taking low doses of aspirin every day. Taking vitamin and mineral supplements as recommended by your health care provider. What happens during an annual well check? The services and screenings done by your health care provider during your annual well check will depend on your age, overall health, lifestyle risk factors, and family history of disease. Counseling  Your health care provider may ask you questions about your: Alcohol use. Tobacco use. Drug use. Emotional well-being. Home and relationship well-being. Sexual activity. Eating habits. History  of falls. Memory and ability to understand (cognition). Work and work Statistician. Screening  You may have the following tests or measurements: Height, weight, and BMI. Blood pressure. Lipid and cholesterol levels. These may be checked every 5 years, or more frequently if you are over 64 years old. Skin check. Lung cancer screening. You may have this screening every year starting at age 81 if you have a 30-pack-year history of smoking and currently smoke or have quit within the past 15 years. Fecal occult blood test (FOBT) of the stool. You may have this test every year starting at age 81. Flexible sigmoidoscopy or colonoscopy. You may have a sigmoidoscopy every 5 years or a colonoscopy every 10 years starting at age 81. Prostate cancer screening. Recommendations will vary depending on your family history and other risks. Hepatitis C blood test. Hepatitis B blood test. Sexually transmitted disease (STD) testing. Diabetes screening. This is done by checking your blood sugar (glucose) after you have not eaten for a while (fasting). You may have this done every 1-3 years. Abdominal aortic aneurysm (AAA) screening. You may need this if you are a current or former smoker. Osteoporosis. You may be screened starting at age 81 if you are at high risk. Talk with your health care provider about your test results, treatment options, and if necessary, the need for more tests. Vaccines  Your health care provider may recommend certain vaccines, such as: Influenza vaccine. This is recommended every year. Tetanus, diphtheria, and acellular pertussis (Tdap, Td) vaccine. You may need a Td booster every 10 years. Zoster vaccine. You may need this after age 81. Pneumococcal 13-valent conjugate (PCV13) vaccine. One dose is recommended after age 60. Pneumococcal polysaccharide (PPSV23) vaccine. One dose is recommended after age 60. Talk to your  health care provider about which screenings and vaccines you need  and how often you need them. This information is not intended to replace advice given to you by your health care provider. Make sure you discuss any questions you have with your health care provider. Document Released: 10/08/2015 Document Revised: 05/31/2016 Document Reviewed: 07/13/2015 Elsevier Interactive Patient Education  2017 [place] Prevention in the Home Falls can cause injuries. They can happen to people of all ages. There are many things you can do to make your home safe and to help prevent falls. What can I do on the outside of my home? Regularly fix the edges of walkways and driveways and fix any cracks. Remove anything that might make you trip as you walk through a door, such as a raised step or threshold. Trim any bushes or trees on the path to your home. Use bright outdoor lighting. Clear any walking paths of anything that might make someone trip, such as rocks or tools. Regularly check to see if handrails are loose or broken. Make sure that both sides of any steps have handrails. Any raised decks and porches should have guardrails on the edges. Have any leaves, snow, or ice cleared regularly. Use sand or salt on walking paths during winter. Clean up any spills in your garage right away. This includes oil or grease spills. What can I do in the bathroom? Use night lights. Install grab bars by the toilet and in the tub and shower. Do not use towel bars as grab bars. Use non-skid mats or decals in the tub or shower. If you need to sit down in the shower, use a plastic, non-slip stool. Keep the floor dry. Clean up any water that spills on the floor as soon as it happens. Remove soap buildup in the tub or shower regularly. Attach bath mats securely with double-sided non-slip rug tape. Do not have throw rugs and other things on the floor that can make you trip. What can I do in the bedroom? Use night lights. Make sure that you have a light by your bed that is easy  to reach. Do not use any sheets or blankets that are too big for your bed. They should not hang down onto the floor. Have a firm chair that has side arms. You can use this for support while you get dressed. Do not have throw rugs and other things on the floor that can make you trip. What can I do in the kitchen? Clean up any spills right away. Avoid walking on wet floors. Keep items that you use a lot in easy-to-reach places. If you need to reach something above you, use a strong step stool that has a grab bar. Keep electrical cords out of the way. Do not use floor polish or wax that makes floors slippery. If you must use wax, use non-skid floor wax. Do not have throw rugs and other things on the floor that can make you trip. What can I do with my stairs? Do not leave any items on the stairs. Make sure that there are handrails on both sides of the stairs and use them. Fix handrails that are broken or loose. Make sure that handrails are as long as the stairways. Check any carpeting to make sure that it is firmly attached to the stairs. Fix any carpet that is loose or worn. Avoid having throw rugs at the top or bottom of the stairs. If you do have throw rugs, attach them  to the floor with carpet tape. Make sure that you have a light switch at the top of the stairs and the bottom of the stairs. If you do not have them, ask someone to add them for you. What else can I do to help prevent falls? Wear shoes that: Do not have high heels. Have rubber bottoms. Are comfortable and fit you well. Are closed at the toe. Do not wear sandals. If you use a stepladder: Make sure that it is fully opened. Do not climb a closed stepladder. Make sure that both sides of the stepladder are locked into place. Ask someone to hold it for you, if possible. Clearly mark and make sure that you can see: Any grab bars or handrails. First and last steps. Where the edge of each step is. Use tools that help you move  around (mobility aids) if they are needed. These include: Canes. Walkers. Scooters. Crutches. Turn on the lights when you go into a dark area. Replace any light bulbs as soon as they burn out. Set up your furniture so you have a clear path. Avoid moving your furniture around. If any of your floors are uneven, fix them. If there are any pets around you, be aware of where they are. Review your medicines with your doctor. Some medicines can make you feel dizzy. This can increase your chance of falling. Ask your doctor what other things that you can do to help prevent falls. This information is not intended to replace advice given to you by your health care provider. Make sure you discuss any questions you have with your health care provider. Document Released: 07/08/2009 Document Revised: 02/17/2016 Document Reviewed: 10/16/2014 Elsevier Interactive Patient Education  2017 [employer].

## 2022-03-07 ENCOUNTER — Other Ambulatory Visit: Payer: Self-pay | Admitting: Physician Assistant

## 2022-03-07 DIAGNOSIS — E119 Type 2 diabetes mellitus without complications: Secondary | ICD-10-CM

## 2022-03-09 ENCOUNTER — Ambulatory Visit: Payer: 59 | Admitting: Physician Assistant
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
## 2022-03-14 ENCOUNTER — Encounter: Payer: Self-pay | Admitting: Physician Assistant

## 2022-03-14 ENCOUNTER — Ambulatory Visit (INDEPENDENT_AMBULATORY_CARE_PROVIDER_SITE_OTHER): Payer: Medicare Other | Admitting: Physician Assistant

## 2022-03-14 VITALS — BP 146/84 | HR 56 | Ht 70.0 in | Wt 192.0 lb

## 2022-03-14 DIAGNOSIS — I471 Supraventricular tachycardia, unspecified: Secondary | ICD-10-CM

## 2022-03-14 DIAGNOSIS — Z87898 Personal history of other specified conditions: Secondary | ICD-10-CM

## 2022-03-14 DIAGNOSIS — I10 Essential (primary) hypertension: Secondary | ICD-10-CM | POA: Diagnosis not present

## 2022-03-14 DIAGNOSIS — I493 Ventricular premature depolarization: Secondary | ICD-10-CM

## 2022-03-14 DIAGNOSIS — R002 Palpitations: Secondary | ICD-10-CM | POA: Diagnosis not present

## 2022-03-14 DIAGNOSIS — E785 Hyperlipidemia, unspecified: Secondary | ICD-10-CM

## 2022-03-14 DIAGNOSIS — I2584 Coronary atherosclerosis due to calcified coronary lesion: Secondary | ICD-10-CM

## 2022-03-14 DIAGNOSIS — I2510 Atherosclerotic heart disease of native coronary artery without angina pectoris: Secondary | ICD-10-CM

## 2022-03-14 NOTE — Patient Instructions (Signed)
Medication Instructions:  ?- Your physician recommends that you continue on your current medications as directed. Please refer to the Current Medication list given to you today. ? ?*If you need a refill on your cardiac medications before your next appointment, please call your pharmacy* ? ? ?Lab Work: ?- none ordered ? ?If you have labs (blood work) drawn today and your tests are completely normal, you will receive your results only by: ?MyChart Message (if you have MyChart) OR ?A paper copy in the mail ?If you have any lab test that is abnormal or we need to change your treatment, we will call you to review the results. ? ? ?Testing/Procedures: ?- none ordered ? ? ?Follow-Up: ?At [facility], you and your health needs are our priority.  As part of our continuing mission to provide you with exceptional heart care, we have created designated Provider Care Teams.  These Care Teams include your primary Cardiologist (physician) and Advanced Practice Providers (APPs -  Physician Assistants and Nurse Practitioners) who all work together to provide you with the care you need, when you need it. ? ?We recommend signing up for the patient portal called "MyChart".  Sign up information is provided on this After Visit Summary.  MyChart is used to connect with patients for Virtual Visits (Telemedicine).  Patients are able to view lab/test results, encounter notes, upcoming appointments, etc.  Non-urgent messages can be sent to your provider as well.   ?To learn more about what you can do with MyChart, go to [email or web address].   ? ?Your next appointment:   ?6 month(s) ? ?The format for your next appointment:   ?In Person ? ?Provider:   ?You may see Christopher End, MD or one of the following Advanced Practice Providers on your designated Care Team:   ? ?Ryan Dunn, PA-C ?  ? ? ?Other Instructions ?N/a ? ?Important Information About Sugar ? ? ? ? ? ? ?

## 2022-04-05 ENCOUNTER — Other Ambulatory Visit: Payer: Self-pay | Admitting: Physician Assistant

## 2022-04-05 DIAGNOSIS — R0989 Other specified symptoms and signs involving the circulatory and respiratory systems: Secondary | ICD-10-CM

## 2022-04-05 NOTE — Telephone Encounter (Signed)
Requested Prescriptions  Pending Prescriptions Disp Refills  . omeprazole (PRILOSEC) 40 MG capsule [Pharmacy Med Name: OMEPRAZOLE DR 40 MG CAPSULE] 90 capsule 1    Sig: TAKE 1 CAPSULE BY MOUTH EVERY DAY     Gastroenterology: Proton Pump Inhibitors Passed - 04/05/2022  3:40 AM      Passed - Valid encounter within last 12 months    Recent Outpatient Visits          1 month ago Controlled type 2 diabetes mellitus without complication, without long-term current use of insulin ([place])   [facility] Thedore Mins, [place], PA-C   4 months ago SVT (supraventricular tachycardia) ([place])   [facility] Thedore Mins, [place], PA-C   6 months ago Acute non-recurrent frontal sinusitis   [facility] Thedore Mins, [place], PA-C   7 months ago Controlled type 2 diabetes mellitus without complication, without long-term current use of insulin ([place])   [facility] Thedore Mins, [place], PA-C   1 year ago Type 2 diabetes mellitus with diabetic cataract, without long-term current use of insulin [facility])   [facility] Birdie Sons, MD      Future Appointments            In 1 month Ralene Bathe, MD [place]   In 4 months Thedore Mins, Ria Comment, PA-C [facility], [place]   In 5 months End, Harrell Gave, MD [facility], [place]

## 2022-04-11 ENCOUNTER — Other Ambulatory Visit: Payer: Self-pay | Admitting: Physician Assistant

## 2022-04-11 DIAGNOSIS — E119 Type 2 diabetes mellitus without complications: Secondary | ICD-10-CM

## 2022-04-13 ENCOUNTER — Encounter: Payer: Self-pay | Admitting: Physician Assistant

## 2022-04-13 ENCOUNTER — Ambulatory Visit (INDEPENDENT_AMBULATORY_CARE_PROVIDER_SITE_OTHER): Payer: Medicare Other | Admitting: Physician Assistant

## 2022-04-13 VITALS — BP 161/90 | HR 55 | Temp 97.5°F | Resp 16 | Ht 70.0 in | Wt 193.9 lb

## 2022-04-13 DIAGNOSIS — R319 Hematuria, unspecified: Secondary | ICD-10-CM | POA: Diagnosis not present

## 2022-04-13 DIAGNOSIS — M546 Pain in thoracic spine: Secondary | ICD-10-CM

## 2022-04-13 DIAGNOSIS — I152 Hypertension secondary to endocrine disorders: Secondary | ICD-10-CM

## 2022-04-13 DIAGNOSIS — E119 Type 2 diabetes mellitus without complications: Secondary | ICD-10-CM

## 2022-04-13 DIAGNOSIS — E1159 Type 2 diabetes mellitus with other circulatory complications: Secondary | ICD-10-CM

## 2022-04-13 LAB — POCT URINALYSIS DIPSTICK
Bilirubin, UA: NEGATIVE
Glucose, UA: NEGATIVE
Ketones, UA: NEGATIVE
Leukocytes, UA: NEGATIVE
Nitrite, UA: NEGATIVE
Protein, UA: NEGATIVE
Spec Grav, UA: 1.025 (ref 1.010–1.025)
Urobilinogen, UA: 0.2 E.U./dL
pH, UA: 6 (ref 5.0–8.0)

## 2022-04-13 MED ORDER — BLOOD GLUCOSE MONITOR KIT: PACK | 0 refills | Status: DC

## 2022-04-13 NOTE — Assessment & Plan Note (Signed)
Needs a new monitor.

## 2022-04-13 NOTE — Assessment & Plan Note (Addendum)
Elevated in office. Last cardio visit they did not want to make changes secondary to other symptoms of heart palpitations . Advised pt to check pressure at home consistently.  Will will eval next visit.

## 2022-04-13 NOTE — Progress Notes (Signed)
Established patient visit  I,Joseline E Rosas,acting as a scribe for [employer], PA-C.,have documented all relevant documentation on the behalf of Mikey Kirschner, PA-C,as directed by  Mikey Kirschner, PA-C while in the presence of Mikey Kirschner, PA-C.   Patient: Joseph Cook   DOB: 01-11-1941   81 y.o. Male  MRN: [id number] Visit Date: 04/13/2022  Today's healthcare provider: Mikey Kirschner, PA-C   Chief Complaint  Patient presents with   Back Pain   Subjective    HPI  Back Pain  He reports recurrent back pain. There was not an injury that may have caused the pain. The most recent episode started about a week ago and is gradually improving. The pain is located in the right sidewithout radiation. It is described as stabbing, is mild, moderate, and severe in intensity, occurring every day. Symptoms are worse in the: it can happen at anytime, specially with movement. Pain comes suddenly doesn't matter what he is doing. Aggravating factors: movement  Relieving factors: none.  He has tried nothing.  Associated symptoms: No abdominal pain No bowel incontinence  No chest pain No dysuria   No fever No headaches  No joint pains No pelvic pain  No weakness in leg  No tingling in lower extremities  No urinary incontinence No weight loss   -----------------------------------------------------------------------------------------     Medications: Outpatient Medications Prior to Visit  Medication Sig   aspirin 325 MG tablet Take 325 mg by mouth daily.   atorvastatin (LIPITOR) 10 MG tablet TAKE 1 TABLET BY MOUTH EVERYDAY AT BEDTIME   blood glucose meter kit and supplies KIT Dispense based on patient and insurance preference. Use up to four times daily as directed.   Cholecalciferol (VITAMIN D3) 50 MCG (2000 UT) TABS Take 2,000 Units by mouth daily.    Lancets (ONETOUCH DELICA PLUS [id number]) MISC USE TO TEST BLOOD SUGAR AT LEAST ONCE DAILY   lisinopril-hydrochlorothiazide  (ZESTORETIC) 20-25 MG tablet Take 1 tablet by mouth daily.   metFORMIN (GLUCOPHAGE) 500 MG tablet Take 1 tablet (500 mg total) by mouth 2 (two) times daily with a meal.   metoprolol succinate (TOPROL-XL) 100 MG 24 hr tablet TAKE 1 TABLET BY MOUTH EVERY DAY WITH OR IMMEDIATELY FOLLOWING A MEAL   Multiple Vitamins-Minerals (PRESERVISION AREDS 2 PO) Take 1 capsule by mouth 2 (two) times daily.    omeprazole (PRILOSEC) 40 MG capsule TAKE 1 CAPSULE BY MOUTH EVERY DAY   ONETOUCH ULTRA test strip USE TO TEST BLOOD SUGAR UP TO 4 TIMES A DAY   tamsulosin (FLOMAX) 0.4 MG CAPS capsule TAKE 1 CAPSULE BY MOUTH EVERY DAY   No facility-administered medications prior to visit.    Review of Systems  Constitutional:  Negative for fatigue and fever.  Respiratory:  Negative for cough and shortness of breath.   Cardiovascular:  Negative for chest pain, palpitations and leg swelling.  Genitourinary:  Positive for flank pain.  Neurological:  Negative for dizziness and headaches.       Objective    BP (!) 157/94 (BP Location: Right Arm, Patient Position: Sitting, Cuff Size: Large)   Pulse (!) 55   Temp (!) 97.5 F (36.4 C) (Oral)   Resp 16   Ht '5\' 10"'  (1.778 m)   Wt 193 lb 14.4 oz (88 kg)   SpO2 99%   BMI 27.82 kg/m   Physical Exam Constitutional:      General: He is awake.     Appearance: He is well-developed.  HENT:  Head: Normocephalic.  Eyes:     Conjunctiva/sclera: Conjunctivae normal.  Cardiovascular:     Rate and Rhythm: Normal rate and regular rhythm.     Heart sounds: Murmur heard.  Pulmonary:     Effort: Pulmonary effort is normal.     Breath sounds: Normal breath sounds.  Musculoskeletal:     Thoracic back: Swelling and spasms present. No tenderness or bony tenderness.  Skin:    General: Skin is warm.  Neurological:     Mental Status: He is alert and oriented to person, place, and time.  Psychiatric:        Attention and Perception: Attention normal.        Mood and  Affect: Mood normal.        Speech: Speech normal.        Behavior: Behavior is cooperative.      Results for orders placed or performed in visit on 04/13/22  POCT urinalysis dipstick  Result Value Ref Range   Color, UA Yellow    Clarity, UA Clear    Glucose, UA Negative Negative   Bilirubin, UA Negative    Ketones, UA Negative    Spec Grav, UA 1.025 1.010 - 1.025   Blood, UA Trace    pH, UA 6.0 5.0 - 8.0   Protein, UA Negative Negative   Urobilinogen, UA 0.2 0.2 or 1.0 E.U./dL   Nitrite, UA Negative    Leukocytes, UA Negative Negative   Appearance     Odor      Assessment & Plan     Problem List Items Addressed This Visit       Cardiovascular and Mediastinum   Hypertension associated with diabetes ([place])    Elevated in office. Last cardio visit they did not want to make changes secondary to other symptoms of heart palpitations . Advised pt to check pressure at home consistently.  Will will eval next visit.          Endocrine   Diabetes mellitus type II, controlled ([place])    Needs a new monitor.       Relevant Medications   blood glucose meter kit and supplies KIT     Other   Acute right-sided thoracic back pain - Primary    Will r/o UTI/pyleo but low suspicion  Advised rest, stretches, topical heat.        Relevant Orders   POCT urinalysis dipstick (Completed)   Hematuria    Trace hematuria Given back pain, hx DM will send culture and micro      Relevant Orders   Urine Microscopic   Urine Culture      I, Mikey Kirschner, PA-C have reviewed all documentation for this visit. The documentation on  04/13/2022 for the exam, diagnosis, procedures, and orders are all accurate and complete.  Mikey Kirschner, PA-C [facility] [address] [place], [place], [address] Office: [phone] Fax: [place]

## 2022-04-13 NOTE — Assessment & Plan Note (Signed)
Will r/o UTI/pyleo but low suspicion  Advised rest, stretches, topical heat.

## 2022-04-13 NOTE — Assessment & Plan Note (Signed)
>>  ASSESSMENT AND PLAN FOR DIABETES MELLITUS TYPE II, CONTROLLED (HCC) WRITTEN ON 04/13/2022 10:49 AM BY DRUBEL, LINDSAY, PA-C  Needs a new monitor.

## 2022-04-13 NOTE — Assessment & Plan Note (Signed)
Trace hematuria Given back pain, hx DM will send culture and micro

## 2022-04-14 ENCOUNTER — Other Ambulatory Visit: Payer: Self-pay | Admitting: Physician Assistant

## 2022-04-14 DIAGNOSIS — E119 Type 2 diabetes mellitus without complications: Secondary | ICD-10-CM

## 2022-04-14 LAB — URINALYSIS, MICROSCOPIC ONLY
Bacteria, UA: NONE SEEN
Casts: NONE SEEN /lpf
Epithelial Cells (non renal): NONE SEEN /hpf (ref 0–10)
RBC, Urine: NONE SEEN /hpf (ref 0–2)
WBC, UA: NONE SEEN /hpf (ref 0–5)

## 2022-04-14 LAB — SPECIMEN STATUS REPORT

## 2022-04-14 NOTE — Telephone Encounter (Signed)
Per pharmacy: Pharmacy comment: Alternative Requested:MEDICARE WILL ONLY COVER TESTING ONCE A DAY PLEASE SEND IN NEW RX WITH SIG TO TEST ONCE A DAY FOR THIS RX TO GO THROUGH ON INSURANCE. DIAGNOSIS CODE NEEDS TO BE STATED ON NEW RX AS WELL.

## 2022-04-15 LAB — SPECIMEN STATUS REPORT

## 2022-04-15 LAB — URINE CULTURE: Organism ID, Bacteria: NO GROWTH

## 2022-05-07 ENCOUNTER — Other Ambulatory Visit: Payer: Self-pay | Admitting: Physician Assistant

## 2022-05-07 DIAGNOSIS — I10 Essential (primary) hypertension: Secondary | ICD-10-CM

## 2022-05-07 DIAGNOSIS — E1136 Type 2 diabetes mellitus with diabetic cataract: Secondary | ICD-10-CM

## 2022-05-12 ENCOUNTER — Ambulatory Visit: Payer: Medicare Other | Admitting: Physician Assistant

## 2022-05-18 ENCOUNTER — Ambulatory Visit (INDEPENDENT_AMBULATORY_CARE_PROVIDER_SITE_OTHER): Payer: Medicare Other | Admitting: Dermatology

## 2022-05-18 DIAGNOSIS — L814 Other melanin hyperpigmentation: Secondary | ICD-10-CM

## 2022-05-18 DIAGNOSIS — Z86018 Personal history of other benign neoplasm: Secondary | ICD-10-CM

## 2022-05-18 DIAGNOSIS — W57XXXA Bitten or stung by nonvenomous insect and other nonvenomous arthropods, initial encounter: Secondary | ICD-10-CM

## 2022-05-18 DIAGNOSIS — S80861A Insect bite (nonvenomous), right lower leg, initial encounter: Secondary | ICD-10-CM

## 2022-05-18 DIAGNOSIS — Z1283 Encounter for screening for malignant neoplasm of skin: Secondary | ICD-10-CM

## 2022-05-18 DIAGNOSIS — I2510 Atherosclerotic heart disease of native coronary artery without angina pectoris: Secondary | ICD-10-CM

## 2022-05-18 DIAGNOSIS — L570 Actinic keratosis: Secondary | ICD-10-CM | POA: Diagnosis not present

## 2022-05-18 DIAGNOSIS — L821 Other seborrheic keratosis: Secondary | ICD-10-CM

## 2022-05-18 DIAGNOSIS — D1800 Hemangioma unspecified site: Secondary | ICD-10-CM

## 2022-05-18 DIAGNOSIS — Z85828 Personal history of other malignant neoplasm of skin: Secondary | ICD-10-CM

## 2022-05-18 DIAGNOSIS — I2584 Coronary atherosclerosis due to calcified coronary lesion: Secondary | ICD-10-CM

## 2022-05-18 DIAGNOSIS — D229 Melanocytic nevi, unspecified: Secondary | ICD-10-CM

## 2022-05-18 DIAGNOSIS — L820 Inflamed seborrheic keratosis: Secondary | ICD-10-CM

## 2022-05-18 DIAGNOSIS — L578 Other skin changes due to chronic exposure to nonionizing radiation: Secondary | ICD-10-CM

## 2022-05-18 MED ORDER — MUPIROCIN 2 % EX OINT: TOPICAL_OINTMENT | CUTANEOUS | 0 refills | Status: DC

## 2022-05-18 NOTE — Progress Notes (Signed)
Follow-Up Visit   Subjective  Joseph Cook is a 81 y.o. male who presents for the following: Annual Exam. Hx of BCC, hx of Dysplastic nevus, hx of Aks. Check a bite area on the right leg appeared 1 week ago, The patient presents for Total-Body Skin Exam (TBSE) for skin cancer screening and mole check.  The patient has spots, moles and lesions to be evaluated, some may be new or changing and the patient has concerns that these could be cancer.   The following portions of the chart were reviewed this encounter and updated as appropriate:   Tobacco  Allergies  Meds  Problems  Med Hx  Surg Hx  Fam Hx     Review of Systems:  No other skin or systemic complaints except as noted in HPI or Assessment and Plan.  Objective  Well appearing patient in no apparent distress; mood and affect are within normal limits.  A full examination was performed including scalp, head, eyes, ears, nose, lips, neck, chest, axillae, abdomen, back, buttocks, bilateral upper extremities, bilateral lower extremities, hands, feet, fingers, toes, fingernails, and toenails. All findings within normal limits unless otherwise noted below.  face,scalp, ear x 11 (11) Erythematous thin papules/macules with gritty scale.   Scalp x 1 Stuck-on, waxy, tan-brown papule -- Discussed benign etiology and prognosis.   right lower leg Crusted patch   Assessment & Plan  AK (actinic keratosis) (11) face,scalp, ear x 11 Actinic keratoses are precancerous spots that appear secondary to cumulative UV radiation exposure/sun exposure over time. They are chronic with expected duration over 1 year. A portion of actinic keratoses will progress to squamous cell carcinoma of the skin. It is not possible to reliably predict which spots will progress to skin cancer and so treatment is recommended to prevent development of skin cancer.  Recommend daily broad spectrum sunscreen SPF 30+ to sun-exposed areas, reapply every 2 hours as needed.   Recommend staying in the shade or wearing long sleeves, sun glasses (UVA+UVB protection) and wide brim hats (4-inch brim around the entire circumference of the hat). Call for new or changing lesions.   Destruction of lesion - face,scalp, ear x 11 Complexity: simple   Destruction method: cryotherapy   Informed consent: discussed and consent obtained   Timeout:  patient name, date of birth, surgical site, and procedure verified Lesion destroyed using liquid nitrogen: Yes   Region frozen until ice ball extended beyond lesion: Yes   Outcome: patient tolerated procedure well with no complications   Post-procedure details: wound care instructions given    Inflamed seborrheic keratosis Scalp x 1 Symptomatic, irritating, patient would like treated.  Destruction of lesion - Scalp x 1 Complexity: simple   Destruction method: cryotherapy   Informed consent: discussed and consent obtained   Timeout:  patient name, date of birth, surgical site, and procedure verified Lesion destroyed using liquid nitrogen: Yes   Region frozen until ice ball extended beyond lesion: Yes   Outcome: patient tolerated procedure well with no complications   Post-procedure details: wound care instructions given    Bug bite without infection, initial encounter right lower leg Start Mupirocin ointment apply to affected skin qd-bid prn   Related Medications mupirocin ointment (BACTROBAN) 2 % Apply to skin qd-bid  Lentigines - Scattered tan macules - Due to sun exposure - Benign-appearing, observe - Recommend daily broad spectrum sunscreen SPF 30+ to sun-exposed areas, reapply every 2 hours as needed. - Call for any changes  Seborrheic Keratoses - Stuck-on,  waxy, tan-brown papules and/or plaques  - Benign-appearing - Discussed benign etiology and prognosis. - Observe - Call for any changes  Melanocytic Nevi - Tan-brown and/or pink-flesh-colored symmetric macules and papules - Benign appearing on exam  today - Observation - Call clinic for new or changing moles - Recommend daily use of broad spectrum spf 30+ sunscreen to sun-exposed areas.   Hemangiomas - Red papules - Discussed benign nature - Observe - Call for any changes  Actinic Damage - Chronic condition, secondary to cumulative UV/sun exposure - diffuse scaly erythematous macules with underlying dyspigmentation - Recommend daily broad spectrum sunscreen SPF 30+ to sun-exposed areas, reapply every 2 hours as needed.  - Staying in the shade or wearing long sleeves, sun glasses (UVA+UVB protection) and wide brim hats (4-inch brim around the entire circumference of the hat) are also recommended for sun protection.  - Call for new or changing lesions.  History of Dysplastic Nevi Right medial lower leg above medial ankle 2010 - No evidence of recurrence today - Recommend regular full body skin exams - Recommend daily broad spectrum sunscreen SPF 30+ to sun-exposed areas, reapply every 2 hours as needed.  - Call if any new or changing lesions are noted between office visits   History of Basal Cell Carcinoma of the Skin - No evidence of recurrence today - Recommend regular full body skin exams - Recommend daily broad spectrum sunscreen SPF 30+ to sun-exposed areas, reapply every 2 hours as needed.  - Call if any new or changing lesions are noted between office visits   Skin cancer screening performed today.   Return in about 1 year (around 05/19/2023) for TBSE, hx of BCC, hx of Dysplastic nevus .  IMarye Round, CMA, am acting as scribe for Sarina Ser, MD .  Documentation: I have reviewed the above documentation for accuracy and completeness, and I agree with the above.  Sarina Ser, MD

## 2022-05-18 NOTE — Patient Instructions (Addendum)
Cryotherapy Aftercare  Wash gently with soap and water everyday.   Apply Vaseline and Band-Aid daily until healed.     Due to recent changes in healthcare laws, you may see results of your pathology and/or laboratory studies on MyChart before the doctors have had a chance to review them. We understand that in some cases there may be results that are confusing or concerning to you. Please understand that not all results are received at the same time and often the doctors may need to interpret multiple results in order to provide you with the best plan of care or course of treatment. Therefore, we ask that you please give us 2 business days to thoroughly review all your results before contacting the office for clarification. Should we see a critical lab result, you will be contacted sooner.   If You Need Anything After Your Visit  If you have any questions or concerns for your doctor, please call our main line at [phone] and press option 4 to reach your doctor's medical assistant. If no one answers, please leave a voicemail as directed and we will return your call as soon as possible. Messages left after 4 pm will be answered the following business day.   You may also send us a message via MyChart. We typically respond to MyChart messages within 1-2 business days.  For prescription refills, please ask your pharmacy to contact our office. Our fax number is [phone].  If you have an urgent issue when the clinic is closed that cannot wait until the next business day, you can page your doctor at the number below.    Please note that while we do our best to be available for urgent issues outside of office hours, we are not available 24/7.   If you have an urgent issue and are unable to reach us, you may choose to seek medical care at your doctor's office, retail clinic, urgent care center, or emergency room.  If you have a medical emergency, please immediately call 911 or go to the  emergency department.  Pager Numbers  - Dr. Kowalski: [phone]  - Dr. Moye: [phone]  - Dr. Stewart: [phone]  In the event of inclement weather, please call our main line at [phone] for an update on the status of any delays or closures.  Dermatology Medication Tips: Please keep the boxes that topical medications come in in order to help keep track of the instructions about where and how to use these. Pharmacies typically print the medication instructions only on the boxes and not directly on the medication tubes.   If your medication is too expensive, please contact our office at [phone] option 4 or send us a message through MyChart.   We are unable to tell what your co-pay for medications will be in advance as this is different depending on your insurance coverage. However, we may be able to find a substitute medication at lower cost or fill out paperwork to get insurance to cover a needed medication.   If a prior authorization is required to get your medication covered by your insurance company, please allow us 1-2 business days to complete this process.  Drug prices often vary depending on where the prescription is filled and some pharmacies may offer cheaper prices.  The website www.goodrx.com contains coupons for medications through different pharmacies. The prices here do not account for what the cost may be with help from insurance (it may be cheaper with your insurance), but the website can   give you the price if you did not use any insurance.  - You can print the associated coupon and take it with your prescription to the pharmacy.  - You may also stop by our office during regular business hours and pick up a GoodRx coupon card.  - If you need your prescription sent electronically to a different pharmacy, notify our office through [facility] MyChart or by phone at [phone] option 4.     Si Usted Necesita Algo Despus de Su Visita  Tambin puede  enviarnos un mensaje a travs de MyChart. Por lo general respondemos a los mensajes de MyChart en el transcurso de 1 a 2 das hbiles.  Para renovar recetas, por favor pida a su farmacia que se ponga en contacto con nuestra oficina. Nuestro nmero de fax es el [phone].  Si tiene un asunto urgente cuando la clnica est cerrada y que no puede esperar hasta el siguiente da hbil, puede llamar/localizar a su doctor(a) al nmero que aparece a continuacin.   Por favor, tenga en cuenta que aunque hacemos todo lo posible para estar disponibles para asuntos urgentes fuera del horario de oficina, no estamos disponibles las 24 horas del da, los 7 das de la semana.   Si tiene un problema urgente y no puede comunicarse con nosotros, puede optar por buscar atencin mdica  en el consultorio de su doctor(a), en una clnica privada, en un centro de atencin urgente o en una sala de emergencias.  Si tiene una emergencia mdica, por favor llame inmediatamente al 911 o vaya a la sala de emergencias.  Nmeros de bper  - Dr. Kowalski: [phone]  - Dra. Moye: [phone]  - Dra. Stewart: [phone]  En caso de inclemencias del tiempo, por favor llame a nuestra lnea principal al [phone] para una actualizacin sobre el estado de cualquier retraso o cierre.  Consejos para la medicacin en dermatologa: Por favor, guarde las cajas en las que vienen los medicamentos de uso tpico para ayudarle a seguir las instrucciones sobre dnde y cmo usarlos. Las farmacias generalmente imprimen las instrucciones del medicamento slo en las cajas y no directamente en los tubos del medicamento.   Si su medicamento es muy caro, por favor, pngase en contacto con nuestra oficina llamando al [phone] y presione la opcin 4 o envenos un mensaje a travs de MyChart.   No podemos decirle cul ser su copago por los medicamentos por adelantado ya que esto es diferente dependiendo de la cobertura de su seguro.  Sin embargo, es posible que podamos encontrar un medicamento sustituto a menor costo o llenar un formulario para que el seguro cubra el medicamento que se considera necesario.   Si se requiere una autorizacin previa para que su compaa de seguros cubra su medicamento, por favor permtanos de 1 a 2 das hbiles para completar este proceso.  Los precios de los medicamentos varan con frecuencia dependiendo del lugar de dnde se surte la receta y alguna farmacias pueden ofrecer precios ms baratos.  El sitio web www.goodrx.com tiene cupones para medicamentos de diferentes farmacias. Los precios aqu no tienen en cuenta lo que podra costar con la ayuda del seguro (puede ser ms barato con su seguro), pero el sitio web puede darle el precio si no utiliz ningn seguro.  - Puede imprimir el cupn correspondiente y llevarlo con su receta a la farmacia.  - Tambin puede pasar por nuestra oficina durante el horario de atencin regular y recoger una tarjeta de cupones de GoodRx.  -   Si necesita que su receta se enve electrnicamente a una farmacia diferente, informe a nuestra oficina a travs de MyChart de [place] o por telfono llamando al [phone] y presione la opcin 4.  

## 2022-05-20 ENCOUNTER — Encounter: Payer: Self-pay | Admitting: Dermatology

## 2022-06-09 ENCOUNTER — Other Ambulatory Visit: Payer: Self-pay | Admitting: Physician Assistant

## 2022-06-09 DIAGNOSIS — E119 Type 2 diabetes mellitus without complications: Secondary | ICD-10-CM

## 2022-06-12 NOTE — Telephone Encounter (Signed)
Requested Prescriptions  Pending Prescriptions Disp Refills  . Blood Glucose Monitoring Suppl (ONE TOUCH ULTRA 2) w/Device KIT [Pharmacy Med Name: ONE TOUCH ULTRA2 GLUCOSE SYST] 1 kit 0    Sig: USE TO CHECK BLOOD SUGAR UP TO 4 TIMES DAILY     Endocrinology: Diabetes - Testing Supplies Passed - 06/09/2022  6:26 PM      Passed - Valid encounter within last 12 months    Recent Outpatient Visits          2 months ago Acute right-sided thoracic back pain   [facility] Mikey Kirschner, PA-C   4 months ago Controlled type 2 diabetes mellitus without complication, without long-term current use of insulin ([place])   [facility] Thedore Mins, [place], PA-C   6 months ago SVT (supraventricular tachycardia) [facility])   [facility] Thedore Mins, [place], PA-C   8 months ago Acute non-recurrent frontal sinusitis   [facility] Thedore Mins, [place], PA-C   10 months ago Controlled type 2 diabetes mellitus without complication, without long-term current use of insulin [facility])   [facility] Mikey Kirschner, PA-C      Future Appointments            In 2 months Drubel, Ria Comment, PA-C [facility], [place]   In 3 months End, Harrell Gave, MD [place]. [place]   In 11 months Ralene Bathe, MD [place]

## 2022-07-12 ENCOUNTER — Ambulatory Visit: Payer: Medicare Other | Admitting: Physician Assistant

## 2022-07-22 ENCOUNTER — Other Ambulatory Visit: Payer: Self-pay | Admitting: Physician Assistant

## 2022-07-22 DIAGNOSIS — E7800 Pure hypercholesterolemia, unspecified: Secondary | ICD-10-CM

## 2022-08-02 ENCOUNTER — Other Ambulatory Visit: Payer: Self-pay | Admitting: Physician Assistant

## 2022-08-13 ENCOUNTER — Other Ambulatory Visit: Payer: Self-pay | Admitting: Family Medicine

## 2022-08-13 DIAGNOSIS — E119 Type 2 diabetes mellitus without complications: Secondary | ICD-10-CM

## 2022-08-14 ENCOUNTER — Encounter: Payer: Self-pay | Admitting: Physician Assistant

## 2022-08-14 ENCOUNTER — Ambulatory Visit (INDEPENDENT_AMBULATORY_CARE_PROVIDER_SITE_OTHER): Payer: Medicare Other | Admitting: Physician Assistant

## 2022-08-14 VITALS — BP 160/84 | HR 62 | Wt 195.5 lb

## 2022-08-14 DIAGNOSIS — E1159 Type 2 diabetes mellitus with other circulatory complications: Secondary | ICD-10-CM

## 2022-08-14 DIAGNOSIS — Z23 Encounter for immunization: Secondary | ICD-10-CM | POA: Diagnosis not present

## 2022-08-14 DIAGNOSIS — E1169 Type 2 diabetes mellitus with other specified complication: Secondary | ICD-10-CM

## 2022-08-14 DIAGNOSIS — E119 Type 2 diabetes mellitus without complications: Secondary | ICD-10-CM

## 2022-08-14 DIAGNOSIS — I152 Hypertension secondary to endocrine disorders: Secondary | ICD-10-CM

## 2022-08-14 DIAGNOSIS — E785 Hyperlipidemia, unspecified: Secondary | ICD-10-CM

## 2022-08-14 NOTE — Assessment & Plan Note (Addendum)
>>  ASSESSMENT AND PLAN FOR HYPERLIPIDEMIA ASSOCIATED WITH TYPE 2 DIABETES MELLITUS (HCC) WRITTEN ON 08/14/2022  9:53 AM BY Karthikeya Funke, PA-C  Chronic and well controlled Managed with asa and atorvastatin 10 mg Fasting lipids ordered for stability  >>ASSESSMENT AND PLAN FOR DIABETES MELLITUS TYPE II, CONTROLLED (HCC) WRITTEN ON 08/14/2022  9:31 AM BY Puja Caffey, PA-C  Managed with metformin 500 mg BID Ordered A1c, previously 6.0% On statin, ace I  Foot exam completed today  Uacr ordered Optho utd F/u 6 mo

## 2022-08-14 NOTE — Assessment & Plan Note (Signed)
Managed with metformin 500 mg BID Ordered A1c, previously 6.0% On statin, ace I  Foot exam completed today  Uacr ordered Optho utd F/u 6 mo

## 2022-08-14 NOTE — Progress Notes (Signed)
I,Sha'taria Tyson,acting as a Education administrator for [employer], PA-C.,have documented all relevant documentation on the behalf of Mikey Kirschner, PA-C,as directed by  Mikey Kirschner, PA-C while in the presence of Mikey Kirschner, PA-C.  Established patient visit   Patient: Joseph Cook   DOB: [birth year]   81 y.o. Male  MRN: [id number] Visit Date: 08/14/2022  Today's healthcare provider: Mikey Kirschner, PA-C   Cc. DM II f/u   Subjective    HPI   Diabetes Mellitus Type II, Follow-up  Lab Results  Component Value Date   HGBA1C 6.0 (A) 02/09/2022   HGBA1C 8.7 (H) 11/14/2021   HGBA1C 6.8 (A) 08/12/2021   Wt Readings from Last 3 Encounters:  08/14/22 195 lb 8 oz (88.7 kg)  04/13/22 193 lb 14.4 oz (88 kg)  03/14/22 192 lb (87.1 kg)   Last seen for diabetes 6 months ago.  Management since then includes continue current treatment. He reports excellent compliance with treatment. He is not having side effects.  Symptoms: No fatigue No foot ulcerations  No appetite changes No nausea  No paresthesia of the feet  No polydipsia  No polyuria No visual disturbances   No vomiting     Home blood sugar records: fasting range: 160's  Episodes of hypoglycemia? No    Current insulin regiment: none  Most Recent Eye Exam: April 2023 Current exercise: walking Current diet habits: well balanced  Pertinent Labs: Lab Results  Component Value Date   CHOL 124 08/12/2021   HDL 37 (L) 08/12/2021   LDLCALC 60 08/12/2021   TRIG 158 (H) 08/12/2021   CHOLHDL 3.4 08/12/2021   Lab Results  Component Value Date   NA 142 11/25/2021   K 4.1 11/25/2021   CREATININE 0.73 (L) 11/25/2021   EGFR 92 11/25/2021   LABMICR 8.1 08/12/2021     ---------------------------------------------------------------------------------------------------   Medications: Outpatient Medications Prior to Visit  Medication Sig   aspirin 325 MG tablet Take 325 mg by mouth daily.   atorvastatin (LIPITOR) 10 MG  tablet TAKE 1 TABLET BY MOUTH EVERYDAY AT BEDTIME   Blood Glucose Monitoring Suppl (ONE TOUCH ULTRA 2) w/Device KIT USE TO CHECK BLOOD SUGAR UP TO 4 TIMES DAILY   Cholecalciferol (VITAMIN D3) 50 MCG (2000 UT) TABS Take 2,000 Units by mouth daily.    glucose blood (ONETOUCH ULTRA) test strip Use to test blood sugar once a day.   Lancets (ONETOUCH DELICA PLUS [id number]) MISC USE TO TEST BLOOD SUGAR AT LEAST ONCE DAILY   lisinopril-hydrochlorothiazide (ZESTORETIC) 20-25 MG tablet TAKE ONE TABLET BY MOUTH EVERY DAY FOR BLOOD PRESSURE AND KIDNEY PROTECTION   metFORMIN (GLUCOPHAGE) 500 MG tablet Take 1 tablet (500 mg total) by mouth 2 (two) times daily with a meal.   metoprolol succinate (TOPROL-XL) 100 MG 24 hr tablet TAKE 1 TABLET BY MOUTH EVERY DAY WITH OR IMMEDIATELY FOLLOWING A MEAL   Multiple Vitamins-Minerals (PRESERVISION AREDS 2 PO) Take 1 capsule by mouth 2 (two) times daily.    mupirocin ointment (BACTROBAN) 2 % Apply to skin qd-bid   omeprazole (PRILOSEC) 40 MG capsule TAKE 1 CAPSULE BY MOUTH EVERY DAY   tamsulosin (FLOMAX) 0.4 MG CAPS capsule TAKE 1 CAPSULE BY MOUTH EVERY DAY   No facility-administered medications prior to visit.    Review of Systems  Constitutional:  Negative for fatigue and fever.  Respiratory:  Negative for cough and shortness of breath.   Cardiovascular:  Negative for chest pain, palpitations and leg swelling.  Neurological:  Negative for dizziness and headaches.      Objective    Blood pressure (!) 160/84, pulse 62, weight 195 lb 8 oz (88.7 kg), SpO2 100 %.   Physical Exam Constitutional:      General: He is awake.     Appearance: He is well-developed.  HENT:     Head: Normocephalic.  Eyes:     Conjunctiva/sclera: Conjunctivae normal.  Cardiovascular:     Rate and Rhythm: Normal rate and regular rhythm.     Pulses:          Dorsalis pedis pulses are 2+ on the right side and 2+ on the left side.       Posterior tibial pulses are 2+ on the right side  and 2+ on the left side.     Heart sounds: Normal heart sounds.  Pulmonary:     Effort: Pulmonary effort is normal.     Breath sounds: Normal breath sounds.  Musculoskeletal:     Right lower leg: Swelling present.     Right ankle: Swelling present.     Right foot: No swelling.  Feet:     Right foot:     Protective Sensation: 3 sites tested.  3 sites sensed.     Skin integrity: Skin integrity normal.     Toenail Condition: Right toenails are normal.     Left foot:     Protective Sensation: 3 sites tested.  3 sites sensed.     Skin integrity: Skin integrity normal.     Toenail Condition: Left toenails are normal.  Skin:    General: Skin is warm.  Neurological:     Mental Status: He is alert and oriented to person, place, and time.  Psychiatric:        Attention and Perception: Attention normal.        Mood and Affect: Mood normal.        Speech: Speech normal.        Behavior: Behavior is cooperative.     No results found for any visits on 08/14/22.  Assessment & Plan     Problem List Items Addressed This Visit       Cardiovascular and Mediastinum   Hypertension associated with diabetes ([employer])    Chronic, elevated today, usually only moderately controlled in office. Managed with lisinopril 20 hctz 25 metoprolol 100 mg  Ordered cmp F/b cardiology.      Relevant Orders   Comprehensive Metabolic Panel (CMET)     Endocrine   Diabetes mellitus type II, controlled ([place]) - Primary    Managed with metformin 500 mg BID Ordered A1c, previously 6.0% On statin, ace I  Foot exam completed today  Uacr ordered Optho utd F/u 6 mo        Relevant Orders   Urine Microalbumin w/creat. ratio   Comprehensive Metabolic Panel (CMET)   HgB A1c   Hyperlipidemia associated with type 2 diabetes mellitus ([facility])    Chronic and well controlled Managed with asa and atorvastatin 10 mg Fasting lipids ordered for stability      Relevant Orders   Comprehensive Metabolic Panel (CMET)    Lipid panel   Other Visit Diagnoses     Need for immunization against influenza       Relevant Orders   Flu Vaccine QUAD High Dose(Fluad) (Completed)        Return in about 6 months (around 02/12/2023) for DMII; pending labs.      I, Mikey Kirschner, PA-C have reviewed all  documentation for this visit. The documentation on  08/14/2022  for the exam, diagnosis, procedures, and orders are all accurate and complete.  Mikey Kirschner, PA-C [facility] [address] [place], [place], [address] Office: [phone] Fax: [facility]

## 2022-08-14 NOTE — Assessment & Plan Note (Addendum)
Chronic, elevated today, usually only moderately controlled in office. Managed with lisinopril 20 hctz 25 metoprolol 100 mg  Ordered cmp F/b cardiology.

## 2022-08-15 LAB — COMPREHENSIVE METABOLIC PANEL
ALT: 19 IU/L (ref 0–44)
AST: 11 IU/L (ref 0–40)
Albumin/Globulin Ratio: 2.4 — ABNORMAL HIGH (ref 1.2–2.2)
Albumin: 4.7 g/dL (ref 3.7–4.7)
Alkaline Phosphatase: 45 IU/L (ref 44–121)
BUN/Creatinine Ratio: 28 — ABNORMAL HIGH (ref 10–24)
BUN: 25 mg/dL (ref 8–27)
Bilirubin Total: 0.7 mg/dL (ref 0.0–1.2)
CO2: 22 mmol/L (ref 20–29)
Calcium: 9.9 mg/dL (ref 8.6–10.2)
Chloride: 102 mmol/L (ref 96–106)
Creatinine, Ser: 0.89 mg/dL (ref 0.76–1.27)
Globulin, Total: 2 g/dL (ref 1.5–4.5)
Glucose: 152 mg/dL — ABNORMAL HIGH (ref 70–99)
Potassium: 4.9 mmol/L (ref 3.5–5.2)
Sodium: 141 mmol/L (ref 134–144)
Total Protein: 6.7 g/dL (ref 6.0–8.5)
eGFR: 86 mL/min/{1.73_m2} (ref >59)

## 2022-08-15 LAB — LIPID PANEL
Chol/HDL Ratio: 3.2 ratio (ref 0.0–5.0)
Cholesterol, Total: 114 mg/dL (ref 100–199)
HDL: 36 mg/dL — ABNORMAL LOW (ref >39)
LDL Chol Calc (NIH): 56 mg/dL (ref 0–99)
Triglycerides: 119 mg/dL (ref 0–149)
VLDL Cholesterol Cal: 22 mg/dL (ref 5–40)

## 2022-08-15 LAB — MICROALBUMIN / CREATININE URINE RATIO
Creatinine, Urine: 132.2 mg/dL
Microalb/Creat Ratio: 7 mg/g creat (ref 0–29)
Microalbumin, Urine: 9.9 ug/mL

## 2022-08-15 LAB — HEMOGLOBIN A1C
Est. average glucose Bld gHb Est-mCnc: 157 mg/dL
Hgb A1c MFr Bld: 7.1 % — ABNORMAL HIGH (ref 4.8–5.6)

## 2022-08-15 MED ORDER — METFORMIN HCL ER 750 MG PO TB24: 750.0000 mg | ORAL_TABLET | Freq: Every day | ORAL | 0 refills | Status: DC

## 2022-08-15 NOTE — Addendum Note (Signed)
Addended by: Smitty Knudsen on: 08/15/2022 01:48 PM   Modules accepted: Orders

## 2022-09-15 ENCOUNTER — Ambulatory Visit: Payer: Medicare Other | Attending: Internal Medicine | Admitting: Internal Medicine

## 2022-09-15 ENCOUNTER — Encounter: Payer: Self-pay | Admitting: Internal Medicine

## 2022-09-15 VITALS — BP 144/76 | HR 57 | Ht 70.0 in | Wt 199.4 lb

## 2022-09-15 DIAGNOSIS — I2510 Atherosclerotic heart disease of native coronary artery without angina pectoris: Secondary | ICD-10-CM | POA: Diagnosis not present

## 2022-09-15 DIAGNOSIS — E785 Hyperlipidemia, unspecified: Secondary | ICD-10-CM

## 2022-09-15 DIAGNOSIS — I471 Supraventricular tachycardia, unspecified: Secondary | ICD-10-CM | POA: Diagnosis not present

## 2022-09-15 DIAGNOSIS — I2584 Coronary atherosclerosis due to calcified coronary lesion: Secondary | ICD-10-CM | POA: Diagnosis present

## 2022-09-15 DIAGNOSIS — E1169 Type 2 diabetes mellitus with other specified complication: Secondary | ICD-10-CM | POA: Diagnosis not present

## 2022-09-15 DIAGNOSIS — I10 Essential (primary) hypertension: Secondary | ICD-10-CM | POA: Diagnosis present

## 2022-09-15 DIAGNOSIS — I700 Atherosclerosis of aorta: Secondary | ICD-10-CM | POA: Diagnosis present

## 2022-09-15 MED ORDER — LISINOPRIL-HYDROCHLOROTHIAZIDE 20-12.5 MG PO TABS: 2.0000 | ORAL_TABLET | Freq: Every day | ORAL | 2 refills | Status: DC

## 2022-09-15 NOTE — Patient Instructions (Signed)
Medication Instructions:  CHANGE the Lisinopril-Hydrochlorothiazide to 40-25 mg (2 of the 20-12.5 mg tablets daily) *If you need a refill on your cardiac medications before your next appointment, please call your pharmacy*   Lab Work: Your provider would like for you to return in 2 weeks to have the following labs drawn: BMET.   Please go to the [facility] entrance and check in at the front desk.  You do not need an appointment.  They are open from 7am-6 pm.  You will not need to be fasting.  If you have labs (blood work) drawn today and your tests are completely normal, you will receive your results only by: [place] (if you have MyChart) OR A paper copy in the mail If you have any lab test that is abnormal or we need to change your treatment, we will call you to review the results.   Testing/Procedures: None ordered   Follow-Up: At [facility], you and your health needs are our priority.  As part of our continuing mission to provide you with exceptional heart care, we have created designated Provider Care Teams.  These Care Teams include your primary Cardiologist (physician) and Advanced Practice Providers (APPs -  Physician Assistants and Nurse Practitioners) who all work together to provide you with the care you need, when you need it.  We recommend signing up for the patient portal called "MyChart".  Sign up information is provided on this After Visit Summary.  MyChart is used to connect with patients for Virtual Visits (Telemedicine).  Patients are able to view lab/test results, encounter notes, upcoming appointments, etc.  Non-urgent messages can be sent to your provider as well.   To learn more about what you can do with MyChart, go to [email or web address].    Your next appointment:   3 month(s)  The format for your next appointment:   In Person  Provider:   You may see Nelva Bush, MD or one of the following Advanced Practice Providers on  your designated Care Team:   Murray Hodgkins, NP Christell Faith, PA-C Cadence Kathlen Mody, PA-C Gerrie Nordmann, NP
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
## 2022-09-29 ENCOUNTER — Other Ambulatory Visit: Payer: Self-pay | Admitting: Physician Assistant

## 2022-09-29 DIAGNOSIS — R09A2 Foreign body sensation, throat: Secondary | ICD-10-CM

## 2022-10-10 ENCOUNTER — Other Ambulatory Visit
Admission: RE | Admit: 2022-10-10 | Discharge: 2022-10-10 | Disposition: A | Discharge status: Home / Self Care | Payer: Medicare Other | Attending: Internal Medicine | Admitting: Internal Medicine

## 2022-10-10 DIAGNOSIS — I10 Essential (primary) hypertension: Secondary | ICD-10-CM | POA: Diagnosis present

## 2022-10-10 LAB — BASIC METABOLIC PANEL
Anion gap: 11 (ref 5–15)
BUN: 21 mg/dL (ref 8–23)
CO2: 25 mmol/L (ref 22–32)
Calcium: 9.1 mg/dL (ref 8.9–10.3)
Chloride: 104 mmol/L (ref 98–111)
Creatinine, Ser: 0.86 mg/dL (ref 0.61–1.24)
GFR, Estimated: >60 mL/min (ref >60)
Glucose, Bld: 232 mg/dL — ABNORMAL HIGH (ref 70–99)
Potassium: 4.9 mmol/L (ref 3.5–5.1)
Sodium: 140 mmol/L (ref 135–145)

## 2022-11-07 ENCOUNTER — Other Ambulatory Visit: Payer: Self-pay | Admitting: Physician Assistant

## 2022-11-07 DIAGNOSIS — E119 Type 2 diabetes mellitus without complications: Secondary | ICD-10-CM

## 2022-12-07 ENCOUNTER — Other Ambulatory Visit: Payer: Self-pay | Admitting: Internal Medicine

## 2022-12-28 ENCOUNTER — Ambulatory Visit: Payer: 59 | Admitting: Internal Medicine

## 2023-01-02 ENCOUNTER — Other Ambulatory Visit: Payer: Self-pay | Admitting: Internal Medicine

## 2023-01-03 ENCOUNTER — Ambulatory Visit: Payer: Medicare Other | Attending: Internal Medicine | Admitting: Internal Medicine

## 2023-01-03 ENCOUNTER — Encounter: Payer: Self-pay | Admitting: Internal Medicine

## 2023-01-03 VITALS — BP 144/76 | HR 57 | Ht 70.0 in | Wt 199.4 lb

## 2023-01-03 DIAGNOSIS — I2584 Coronary atherosclerosis due to calcified coronary lesion: Secondary | ICD-10-CM | POA: Diagnosis present

## 2023-01-03 DIAGNOSIS — E1169 Type 2 diabetes mellitus with other specified complication: Secondary | ICD-10-CM | POA: Diagnosis present

## 2023-01-03 DIAGNOSIS — I700 Atherosclerosis of aorta: Secondary | ICD-10-CM | POA: Diagnosis present

## 2023-01-03 DIAGNOSIS — I2510 Atherosclerotic heart disease of native coronary artery without angina pectoris: Secondary | ICD-10-CM | POA: Insufficient documentation

## 2023-01-03 DIAGNOSIS — E785 Hyperlipidemia, unspecified: Secondary | ICD-10-CM | POA: Insufficient documentation

## 2023-01-03 DIAGNOSIS — I471 Supraventricular tachycardia, unspecified: Secondary | ICD-10-CM | POA: Insufficient documentation

## 2023-01-03 DIAGNOSIS — I10 Essential (primary) hypertension: Secondary | ICD-10-CM | POA: Insufficient documentation

## 2023-01-03 MED ORDER — LISINOPRIL-HYDROCHLOROTHIAZIDE 20-12.5 MG PO TABS: 2.0000 | ORAL_TABLET | Freq: Every day | ORAL | 3 refills | Status: DC

## 2023-01-03 NOTE — Progress Notes (Signed)
Follow-up Outpatient Visit Date: 01/03/2023  Primary Care Provider: Alfredia Ferguson, PA-C [address] [place] [address]  Chief Complaint: Follow-up SVT and hypertension  HPI:  Joseph Cook is a 82 y.o. male with history of coronary artery calcification, aortic atherosclerosis, PSVT, longstanding PVCs, hypertension, hyperlipidemia, and type 2 diabetes mellitus, who presents for follow-up of VTE.  I last saw him in December, at which time he was feeling fairly well with less frequent palpitations though he still noted sporadic flutters.  We agreed to continue metoprolol succinate 100 mg daily.  Lisinopril-HCTZ was increased due to suboptimal blood pressure control.  A, Joseph Cook reports that he has been feeling fairly well.  He he experiences very brief palpitations a few times a week, usually only lasting a "split-second."  He denies chest pain, shortness of breath, and lightheadedness.  His chronic lower extremity edema, right greater than left, is stable per his report, though his wife thinks it has gotten a bit worse.  He does not monitor his blood pressure regularly at home.  He tries to limit his sodium intake.  --------------------------------------------------------------------------------------------------  Past Medical History:  Diagnosis Date   Actinic keratosis    Basal cell carcinoma    Cataract    Diabetes mellitus    Dysplastic nevus 08/13/2009   right medial lower leg above medial ankle, malleolus -severe   Glaucoma    Hyperlipidemia    Hypertension    PVC (premature ventricular contraction)    Past Surgical History:  Procedure Laterality Date   HERNIA REPAIR     ROTATOR CUFF REPAIR     TEE WITHOUT CARDIOVERSION N/A 01/06/2022   Procedure: TRANSESOPHAGEAL ECHOCARDIOGRAM (TEE);  Surgeon: Antonieta Iba, MD;  Location: [facility] ORS;  Service: Cardiovascular;  Laterality: N/A;    Current Meds  Medication Sig   aspirin 325 MG tablet Take 325 mg by mouth  daily.   atorvastatin (LIPITOR) 10 MG tablet TAKE 1 TABLET BY MOUTH EVERYDAY AT BEDTIME   Blood Glucose Monitoring Suppl (ONE TOUCH ULTRA 2) w/Device KIT USE TO CHECK BLOOD SUGAR UP TO 4 TIMES DAILY   Cholecalciferol (VITAMIN D3) 50 MCG (2000 UT) TABS Take 2,000 Units by mouth daily.    glucose blood (ONETOUCH ULTRA) test strip Use to test blood sugar once a day.   Lancets (ONETOUCH DELICA PLUS [id number]) MISC USE TO TEST BLOOD SUGAR AT LEAST ONCE DAILY   lisinopril-hydrochlorothiazide (ZESTORETIC) 20-12.5 MG tablet TAKE 2 TABLETS BY MOUTH EVERY DAY   metFORMIN (GLUCOPHAGE-XR) 750 MG 24 hr tablet TAKE 1 TABLET BY MOUTH EVERY DAY WITH BREAKFAST   metoprolol succinate (TOPROL-XL) 100 MG 24 hr tablet TAKE 1 TABLET BY MOUTH EVERY DAY WITH OR IMMEDIATELY FOLLOWING A MEAL   Multiple Vitamins-Minerals (PRESERVISION AREDS 2 PO) Take 1 capsule by mouth 2 (two) times daily.    mupirocin ointment (BACTROBAN) 2 % Apply to skin qd-bid   omeprazole (PRILOSEC) 40 MG capsule TAKE 1 CAPSULE BY MOUTH EVERY DAY   tamsulosin (FLOMAX) 0.4 MG CAPS capsule TAKE 1 CAPSULE BY MOUTH EVERY DAY    Allergies: Patient has no known allergies.  Social History   Tobacco Use   Smoking status: Never   Smokeless tobacco: Former  Building services engineer Use: Never used  Substance Use Topics   Alcohol use: Not Currently   Drug use: No    Family History  Problem Relation Age of Onset   Stroke Mother    Heart attack Father    Alzheimer's  disease Maternal Aunt    Heart attack Maternal Uncle    Cancer Maternal Grandmother    Cancer Paternal Grandmother     Review of Systems: A 12-system review of systems was performed and was negative except as noted in the HPI.  --------------------------------------------------------------------------------------------------  Physical Exam: BP (!) 144/76 (BP Location: Left Arm)   Pulse (!) 57   Ht 5\' 10"  (1.778 m)   Wt 199 lb 6 oz (90.4 kg)   SpO2 98%   BMI 28.61 kg/m   Repeat BP: 144/76  General:  NAD. Neck: No JVD or HJR. Lungs: Clear to auscultation bilaterally without wheezes or crackles. Heart: Bradycardic but regular with 1/6 systolic murmur. Abdomen: Soft, nontender, nondistended. Extremities: 1+ right and trace left pretibial edema.   Lab Results  Component Value Date   WBC 7.0 11/15/2021   HGB 14.6 11/15/2021   HCT 41.6 11/15/2021   MCV 85.4 11/15/2021   PLT 148 (L) 11/15/2021    Lab Results  Component Value Date   NA 140 10/10/2022   K 4.9 10/10/2022   CL 104 10/10/2022   CO2 25 10/10/2022   BUN 21 10/10/2022   CREATININE 0.86 10/10/2022   GLUCOSE 232 (H) 10/10/2022   ALT 19 08/14/2022    Lab Results  Component Value Date   CHOL 114 08/14/2022   HDL 36 (L) 08/14/2022   LDLCALC 56 08/14/2022   TRIG 119 08/14/2022   CHOLHDL 3.2 08/14/2022    --------------------------------------------------------------------------------------------------  ASSESSMENT AND PLAN: PSVT: Only very brief, self-limited palpitations noted.  No prolonged episodes to suggest SVT.  Continue metoprolol succinate.  Hypertension: Blood pressure remains suboptimally controlled despite escalation of lisinopril-HCTZ at our last visit.  We discussed adding another agent but will defer this in favor of home blood pressure monitoring and continued attention to sodium restriction as well as increase in activity.  If blood pressure remains consistently above 130/80, further pharmacotherapy will need to be considered.  I would typically use amlodipine as my next agent, though this may complicate his chronic leg edema.  Alternatively, spironolactone could be tried.  Coronary artery calcification and aortic atherosclerosis: No angina reported.  Lipids adequately controlled in November.  Continue low-dose atorvastatin and low-dose aspirin (previously encouraged to de-escalate once he has exhausted his supply of 325 mg tablets).  Hyperlipidemia associated with  type 2 diabetes mellitus: Lipids adequately controlled.  Continue low-dose atorvastatin ongoing management of DM per Ms. Drubel.  Follow-up: Return to clinic in 3 months with APP for blood pressure check.  Yvonne Kendall, MD 01/03/2023 9:30 AM

## 2023-01-03 NOTE — Patient Instructions (Signed)
Medication Instructions:  Your Physician recommend you continue on your current medication as directed.    *If you need a refill on your cardiac medications before your next appointment, please call your pharmacy*   Lab Work: None ordered today   Testing/Procedures: None ordered today   Follow-Up: At [facility], you and your health needs are our priority.  As part of our continuing mission to provide you with exceptional heart care, we have created designated Provider Care Teams.  These Care Teams include your primary Cardiologist (physician) and Advanced Practice Providers (APPs -  Physician Assistants and Nurse Practitioners) who all work together to provide you with the care you need, when you need it.  We recommend signing up for the patient portal called "MyChart".  Sign up information is provided on this After Visit Summary.  MyChart is used to connect with patients for Virtual Visits (Telemedicine).  Patients are able to view lab/test results, encounter notes, upcoming appointments, etc.  Non-urgent messages can be sent to your provider as well.   To learn more about what you can do with MyChart, go to [email or web address].    Your next appointment:   3 month(s)  Provider:   You will see one of the following Advanced Practice Providers on your designated Care Team:   Nicolasa Ducking, NP Eula Listen, PA-C Cadence Fransico Michael, PA-C Charlsie Quest, NP  DASH Eating Plan DASH stands for Dietary Approaches to Stop Hypertension. The DASH eating plan is a healthy eating plan that has been shown to: Reduce high blood pressure (hypertension). Reduce your risk for type 2 diabetes, heart disease, and stroke. Help with weight loss. What are tips for following this plan? Reading food labels Check food labels for the amount of salt (sodium) per serving. Choose foods with less than 5 percent of the Daily Value of sodium. Generally, foods with less than 300 milligrams (mg) of sodium  per serving fit into this eating plan. To find whole grains, look for the word "whole" as the first word in the ingredient list. Shopping Buy products labeled as "low-sodium" or "no salt added." Buy fresh foods. Avoid canned foods and pre-made or frozen meals. Cooking Avoid adding salt when cooking. Use salt-free seasonings or herbs instead of table salt or sea salt. Check with your health care provider or pharmacist before using salt substitutes. Do not fry foods. Cook foods using healthy methods such as baking, boiling, grilling, roasting, and broiling instead. Cook with heart-healthy oils, such as olive, canola, avocado, soybean, or sunflower oil. Meal planning  Eat a balanced diet that includes: 4 or more servings of fruits and 4 or more servings of vegetables each day. Try to fill one-half of your plate with fruits and vegetables. 6-8 servings of whole grains each day. Less than 6 oz (170 g) of lean meat, poultry, or fish each day. A 3-oz (85-g) serving of meat is about the same size as a deck of cards. One egg equals 1 oz (28 g). 2-3 servings of low-fat dairy each day. One serving is 1 cup (237 mL). 1 serving of nuts, seeds, or beans 5 times each week. 2-3 servings of heart-healthy fats. Healthy fats called omega-3 fatty acids are found in foods such as walnuts, flaxseeds, fortified milks, and eggs. These fats are also found in cold-water fish, such as sardines, salmon, and mackerel. Limit how much you eat of: Canned or prepackaged foods. Food that is high in trans fat, such as some fried foods. Food that is high  in saturated fat, such as fatty meat. Desserts and other sweets, sugary drinks, and other foods with added sugar. Full-fat dairy products. Do not salt foods before eating. Do not eat more than 4 egg yolks a week. Try to eat at least 2 vegetarian meals a week. Eat more home-cooked food and less restaurant, buffet, and fast food. Lifestyle When eating at a restaurant, ask  that your food be prepared with less salt or no salt, if possible. If you drink alcohol: Limit how much you use to: 0-1 drink a day for women who are not pregnant. 0-2 drinks a day for men. Be aware of how much alcohol is in your drink. In the [place] one drink equals one 12 oz bottle of beer (355 mL), one 5 oz glass of wine (148 mL), or one 1 oz glass of hard liquor (44 mL). General information Avoid eating more than 2,300 mg of salt a day. If you have hypertension, you may need to reduce your sodium intake to 1,500 mg a day. Work with your health care provider to maintain a healthy body weight or to lose weight. Ask what an ideal weight is for you. Get at least 30 minutes of exercise that causes your heart to beat faster (aerobic exercise) most days of the week. Activities may include walking, swimming, or biking. Work with your health care provider or dietitian to adjust your eating plan to your individual calorie needs. What foods should I eat? Fruits All fresh, dried, or frozen fruit. Canned fruit in natural juice (without added sugar). Vegetables Fresh or frozen vegetables (raw, steamed, roasted, or grilled). Low-sodium or reduced-sodium tomato and vegetable juice. Low-sodium or reduced-sodium tomato sauce and tomato paste. Low-sodium or reduced-sodium canned vegetables. Grains Whole-grain or whole-wheat bread. Whole-grain or whole-wheat pasta. Brown rice. Orpah Cobb. Bulgur. Whole-grain and low-sodium cereals. Pita bread. Low-fat, low-sodium crackers. Whole-wheat flour tortillas. Meats and other proteins Skinless chicken or [place]. Ground chicken or [place]. Pork with fat trimmed off. Fish and seafood. Egg whites. Dried beans, peas, or lentils. Unsalted nuts, nut butters, and seeds. Unsalted canned beans. Lean cuts of beef with fat trimmed off. Low-sodium, lean precooked or cured meat, such as sausages or meat loaves. Dairy Low-fat (1%) or fat-free (skim) milk. Reduced-fat, low-fat, or  fat-free cheeses. Nonfat, low-sodium ricotta or cottage cheese. Low-fat or nonfat yogurt. Low-fat, low-sodium cheese. Fats and oils Soft margarine without trans fats. Vegetable oil. Reduced-fat, low-fat, or light mayonnaise and salad dressings (reduced-sodium). Canola, safflower, olive, avocado, soybean, and sunflower oils. Avocado. Seasonings and condiments Herbs. Spices. Seasoning mixes without salt. Other foods Unsalted popcorn and pretzels. Fat-free sweets. The items listed above may not be a complete list of foods and beverages you can eat. Contact a dietitian for more information. What foods should I avoid? Fruits Canned fruit in a light or heavy syrup. Fried fruit. Fruit in cream or butter sauce. Vegetables Creamed or fried vegetables. Vegetables in a cheese sauce. Regular canned vegetables (not low-sodium or reduced-sodium). Regular canned tomato sauce and paste (not low-sodium or reduced-sodium). Regular tomato and vegetable juice (not low-sodium or reduced-sodium). Rosita Fire. Olives. Grains Baked goods made with fat, such as croissants, muffins, or some breads. Dry pasta or rice meal packs. Meats and other proteins Fatty cuts of meat. Ribs. Fried meat. Tomasa Blase. Bologna, salami, and other precooked or cured meats, such as sausages or meat loaves. Fat from the back of a pig (fatback). Bratwurst. Salted nuts and seeds. Canned beans with added salt. Canned or smoked fish.  Whole eggs or egg yolks. Chicken or [place] with skin. Dairy Whole or 2% milk, cream, and half-and-half. Whole or full-fat cream cheese. Whole-fat or sweetened yogurt. Full-fat cheese. Nondairy creamers. Whipped toppings. Processed cheese and cheese spreads. Fats and oils Butter. Stick margarine. Lard. Shortening. Ghee. Bacon fat. Tropical oils, such as coconut, palm kernel, or palm oil. Seasonings and condiments Onion salt, garlic salt, seasoned salt, table salt, and sea salt. Worcestershire sauce. Tartar sauce. Barbecue  sauce. Teriyaki sauce. Soy sauce, including reduced-sodium. Steak sauce. Canned and packaged gravies. Fish sauce. Oyster sauce. Cocktail sauce. Store-bought horseradish. Ketchup. Mustard. Meat flavorings and tenderizers. Bouillon cubes. Hot sauces. Pre-made or packaged marinades. Pre-made or packaged taco seasonings. Relishes. Regular salad dressings. Other foods Salted popcorn and pretzels. The items listed above may not be a complete list of foods and beverages you should avoid. Contact a dietitian for more information. Where to find more information National Heart, Lung, and Blood Institute: PopSteam.iswww.nhlbi.nih.gov American Heart Association: www.heart.org Academy of Nutrition and Dietetics: www.eatright.org National Kidney Foundation: www.kidney.org Summary The DASH eating plan is a healthy eating plan that has been shown to reduce high blood pressure (hypertension). It may also reduce your risk for type 2 diabetes, heart disease, and stroke. When on the DASH eating plan, aim to eat more fresh fruits and vegetables, whole grains, lean proteins, low-fat dairy, and heart-healthy fats. With the DASH eating plan, you should limit salt (sodium) intake to 2,300 mg a day. If you have hypertension, you may need to reduce your sodium intake to 1,500 mg a day. Work with your health care provider or dietitian to adjust your eating plan to your individual calorie needs. This information is not intended to replace advice given to you by your health care provider. Make sure you discuss any questions you have with your health care provider. Document Revised: 08/15/2019 Document Reviewed: 08/15/2019 Elsevier Patient Education  2023 [employer]

## 2023-01-12 ENCOUNTER — Other Ambulatory Visit: Payer: Self-pay | Admitting: Physician Assistant

## 2023-01-12 DIAGNOSIS — E7800 Pure hypercholesterolemia, unspecified: Secondary | ICD-10-CM

## 2023-01-31 ENCOUNTER — Other Ambulatory Visit: Payer: Self-pay | Admitting: Physician Assistant

## 2023-02-01 ENCOUNTER — Other Ambulatory Visit: Payer: Self-pay | Admitting: Physician Assistant

## 2023-02-01 DIAGNOSIS — E119 Type 2 diabetes mellitus without complications: Secondary | ICD-10-CM

## 2023-02-06 ENCOUNTER — Ambulatory Visit (INDEPENDENT_AMBULATORY_CARE_PROVIDER_SITE_OTHER): Payer: Medicare Other | Admitting: Physician Assistant

## 2023-02-06 ENCOUNTER — Encounter: Payer: Self-pay | Admitting: Physician Assistant

## 2023-02-06 VITALS — BP 144/70 | HR 62 | Wt 191.8 lb

## 2023-02-06 DIAGNOSIS — R109 Unspecified abdominal pain: Secondary | ICD-10-CM

## 2023-02-06 DIAGNOSIS — E119 Type 2 diabetes mellitus without complications: Secondary | ICD-10-CM | POA: Diagnosis not present

## 2023-02-06 LAB — POCT URINALYSIS DIPSTICK
Bilirubin, UA: NEGATIVE
Blood, UA: NEGATIVE
Glucose, UA: NEGATIVE
Ketones, UA: NEGATIVE
Leukocytes, UA: NEGATIVE
Nitrite, UA: NEGATIVE
Protein, UA: NEGATIVE
Spec Grav, UA: 1.025 (ref 1.010–1.025)
Urobilinogen, UA: 0.2 E.U./dL
pH, UA: 6.5 (ref 5.0–8.0)

## 2023-02-06 NOTE — Progress Notes (Signed)
I,Sha'taria Tyson,acting as a Neurosurgeon for [employer], PA-C.,have documented all relevant documentation on the behalf of Alfredia Ferguson, PA-C,as directed by  Alfredia Ferguson, PA-C while in the presence of Alfredia Ferguson, PA-C.   Established patient visit   Patient: Joseph Cook   DOB: 1941-06-10   82 y.o. Male  MRN: [id number] Visit Date: 02/06/2023  Today's healthcare provider: Alfredia Ferguson, PA-C   Cc. Flank pain f/u  Subjective     Flank Pain This is a recurrent problem. The current episode started 1 to 4 weeks ago (2 weeks). The problem is unchanged. The quality of the pain is described as stabbing. Radiates to: abdominal area. Pain scale: 6-7/10. The symptoms are aggravated by position and twisting. Associated symptoms include abdominal pain. He has tried nothing for the symptoms. The treatment provided no relief.  Pt reports this pain is similar to what he experienced last summer. He reports that resolved on its own. Pt states he has an [facility] appt later today.   Medications: Outpatient Medications Prior to Visit  Medication Sig   aspirin 325 MG tablet Take 325 mg by mouth daily.   atorvastatin (LIPITOR) 10 MG tablet TAKE 1 TABLET BY MOUTH EVERYDAY AT BEDTIME   Blood Glucose Monitoring Suppl (ONE TOUCH ULTRA 2) w/Device KIT USE TO CHECK BLOOD SUGAR UP TO 4 TIMES DAILY   Cholecalciferol (VITAMIN D3) 50 MCG (2000 UT) TABS Take 2,000 Units by mouth daily.    glucose blood (ONETOUCH ULTRA) test strip Use to test blood sugar once a day.   Lancets (ONETOUCH DELICA PLUS LANCET33G) MISC USE TO TEST BLOOD SUGAR AT LEAST ONCE DAILY   lisinopril-hydrochlorothiazide (ZESTORETIC) 20-12.5 MG tablet Take 2 tablets by mouth daily.   metFORMIN (GLUCOPHAGE-XR) 750 MG 24 hr tablet TAKE 1 TABLET BY MOUTH EVERY DAY WITH BREAKFAST   metoprolol succinate (TOPROL-XL) 100 MG 24 hr tablet TAKE 1 TABLET BY MOUTH EVERY DAY WITH OR IMMEDIATELY FOLLOWING A MEAL   Multiple Vitamins-Minerals  (PRESERVISION AREDS 2 PO) Take 1 capsule by mouth 2 (two) times daily.    mupirocin ointment (BACTROBAN) 2 % Apply to skin qd-bid   omeprazole (PRILOSEC) 40 MG capsule TAKE 1 CAPSULE BY MOUTH EVERY DAY   tamsulosin (FLOMAX) 0.4 MG CAPS capsule TAKE 1 CAPSULE BY MOUTH EVERY DAY   No facility-administered medications prior to visit.    Review of Systems  Gastrointestinal:  Positive for abdominal pain.  Genitourinary:  Positive for flank pain.     Objective    BP (!) 144/70   Pulse 62   Wt 191 lb 12.8 oz (87 kg)   SpO2 98%   BMI 27.52 kg/m    Physical Exam Vitals reviewed.  Constitutional:      Appearance: He is not ill-appearing.  HENT:     Head: Normocephalic.  Eyes:     Conjunctiva/sclera: Conjunctivae normal.  Cardiovascular:     Rate and Rhythm: Normal rate.  Pulmonary:     Effort: Pulmonary effort is normal. No respiratory distress.  Abdominal:     Palpations: Abdomen is soft.     Tenderness: There is no abdominal tenderness. There is no guarding.  Musculoskeletal:     Comments: No tenderness on exam to L flank/ribs  Skin:    Comments: No visible ecchymosis or rashes  Neurological:     General: No focal deficit present.     Mental Status: He is alert and oriented to person, place, and time.  Psychiatric:  Mood and Affect: Mood normal.        Behavior: Behavior normal.     Results for orders placed or performed in visit on 02/06/23  POCT Urinalysis Dipstick  Result Value Ref Range   Color, UA     Clarity, UA     Glucose, UA Negative Negative   Bilirubin, UA negative    Ketones, UA negative    Spec Grav, UA 1.025 1.010 - 1.025   Blood, UA negative    pH, UA 6.5 5.0 - 8.0   Protein, UA Negative Negative   Urobilinogen, UA 0.2 0.2 or 1.0 E.U./dL   Nitrite, UA negative    Leukocytes, UA Negative Negative   Appearance     Odor      Assessment & Plan       1. Right flank pain UA today neg leuks, blood.  Exam normal.  Advised he discuss w/  [facility], I appreciate pain as musculoskeletal Will order US abdomen -- RUQ to r/o liver, gallbladder etiology  - US Abdomen Complete; Future   2. Controlled type 2 diabetes mellitus without complication, without long-term current use of insulin (HCC) - HgB A1c  Return if symptoms worsen or fail to improve.      I, Alfredia Ferguson, PA-C have reviewed all documentation for this visit. The documentation on  02/06/23   for the exam, diagnosis, procedures, and orders are all accurate and complete.  Alfredia Ferguson, PA-C [facility] [address] [place], [place], [address] Office: [phone] Fax: [phone]   [facility]

## 2023-02-07 LAB — HEMOGLOBIN A1C
Est. average glucose Bld gHb Est-mCnc: 151 mg/dL
Hgb A1c MFr Bld: 6.9 % — ABNORMAL HIGH (ref 4.8–5.6)

## 2023-02-13 ENCOUNTER — Ambulatory Visit
Admission: RE | Admit: 2023-02-13 | Discharge: 2023-02-13 | Disposition: A | Discharge status: Home / Self Care | Payer: Medicare Other | Source: Ambulatory Visit | Attending: Physician Assistant | Admitting: Physician Assistant

## 2023-02-13 DIAGNOSIS — R109 Unspecified abdominal pain: Secondary | ICD-10-CM

## 2023-02-15 ENCOUNTER — Telehealth: Payer: Self-pay

## 2023-02-15 NOTE — Telephone Encounter (Signed)
Copied from CRM [id number]. Topic: General - Other >> Feb 15, 2023 10:13 AM Macon Large wrote: Reason for CRM: Pt called for an update on the imaging results. Cb# [phone]

## 2023-02-26 ENCOUNTER — Ambulatory Visit (INDEPENDENT_AMBULATORY_CARE_PROVIDER_SITE_OTHER): Payer: Medicare Other

## 2023-02-26 VITALS — Ht 70.0 in | Wt 191.0 lb

## 2023-02-26 DIAGNOSIS — Z0000 Encounter for general adult medical examination without abnormal findings: Secondary | ICD-10-CM

## 2023-02-26 NOTE — Progress Notes (Signed)
I connected with  FILBERT ECKART on 02/26/23 by a audio enabled telemedicine application and verified that I am speaking with the correct person using two identifiers.  Patient Location: Home  Provider Location: Office/Clinic  I discussed the limitations of evaluation and management by telemedicine. The patient expressed understanding and agreed to proceed.  Subjective:   Joseph Cook is a 82 y.o. male who presents for Medicare Annual/Subsequent preventive examination.  Review of Systems    Cardiac Risk Factors include: advanced age (>51men, >27 women);diabetes mellitus;dyslipidemia;hypertension;male gender    Objective:    Today's Vitals   02/26/23 1424  Weight: 191 lb (86.6 kg)  Height: 5\' 10"  (1.778 m)   Body mass index is 27.41 kg/m.     02/26/2023    2:30 PM 02/23/2022    2:35 PM 01/06/2022    7:51 AM 11/14/2021    3:42 PM 02/05/2020   10:17 AM  Advanced Directives  Does Patient Have a Medical Advance Directive? Yes Yes Yes No Yes  Type of Estate agent of [place];Living will Healthcare Power of [place];Living will   Healthcare Power of [place];Living will  Does patient want to make changes to medical advance directive?  Yes (Inpatient - patient defers changing a medical advance directive and declines information at this time)     Copy of Healthcare Power of Attorney in Chart?  No - copy requested   No - copy requested  Would patient like information on creating a medical advance directive?    No - Patient declined     Current Medications (verified) Outpatient Encounter Medications as of 02/26/2023  Medication Sig   aspirin 325 MG tablet Take 325 mg by mouth daily.   atorvastatin (LIPITOR) 10 MG tablet TAKE 1 TABLET BY MOUTH EVERYDAY AT BEDTIME   Blood Glucose Monitoring Suppl (ONE TOUCH ULTRA 2) w/Device KIT USE TO CHECK BLOOD SUGAR UP TO 4 TIMES DAILY   Cholecalciferol (VITAMIN D3) 50 MCG (2000 UT) TABS Take 2,000 Units by mouth daily.     glucose blood (ONETOUCH ULTRA) test strip Use to test blood sugar once a day.   Lancets (ONETOUCH DELICA PLUS LANCET33G) MISC USE TO TEST BLOOD SUGAR AT LEAST ONCE DAILY   lisinopril-hydrochlorothiazide (ZESTORETIC) 20-12.5 MG tablet Take 2 tablets by mouth daily.   metFORMIN (GLUCOPHAGE-XR) 750 MG 24 hr tablet TAKE 1 TABLET BY MOUTH EVERY DAY WITH BREAKFAST   metoprolol succinate (TOPROL-XL) 100 MG 24 hr tablet TAKE 1 TABLET BY MOUTH EVERY DAY WITH OR IMMEDIATELY FOLLOWING A MEAL   Multiple Vitamins-Minerals (PRESERVISION AREDS 2 PO) Take 1 capsule by mouth 2 (two) times daily.    mupirocin ointment (BACTROBAN) 2 % Apply to skin qd-bid   omeprazole (PRILOSEC) 40 MG capsule TAKE 1 CAPSULE BY MOUTH EVERY DAY   tamsulosin (FLOMAX) 0.4 MG CAPS capsule TAKE 1 CAPSULE BY MOUTH EVERY DAY   No facility-administered encounter medications on file as of 02/26/2023.    Allergies (verified) Patient has no known allergies.   History: Past Medical History:  Diagnosis Date   Actinic keratosis    Basal cell carcinoma    Cataract    Diabetes mellitus    Dysplastic nevus 08/13/2009   right medial lower leg above medial ankle, malleolus -severe   Glaucoma    Hyperlipidemia    Hypertension    PVC (premature ventricular contraction)    Past Surgical History:  Procedure Laterality Date   HERNIA REPAIR     ROTATOR CUFF REPAIR  TEE WITHOUT CARDIOVERSION N/A 01/06/2022   Procedure: TRANSESOPHAGEAL ECHOCARDIOGRAM (TEE);  Surgeon: Antonieta Iba, MD;  Location: [facility] ORS;  Service: Cardiovascular;  Laterality: N/A;   Family History  Problem Relation Age of Onset   Stroke Mother    Heart attack Father    Alzheimer's disease Maternal Aunt    Heart attack Maternal Uncle    Cancer Maternal Grandmother    Cancer Paternal Grandmother    Social History   Socioeconomic History   Marital status: Married    Spouse name: Not on file   Number of children: 5   Years of education: Not on file   Highest  education level: Some college, no degree  Occupational History   Occupation: retired  Tobacco Use   Smoking status: Never   Smokeless tobacco: Former  Building services engineer Use: Never used  Substance and Sexual Activity   Alcohol use: Not Currently   Drug use: No   Sexual activity: Not on file  Other Topics Concern   Not on file  Social History Narrative   Not on file   Social Determinants of Health   Financial Resource Strain: Low Risk  (02/26/2023)   Overall Financial Resource Strain (CARDIA)    Difficulty of Paying Living Expenses: Not hard at all  Food Insecurity: No Food Insecurity (02/26/2023)   Hunger Vital Sign    Worried About Running Out of Food in the Last Year: Never true    Ran Out of Food in the Last Year: Never true  Transportation Needs: No Transportation Needs (02/26/2023)   PRAPARE - Administrator, Civil Service (Medical): No    Lack of Transportation (Non-Medical): No  Physical Activity: Insufficiently Active (02/26/2023)   Exercise Vital Sign    Days of Exercise per Week: 2 days    Minutes of Exercise per Session: 40 min  Stress: No Stress Concern Present (02/26/2023)   [employer] of Occupational Health - Occupational Stress Questionnaire    Feeling of Stress : Not at all  Social Connections: Socially Integrated (02/26/2023)   Social Connection and Isolation Panel [NHANES]    Frequency of Communication with Friends and Family: More than three times a week    Frequency of Social Gatherings with Friends and Family: Once a week    Attends Religious Services: More than 4 times per year    Active Member of [employer] or Organizations: Yes    Attends Banker Meetings: Never    Marital Status: Married    Tobacco Counseling Counseling given: Not Answered   Clinical Intake:  Pre-visit preparation completed: Yes  Pain : No/denies pain   BMI - recorded: 27.41 Nutritional Status: BMI 25 -29 Overweight Nutritional Risks: None Diabetes:  Yes CBG done?: No Did pt. bring in CBG monitor from home?: No  How often do you need to have someone help you when you read instructions, pamphlets, or other written materials from your doctor or pharmacy?: 1 - Never  Diabetic?yes  Interpreter Needed?: No  Comments: lives with wife Information entered by :: B.Cannon Quinton,LPN   Activities of Daily Living    02/26/2023    2:31 PM 02/06/2023   10:02 AM  In your present state of health, do you have any difficulty performing the following activities:  Hearing? 0 0  Vision? 0 0  Difficulty concentrating or making decisions? 0 0  Walking or climbing stairs? 0 0  Dressing or bathing? 0 0  Doing errands, shopping? 0 0  Preparing Food and eating ? N   Using the Toilet? N   In the past six months, have you accidently leaked urine? N   Do you have problems with loss of bowel control? N   Managing your Medications? N   Managing your Finances? N   Housekeeping or managing your Housekeeping? N     Patient Care Team: Alfredia Ferguson, PA-C as PCP - General (Physician Assistant) End, Cristal Deer, MD as PCP - Cardiology (Cardiology) Lanier Prude, MD as PCP - Electrophysiology (Cardiology) Pa, [facility]) Deirdre Evener, MD (Dermatology)  Indicate any recent Medical Services you may have received from other than [facility] providers in the past year (date may be approximate).     Assessment:   This is a routine wellness examination for [place].  Hearing/Vision screen Hearing Screening - Comments:: [place] hearing Vision Screening - Comments:: Adequate vision; only readers [facility]  Dietary issues and exercise activities discussed: Current Exercise Habits: Home exercise routine, Type of exercise: walking, Time (Minutes): 40, Frequency (Times/Week): 2, Weekly Exercise (Minutes/Week): 80, Exercise limited by: cardiac condition(s);orthopedic condition(s)   Goals Addressed             This Visit's Progress     DIET - EAT MORE FRUITS AND VEGETABLES   On track    DIET - INCREASE WATER INTAKE   On track    Recommend to drink at least 6-8 8oz glasses of water per day.       Depression Screen    02/26/2023    2:28 PM 02/06/2023   10:02 AM 08/14/2022    9:23 AM 02/23/2022    2:33 PM 10/06/2021    3:43 PM 08/12/2021    8:24 AM 02/18/2021    9:56 AM  PHQ 2/9 Scores  PHQ - 2 Score 0 0 0 0 0 0 0  PHQ- 9 Score  0 0 0 0 0 0    Fall Risk    02/26/2023    2:26 PM 02/06/2023   10:02 AM 08/14/2022    9:22 AM 02/23/2022    2:37 PM 10/06/2021    3:42 PM  Fall Risk   Falls in the past year? 0 0 0 0 0  Number falls in past yr: 0 0 0 0 0  Injury with Fall? 0 0 0 0 0  Risk for fall due to : No Fall Risks No Fall Risks No Fall Risks No Fall Risks No Fall Risks  Follow up Education provided;Falls prevention discussed Falls evaluation completed Falls evaluation completed Falls evaluation completed     FALL RISK PREVENTION PERTAINING TO THE HOME:  Any stairs in or around the home? Yes  If so, are there any without handrails? Yes  Home free of loose throw rugs in walkways, pet beds, electrical cords, etc? Yes  Adequate lighting in your home to reduce risk of falls? Yes   ASSISTIVE DEVICES UTILIZED TO PREVENT FALLS:  Life alert? No  Use of a cane, walker or w/c? No  Grab bars in the bathroom? Yes  Shower chair or bench in shower? Yes does  not use Elevated toilet seat or a handicapped toilet? No    Cognitive Function:        02/26/2023    2:36 PM 02/23/2022    2:40 PM 02/18/2021    9:57 AM 02/05/2020   10:20 AM  6CIT Screen  What Year? 0 points 0 points 0 points 0 points  What month? 0 points 0 points  0 points 0 points  What time? 0 points 0 points 0 points 0 points  Count back from 20 0 points 0 points 0 points 0 points  Months in reverse 0 points 0 points 0 points 0 points  Repeat phrase 0 points 0 points 0 points 0 points  Total Score 0 points 0 points 0 points 0 points     Immunizations Immunization History  Administered Date(s) Administered   Fluad Quad(high Dose 65+) 08/11/2020, 08/12/2021, 08/14/2022   PFIZER(Purple Top)SARS-COV-2 Vaccination 10/15/2019, 11/05/2019   Tdap 06/18/2012    TDAP status: Up to date  Flu Vaccine status: Up to date  Pneumococcal vaccine status: Due, Education has been provided regarding the importance of this vaccine. Advised may receive this vaccine at local pharmacy or Health Dept. Aware to provide a copy of the vaccination record if obtained from local pharmacy or Health Dept. Verbalized acceptance and understanding.  Covid-19 vaccine status: Completed vaccines  Qualifies for Shingles Vaccine? Yes   Zostavax completed Yes  #1 :#2 is scheduled Shingrix Completed?: No.    Education has been provided regarding the importance of this vaccine. Patient has been advised to call insurance company to determine out of pocket expense if they have not yet received this vaccine. Advised may also receive vaccine at local pharmacy or Health Dept. Verbalized acceptance and understanding.  Screening Tests Health Maintenance  Topic Date Due   Zoster Vaccines- Shingrix (1 of 2) Never done   Pneumonia Vaccine 31+ Years old (1 of 1 - PCV) Never done   COVID-19 Vaccine (3 - Pfizer risk series) 12/03/2019   DTaP/Tdap/Td (2 - Td or Tdap) 06/18/2022   OPHTHALMOLOGY EXAM  12/27/2022   INFLUENZA VACCINE  04/26/2023   HEMOGLOBIN A1C  08/09/2023   Diabetic kidney evaluation - Urine ACR  08/15/2023   FOOT EXAM  08/15/2023   Diabetic kidney evaluation - eGFR measurement  10/11/2023   Medicare Annual Wellness (AWV)  02/26/2024   HPV VACCINES  Aged Out    Health Maintenance  Health Maintenance Due  Topic Date Due   Zoster Vaccines- Shingrix (1 of 2) Never done   Pneumonia Vaccine 22+ Years old (1 of 1 - PCV) Never done   COVID-19 Vaccine (3 - Pfizer risk series) 12/03/2019   DTaP/Tdap/Td (2 - Td or Tdap) 06/18/2022   OPHTHALMOLOGY EXAM   12/27/2022    Colorectal cancer screening: No longer required.   Lung Cancer Screening: (Low Dose CT Chest recommended if Age 70-80 years, 30 pack-year currently smoking OR have quit w/in 15years.) does not qualify.   Lung Cancer Screening Referral: no  Additional Screening:  Hepatitis C Screening: does not qualify; Completed yes  Vision Screening: Recommended annual ophthalmology exams for early detection of glaucoma and other disorders of the eye. Is the patient up to date with their annual eye exam?  Yes  Who is the provider or what is the name of the office in which the patient attends annual eye exams? [facility] If pt is not established with a provider, would they like to be referred to a provider to establish care? No .   Dental Screening: Recommended annual dental exams for proper oral hygiene  Community Resource Referral / Chronic Care Management: CRR required this visit?  No   CCM required this visit?  No    Plan:     I have personally reviewed and noted the following in the patient's chart:   Medical and social history Use of alcohol, tobacco or illicit  drugs  Current medications and supplements including opioid prescriptions. Patient is not currently taking opioid prescriptions. Functional ability and status Nutritional status Physical activity Advanced directives List of other physicians Hospitalizations, surgeries, and ER visits in previous 12 months Vitals Screenings to include cognitive, depression, and falls Referrals and appointments  In addition, I have reviewed and discussed with patient certain preventive protocols, quality metrics, and best practice recommendations. A written personalized care plan for preventive services as well as general preventive health recommendations were provided to patient.     Sue Lush, LPN   09/30/1094   Nurse Notes: The patient states he is doing well and has no concerns or questions at this time.

## 2023-02-26 NOTE — Patient Instructions (Addendum)
Joseph Cook , Thank you for taking time to come for your Medicare Wellness Visit. I appreciate your ongoing commitment to your health goals. Please review the following plan we discussed and let me know if I can assist you in the future.   These are the goals we discussed:  Goals      DIET - EAT MORE FRUITS AND VEGETABLES     DIET - INCREASE WATER INTAKE     Recommend to drink at least 6-8 8oz glasses of water per day.        This is a list of the screening recommended for you and due dates:  Health Maintenance  Topic Date Due   Zoster (Shingles) Vaccine (1 of 2) Never done   Pneumonia Vaccine (1 of 1 - PCV) Never done   COVID-19 Vaccine (3 - Pfizer risk series) 12/03/2019   DTaP/Tdap/Td vaccine (2 - Td or Tdap) 06/18/2022   Eye exam for diabetics  12/27/2022   Flu Shot  04/26/2023   Hemoglobin A1C  08/09/2023   Yearly kidney health urinalysis for diabetes  08/15/2023   Complete foot exam   08/15/2023   Yearly kidney function blood test for diabetes  10/11/2023   Medicare Annual Wellness Visit  02/26/2024   HPV Vaccine  Aged Out    Advanced directives: yes  Conditions/risks identified: low falls risk  Next appointment: Follow up in one year for your annual wellness visit. 02/27/2024 @2 :00pm telephone  Preventive Care 65 Years and Older, Male  Preventive care refers to lifestyle choices and visits with your health care provider that can promote health and wellness. What does preventive care include? A yearly physical exam. This is also called an annual well check. Dental exams once or twice a year. Routine eye exams. Ask your health care provider how often you should have your eyes checked. Personal lifestyle choices, including: Daily care of your teeth and gums. Regular physical activity. Eating a healthy diet. Avoiding tobacco and drug use. Limiting alcohol use. Practicing safe sex. Taking low doses of aspirin every day. Taking vitamin and mineral supplements as  recommended by your health care provider. What happens during an annual well check? The services and screenings done by your health care provider during your annual well check will depend on your age, overall health, lifestyle risk factors, and family history of disease. Counseling  Your health care provider may ask you questions about your: Alcohol use. Tobacco use. Drug use. Emotional well-being. Home and relationship well-being. Sexual activity. Eating habits. History of falls. Memory and ability to understand (cognition). Work and work Astronomer. Screening  You may have the following tests or measurements: Height, weight, and BMI. Blood pressure. Lipid and cholesterol levels. These may be checked every 5 years, or more frequently if you are over 35 years old. Skin check. Lung cancer screening. You may have this screening every year starting at age 10 if you have a 30-pack-year history of smoking and currently smoke or have quit within the past 15 years. Fecal occult blood test (FOBT) of the stool. You may have this test every year starting at age 38. Flexible sigmoidoscopy or colonoscopy. You may have a sigmoidoscopy every 5 years or a colonoscopy every 10 years starting at age 48. Prostate cancer screening. Recommendations will vary depending on your family history and other risks. Hepatitis C blood test. Hepatitis B blood test. Sexually transmitted disease (STD) testing. Diabetes screening. This is done by checking your blood sugar (glucose) after you have not  eaten for a while (fasting). You may have this done every 1-3 years. Abdominal aortic aneurysm (AAA) screening. You may need this if you are a current or former smoker. Osteoporosis. You may be screened starting at age 55 if you are at high risk. Talk with your health care provider about your test results, treatment options, and if necessary, the need for more tests. Vaccines  Your health care provider may recommend  certain vaccines, such as: Influenza vaccine. This is recommended every year. Tetanus, diphtheria, and acellular pertussis (Tdap, Td) vaccine. You may need a Td booster every 10 years. Zoster vaccine. You may need this after age 37. Pneumococcal 13-valent conjugate (PCV13) vaccine. One dose is recommended after age 60. Pneumococcal polysaccharide (PPSV23) vaccine. One dose is recommended after age 52. Talk to your health care provider about which screenings and vaccines you need and how often you need them. This information is not intended to replace advice given to you by your health care provider. Make sure you discuss any questions you have with your health care provider. Document Released: 10/08/2015 Document Revised: 05/31/2016 Document Reviewed: 07/13/2015 Elsevier Interactive Patient Education  2017 [employer].  Fall Prevention in the Home Falls can cause injuries. They can happen to people of all ages. There are many things you can do to make your home safe and to help prevent falls. What can I do on the outside of my home? Regularly fix the edges of walkways and driveways and fix any cracks. Remove anything that might make you trip as you walk through a door, such as a raised step or threshold. Trim any bushes or trees on the path to your home. Use bright outdoor lighting. Clear any walking paths of anything that might make someone trip, such as rocks or tools. Regularly check to see if handrails are loose or broken. Make sure that both sides of any steps have handrails. Any raised decks and porches should have guardrails on the edges. Have any leaves, snow, or ice cleared regularly. Use sand or salt on walking paths during winter. Clean up any spills in your garage right away. This includes oil or grease spills. What can I do in the bathroom? Use night lights. Install grab bars by the toilet and in the tub and shower. Do not use towel bars as grab bars. Use non-skid mats or  decals in the tub or shower. If you need to sit down in the shower, use a plastic, non-slip stool. Keep the floor dry. Clean up any water that spills on the floor as soon as it happens. Remove soap buildup in the tub or shower regularly. Attach bath mats securely with double-sided non-slip rug tape. Do not have throw rugs and other things on the floor that can make you trip. What can I do in the bedroom? Use night lights. Make sure that you have a light by your bed that is easy to reach. Do not use any sheets or blankets that are too big for your bed. They should not hang down onto the floor. Have a firm chair that has side arms. You can use this for support while you get dressed. Do not have throw rugs and other things on the floor that can make you trip. What can I do in the kitchen? Clean up any spills right away. Avoid walking on wet floors. Keep items that you use a lot in easy-to-reach places. If you need to reach something above you, use a strong step stool that  has a grab bar. Keep electrical cords out of the way. Do not use floor polish or wax that makes floors slippery. If you must use wax, use non-skid floor wax. Do not have throw rugs and other things on the floor that can make you trip. What can I do with my stairs? Do not leave any items on the stairs. Make sure that there are handrails on both sides of the stairs and use them. Fix handrails that are broken or loose. Make sure that handrails are as long as the stairways. Check any carpeting to make sure that it is firmly attached to the stairs. Fix any carpet that is loose or worn. Avoid having throw rugs at the top or bottom of the stairs. If you do have throw rugs, attach them to the floor with carpet tape. Make sure that you have a light switch at the top of the stairs and the bottom of the stairs. If you do not have them, ask someone to add them for you. What else can I do to help prevent falls? Wear shoes that: Do not  have high heels. Have rubber bottoms. Are comfortable and fit you well. Are closed at the toe. Do not wear sandals. If you use a stepladder: Make sure that it is fully opened. Do not climb a closed stepladder. Make sure that both sides of the stepladder are locked into place. Ask someone to hold it for you, if possible. Clearly mark and make sure that you can see: Any grab bars or handrails. First and last steps. Where the edge of each step is. Use tools that help you move around (mobility aids) if they are needed. These include: Canes. Walkers. Scooters. Crutches. Turn on the lights when you go into a dark area. Replace any light bulbs as soon as they burn out. Set up your furniture so you have a clear path. Avoid moving your furniture around. If any of your floors are uneven, fix them. If there are any pets around you, be aware of where they are. Review your medicines with your doctor. Some medicines can make you feel dizzy. This can increase your chance of falling. Ask your doctor what other things that you can do to help prevent falls. This information is not intended to replace advice given to you by your health care provider. Make sure you discuss any questions you have with your health care provider. Document Released: 07/08/2009 Document Revised: 02/17/2016 Document Reviewed: 10/16/2014 Elsevier Interactive Patient Education  2017 [employer].

## 2023-03-07 ENCOUNTER — Encounter: Payer: Self-pay | Admitting: Dermatology

## 2023-03-07 ENCOUNTER — Ambulatory Visit: Payer: Medicare Other | Admitting: Dermatology

## 2023-03-07 VITALS — BP 140/70 | HR 60

## 2023-03-07 DIAGNOSIS — L578 Other skin changes due to chronic exposure to nonionizing radiation: Secondary | ICD-10-CM

## 2023-03-07 DIAGNOSIS — L821 Other seborrheic keratosis: Secondary | ICD-10-CM

## 2023-03-07 DIAGNOSIS — L570 Actinic keratosis: Secondary | ICD-10-CM | POA: Diagnosis not present

## 2023-03-07 DIAGNOSIS — W908XXA Exposure to other nonionizing radiation, initial encounter: Secondary | ICD-10-CM

## 2023-03-07 DIAGNOSIS — X32XXXA Exposure to sunlight, initial encounter: Secondary | ICD-10-CM

## 2023-03-07 NOTE — Patient Instructions (Signed)
Cryotherapy Aftercare  Wash gently with soap and water everyday.   Apply Vaseline and Band-Aid daily until healed.    Recommend daily broad spectrum sunscreen SPF 30+ to sun-exposed areas, reapply every 2 hours as needed. Call for new or changing lesions.  Staying in the shade or wearing long sleeves, sun glasses (UVA+UVB protection) and wide brim hats (4-inch brim around the entire circumference of the hat) are also recommended for sun protection.     Due to recent changes in healthcare laws, you may see results of your pathology and/or laboratory studies on MyChart before the doctors have had a chance to review them. We understand that in some cases there may be results that are confusing or concerning to you. Please understand that not all results are received at the same time and often the doctors may need to interpret multiple results in order to provide you with the best plan of care or course of treatment. Therefore, we ask that you please give us 2 business days to thoroughly review all your results before contacting the office for clarification. Should we see a critical lab result, you will be contacted sooner.   If You Need Anything After Your Visit  If you have any questions or concerns for your doctor, please call our main line at [phone] and press option 4 to reach your doctor's medical assistant. If no one answers, please leave a voicemail as directed and we will return your call as soon as possible. Messages left after 4 pm will be answered the following business day.   You may also send us a message via MyChart. We typically respond to MyChart messages within 1-2 business days.  For prescription refills, please ask your pharmacy to contact our office. Our fax number is [phone].  If you have an urgent issue when the clinic is closed that cannot wait until the next business day, you can page your doctor at the number below.    Please note that while we do our best to  be available for urgent issues outside of office hours, we are not available 24/7.   If you have an urgent issue and are unable to reach us, you may choose to seek medical care at your doctor's office, retail clinic, urgent care center, or emergency room.  If you have a medical emergency, please immediately call 911 or go to the emergency department.  Pager Numbers  - Dr. Kowalski: [phone]  - Dr. Moye: [phone]  - Dr. Stewart: [phone]  In the event of inclement weather, please call our main line at [phone] for an update on the status of any delays or closures.  Dermatology Medication Tips: Please keep the boxes that topical medications come in in order to help keep track of the instructions about where and how to use these. Pharmacies typically print the medication instructions only on the boxes and not directly on the medication tubes.   If your medication is too expensive, please contact our office at [phone] option 4 or send us a message through MyChart.   We are unable to tell what your co-pay for medications will be in advance as this is different depending on your insurance coverage. However, we may be able to find a substitute medication at lower cost or fill out paperwork to get insurance to cover a needed medication.   If a prior authorization is required to get your medication covered by your insurance company, please allow us 1-2 business days to complete this process.    Drug prices often vary depending on where the prescription is filled and some pharmacies may offer cheaper prices.  The website www.goodrx.com contains coupons for medications through different pharmacies. The prices here do not account for what the cost may be with help from insurance (it may be cheaper with your insurance), but the website can give you the price if you did not use any insurance.  - You can print the associated coupon and take it with your prescription to the pharmacy.   - You may also stop by our office during regular business hours and pick up a GoodRx coupon card.  - If you need your prescription sent electronically to a different pharmacy, notify our office through [facility] MyChart or by phone at [phone] option 4.     Si Usted Necesita Algo Despus de Su Visita  Tambin puede enviarnos un mensaje a travs de MyChart. Por lo general respondemos a los mensajes de MyChart en el transcurso de 1 a 2 das hbiles.  Para renovar recetas, por favor pida a su farmacia que se ponga en contacto con nuestra oficina. Nuestro nmero de fax es el [phone].  Si tiene un asunto urgente cuando la clnica est cerrada y que no puede esperar hasta el siguiente da hbil, puede llamar/localizar a su doctor(a) al nmero que aparece a continuacin.   Por favor, tenga en cuenta que aunque hacemos todo lo posible para estar disponibles para asuntos urgentes fuera del horario de oficina, no estamos disponibles las 24 horas del da, los 7 das de la semana.   Si tiene un problema urgente y no puede comunicarse con nosotros, puede optar por buscar atencin mdica  en el consultorio de su doctor(a), en una clnica privada, en un centro de atencin urgente o en una sala de emergencias.  Si tiene una emergencia mdica, por favor llame inmediatamente al 911 o vaya a la sala de emergencias.  Nmeros de bper  - Dr. Kowalski: [phone]  - Dra. Moye: [phone]  - Dra. Stewart: [phone]  En caso de inclemencias del tiempo, por favor llame a nuestra lnea principal al [phone] para una actualizacin sobre el estado de cualquier retraso o cierre.  Consejos para la medicacin en dermatologa: Por favor, guarde las cajas en las que vienen los medicamentos de uso tpico para ayudarle a seguir las instrucciones sobre dnde y cmo usarlos. Las farmacias generalmente imprimen las instrucciones del medicamento slo en las cajas y no directamente en los tubos del  medicamento.   Si su medicamento es muy caro, por favor, pngase en contacto con nuestra oficina llamando al [phone] y presione la opcin 4 o envenos un mensaje a travs de MyChart.   No podemos decirle cul ser su copago por los medicamentos por adelantado ya que esto es diferente dependiendo de la cobertura de su seguro. Sin embargo, es posible que podamos encontrar un medicamento sustituto a menor costo o llenar un formulario para que el seguro cubra el medicamento que se considera necesario.   Si se requiere una autorizacin previa para que su compaa de seguros cubra su medicamento, por favor permtanos de 1 a 2 das hbiles para completar este proceso.  Los precios de los medicamentos varan con frecuencia dependiendo del lugar de dnde se surte la receta y alguna farmacias pueden ofrecer precios ms baratos.  El sitio web www.goodrx.com tiene cupones para medicamentos de diferentes farmacias. Los precios aqu no tienen en cuenta lo que podra costar con la ayuda del seguro (puede ser ms   barato con su seguro), pero el sitio web puede darle el precio si no utiliz ningn seguro.  - Puede imprimir el cupn correspondiente y llevarlo con su receta a la farmacia.  - Tambin puede pasar por nuestra oficina durante el horario de atencin regular y recoger una tarjeta de cupones de GoodRx.  - Si necesita que su receta se enve electrnicamente a una farmacia diferente, informe a nuestra oficina a travs de MyChart de [facility] o por telfono llamando al [phone] y presione la opcin 4.  

## 2023-03-07 NOTE — Progress Notes (Signed)
   Follow-Up Visit   Subjective  Joseph Cook is a 82 y.o. male who presents for the following: Spots of concern. Has appointment with Dr. Gwen Pounds in September, did not want to wait that long. Non tender, denies bleeding.   The patient has spots, moles and lesions to be evaluated, some may be new or changing and the patient has concerns that these could be cancer.    The following portions of the chart were reviewed this encounter and updated as appropriate: medications, allergies, medical history  Review of Systems:  No other skin or systemic complaints except as noted in HPI or Assessment and Plan.  Objective  Well appearing patient in no apparent distress; mood and affect are within normal limits.  A focused examination was performed of the following areas: Face, right leg  Relevant physical exam findings are noted in the Assessment and Plan.  Right Pretibial x 2, left preauricular x1, left upper temple x2, left posterior scalp x1 (6) Hyperkeratotic erythematous  papules with gritty scale.     Assessment & Plan   Hypertrophic actinic keratosis (6) Right Pretibial x 2, left preauricular x1, left upper temple x2, left posterior scalp x1  Actinic keratoses are precancerous spots that appear secondary to cumulative UV radiation exposure/sun exposure over time. They are chronic with expected duration over 1 year. A portion of actinic keratoses will progress to squamous cell carcinoma of the skin. It is not possible to reliably predict which spots will progress to skin cancer and so treatment is recommended to prevent development of skin cancer.  Recommend daily broad spectrum sunscreen SPF 30+ to sun-exposed areas, reapply every 2 hours as needed.  Recommend staying in the shade or wearing long sleeves, sun glasses (UVA+UVB protection) and wide brim hats (4-inch brim around the entire circumference of the hat). Call for new or changing lesions.  consider biopsy on right pretibia  if not resolved at next visit.   Destruction of lesion - Right Pretibial x 2, left preauricular x1, left upper temple x2, left posterior scalp x1  Destruction method: cryotherapy   Informed consent: discussed and consent obtained   Lesion destroyed using liquid nitrogen: Yes   Region frozen until ice ball extended beyond lesion: Yes   Outcome: patient tolerated procedure well with no complications   Post-procedure details: wound care instructions given   Additional details:  Prior to procedure, discussed risks of blister formation, small wound, skin dyspigmentation, or rare scar following cryotherapy. Recommend Vaseline ointment to treated areas while healing.   ACTINIC DAMAGE - chronic, secondary to cumulative UV radiation exposure/sun exposure over time - diffuse scaly erythematous macules with underlying dyspigmentation - Recommend daily broad spectrum sunscreen SPF 30+ to sun-exposed areas, reapply every 2 hours as needed.  - Recommend staying in the shade or wearing long sleeves, sun glasses (UVA+UVB protection) and wide brim hats (4-inch brim around the entire circumference of the hat). - Call for new or changing lesions.  SEBORRHEIC KERATOSIS - Stuck-on, waxy, tan plaque at left temple  - Benign-appearing - Discussed benign etiology and prognosis. - Observe - Call for any changes   Return for TBSE As Scheduled.  I, Lawson Radar, CMA, am acting as scribe for Willeen Niece, MD.   Documentation: I have reviewed the above documentation for accuracy and completeness, and I agree with the above.  Willeen Niece, MD

## 2023-03-26 ENCOUNTER — Other Ambulatory Visit: Payer: Self-pay | Admitting: Physician Assistant

## 2023-03-26 DIAGNOSIS — R09A2 Foreign body sensation, throat: Secondary | ICD-10-CM

## 2023-04-04 ENCOUNTER — Ambulatory Visit: Payer: Medicare Other | Attending: Internal Medicine | Admitting: Internal Medicine

## 2023-04-04 ENCOUNTER — Encounter: Payer: Self-pay | Admitting: Internal Medicine

## 2023-04-04 VITALS — BP 140/70 | HR 54 | Ht 70.0 in | Wt 198.1 lb

## 2023-04-04 DIAGNOSIS — E785 Hyperlipidemia, unspecified: Secondary | ICD-10-CM | POA: Diagnosis present

## 2023-04-04 DIAGNOSIS — Z7984 Long term (current) use of oral hypoglycemic drugs: Secondary | ICD-10-CM

## 2023-04-04 DIAGNOSIS — I471 Supraventricular tachycardia, unspecified: Secondary | ICD-10-CM | POA: Diagnosis not present

## 2023-04-04 DIAGNOSIS — I10 Essential (primary) hypertension: Secondary | ICD-10-CM | POA: Insufficient documentation

## 2023-04-04 DIAGNOSIS — I2510 Atherosclerotic heart disease of native coronary artery without angina pectoris: Secondary | ICD-10-CM | POA: Diagnosis not present

## 2023-04-04 DIAGNOSIS — I2584 Coronary atherosclerosis due to calcified coronary lesion: Secondary | ICD-10-CM | POA: Diagnosis present

## 2023-04-04 DIAGNOSIS — E1169 Type 2 diabetes mellitus with other specified complication: Secondary | ICD-10-CM | POA: Insufficient documentation

## 2023-04-04 DIAGNOSIS — I700 Atherosclerosis of aorta: Secondary | ICD-10-CM | POA: Diagnosis not present

## 2023-04-04 MED ORDER — METOPROLOL SUCCINATE ER 50 MG PO TB24: 75.0000 mg | ORAL_TABLET | Freq: Every day | ORAL | 3 refills | Status: DC

## 2023-04-04 MED ORDER — AMLODIPINE BESYLATE 2.5 MG PO TABS: 2.5000 mg | ORAL_TABLET | Freq: Every day | ORAL | 3 refills | Status: DC

## 2023-04-04 NOTE — Progress Notes (Signed)
Cardiology Office Note:  .   Date:  04/04/2023  ID:  Abbott Pao, DOB 01-07-1941, MRN [id number] PCP: Alfredia Ferguson, PA-C  [facility] Providers Cardiologist:  Yvonne Kendall, MD Electrophysiologist:  Lanier Prude, MD     History of Present Illness: .   Joseph Cook is a 82 y.o. male with history of coronary artery calcification, aortic atherosclerosis, PSVT, hypertension, hyperlipidemia, and type 2 diabetes mellitus, who presents for follow-up of SVT and hypertension.  I last saw him in April, at which time he reported brief palpitations a few times a week without associated symptoms.  He also noted chronic lower extremity edema (right greater than left) which he felt was stable but his wife thought had worsened.  Blood pressure was also mildly elevated.  We agreed to work on lifestyle modifications to help improve blood pressure and lower extremity edema.  Today, Mr. Gilkes reports that he has been feeling fairly well.  He denies chest pain, shortness of breath, and lightheadedness.  Chronic asymmetric lower extremity edema (right greater than left) is unchanged from prior visits.  He notes only very rare palpitations without associated symptoms.  He is trying to walk some at home and play golf, though he has not been able to do as much of this recently on account of the summer heat.  He reports that home blood pressure readings are typically similar to what we obtained in the office today.  ROS: See HPI  Studies Reviewed: Marland Kitchen   EKG Interpretation Date/Time:  Wednesday April 04 2023 09:13:44 EDT Ventricular Rate:  54 PR Interval:  214 QRS Duration:  110 QT Interval:  436 QTC Calculation: 413 R Axis:   -65  Text Interpretation: Sinus bradycardia with 1st degree A-V block Left anterior fascicular block When compared with ECG of 15-Sep-2022 PR interval has increased Confirmed by Erlean Mealor [phone]) on 04/04/2023 2:58:02 PM    Pharmacologic MPI (01/12/2022): Low  risk study without ischemia or scar.  LVEF 55%.  Coronary artery calcification and aortic atherosclerosis noted.  TEE (01/06/2022): LVEF 60-65%.  Normal RV size and function.  Structurally normal mitral valve with mild regurgitation.  Tricuspid aortic valve with severe calcification of the noncoronary leaflet.  No aortic regurgitation.  No significant stenosis.  No left atrial/left atrial appendage thrombus identified.  Moderate atheromatous plaque involving the aortic arch and descending aorta.  TTE (11/15/2021): LVEF 65-70% with moderate LVH and grade 1 diastolic dysfunction.  Normal RV size and function.  Mild biatrial enlargement.  Question congenitally malformed aortic valve with moderate calcification.  Aortic sclerosis without stenosis evident.  Risk Assessment/Calculations:     HYPERTENSION CONTROL Vitals:   04/04/23 0908 04/04/23 0915  BP: (!) 150/80 (!) 140/70    The patient's blood pressure is elevated above target today. In order to address the patient's elevated BP: A new medication was prescribed today.       Physical Exam:   VS:  BP (!) 140/70 (BP Location: Left Arm, Patient Position: Sitting, Cuff Size: Normal)   Pulse (!) 54   Ht 5\' 10"  (1.778 m)   Wt 198 lb 2 oz (89.9 kg)   SpO2 98%   BMI 28.43 kg/m    Wt Readings from Last 3 Encounters:  04/04/23 198 lb 2 oz (89.9 kg)  02/26/23 191 lb (86.6 kg)  02/06/23 191 lb 12.8 oz (87 kg)    General:  NAD. Neck: No JVD or HJR. Lungs: Clear to auscultation bilaterally without wheezes or crackles.  Heart: Bradycardic but regular rhythm with 2/6 systolic murmur.  No rubs or gallops. Abdomen: Soft, nontender, nondistended. Extremities: 1+ right and trace left pretibial edema.  ASSESSMENT AND PLAN: .    Supraventricular tachycardia: Palpitations have been well-controlled, improved with prior escalation of metoprolol.  However, EKG today shows lengthening of the PR interval.  On account of this, we have agreed to decrease  metoprolol succinate to 75 mg daily and have Mr. Mestre return in about 6 weeks for repeat EKG.  If he has recrudescence of palpitations with de-escalation of metoprolol, he will need to see Dr. Lalla Brothers again for additional recommendations.  Hypertension: Blood pressure suboptimally controlled today on max doses of lisinopril-HCTZ.  We are also forced to reduce metoprolol today on account of his developing first-degree AV block.  We have agreed to add amlodipine 2.5 mg daily with close monitoring of his chronic lower extremity edema.  I reinforced importance of sodium restriction.  Coronary artery calcification and aortic atherosclerosis: No angina reported.  Continue atorvastatin to prevent progression of disease.  Reduction of aspirin from 325 to 81 mg daily previously recommended.  Hyperlipidemia associated with type 2 diabetes mellitus: Lipids at goal on last check in November other than slightly low HDL.  Continue current medications.  Ongoing management of DM per PCP.    Dispo: Return to clinic in 6 weeks for EKG and BP check.  Signed, Yvonne Kendall, MD

## 2023-04-04 NOTE — Patient Instructions (Signed)
Medication Instructions:  Your physician recommends the following medication changes.  START TAKING: Amlodipine 2.5 mg by mouth daily  DECREASE: Metoprolol Succinate to 75 mg (1 1/2 tablet) by mouth daily   *If you need a refill on your cardiac medications before your next appointment, please call your pharmacy*   Lab Work: None ordered today   Testing/Procedures: None ordered today   Follow-Up: At [facility], you and your health needs are our priority.  As part of our continuing mission to provide you with exceptional heart care, we have created designated Provider Care Teams.  These Care Teams include your primary Cardiologist (physician) and Advanced Practice Providers (APPs -  Physician Assistants and Nurse Practitioners) who all work together to provide you with the care you need, when you need it.  We recommend signing up for the patient portal called "MyChart".  Sign up information is provided on this After Visit Summary.  MyChart is used to connect with patients for Virtual Visits (Telemedicine).  Patients are able to view lab/test results, encounter notes, upcoming appointments, etc.  Non-urgent messages can be sent to your provider as well.   To learn more about what you can do with MyChart, go to [email or web address].    Your next appointment:   6 week(s)  Provider:   You may see Yvonne Kendall, MD or one of the following Advanced Practice Providers on your designated Care Team:   Nicolasa Ducking, NP Eula Listen, PA-C Cadence Fransico Michael, PA-C Charlsie Quest, NP

## 2023-05-07 IMAGING — CR DG CHEST 2V
1 series · 2 of 2 positions shown · non-contrast
Comparison: None.

CLINICAL DATA: Palpitations

EXAM:
CHEST - 2 VIEW

[Series 1: dg chest 2 view · 0.14mm/px · 2 of 2 slices shown]
[im 1/2]
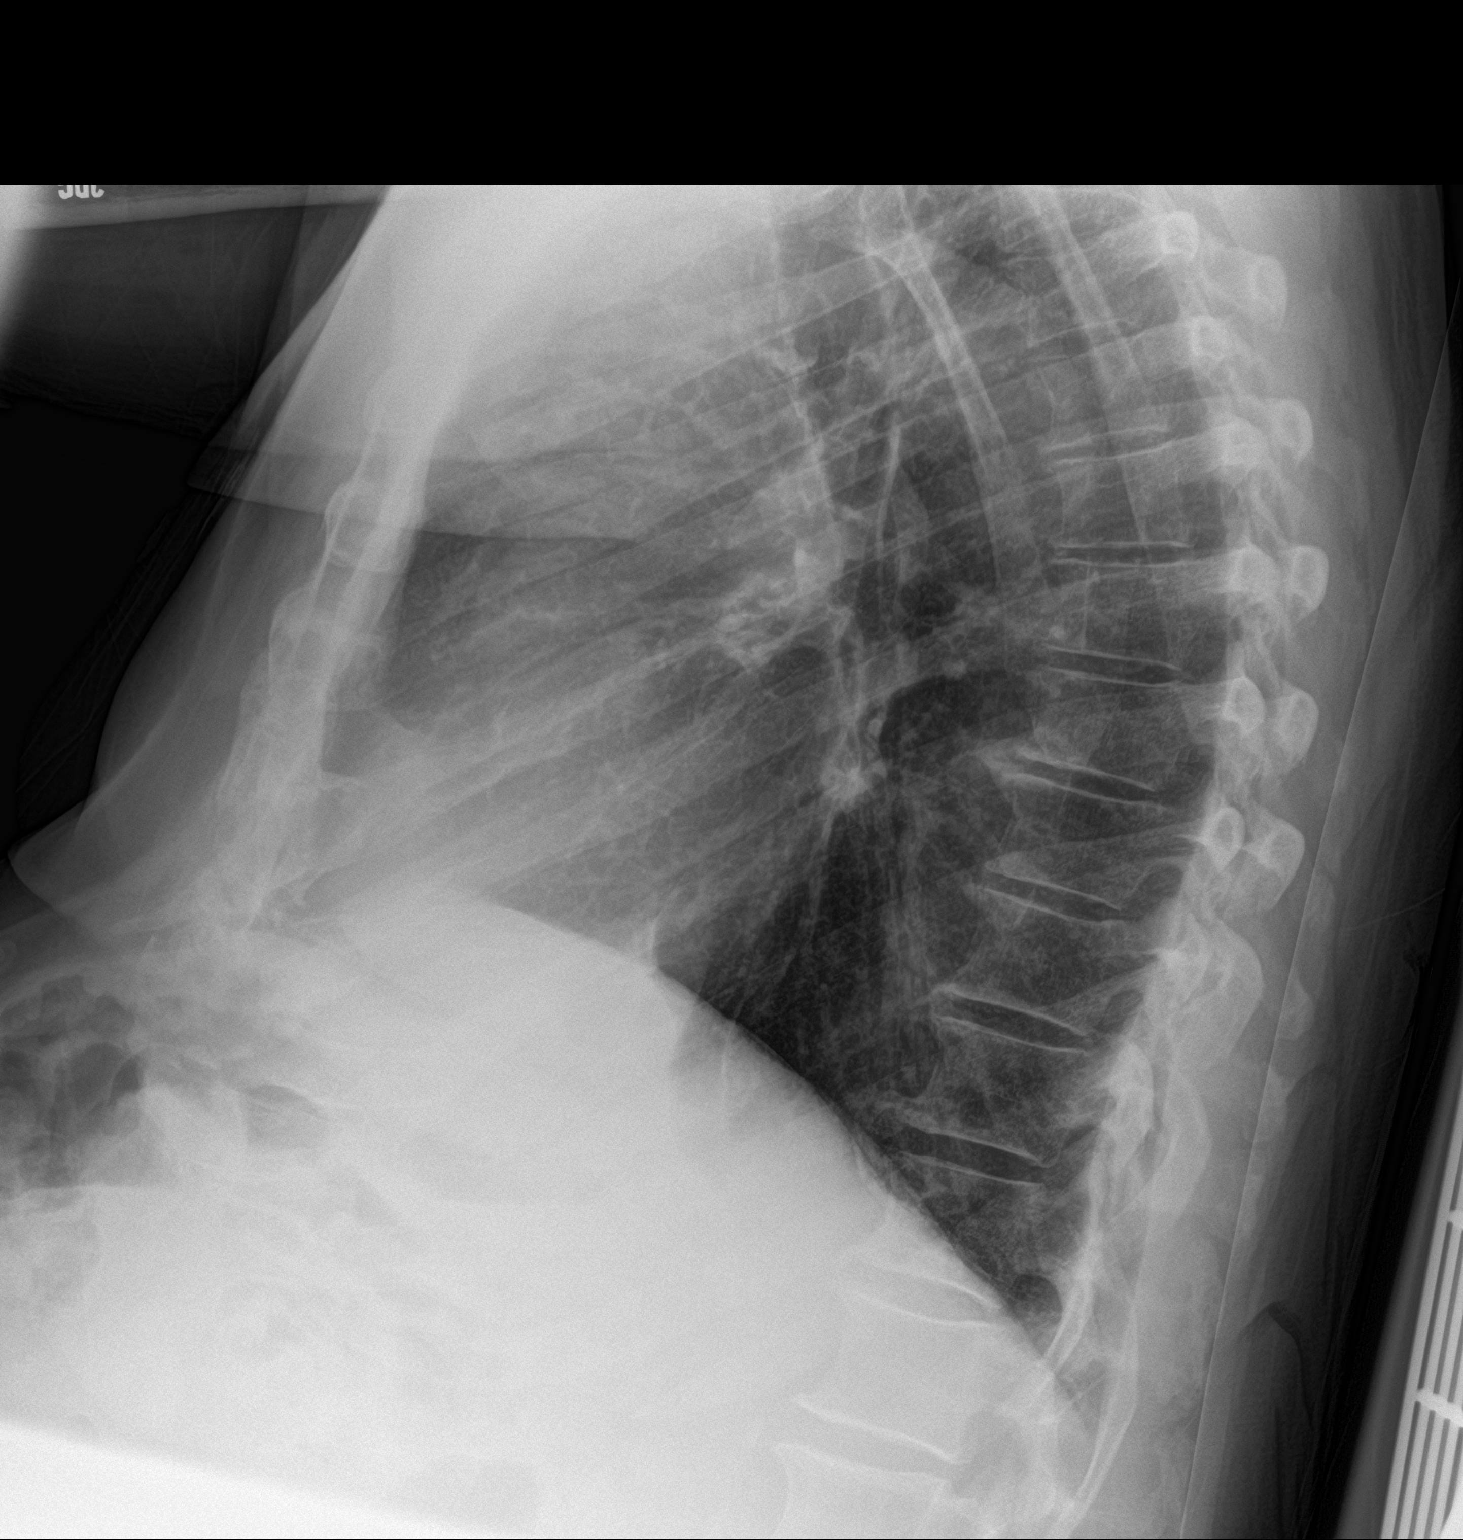
[im 2/2]
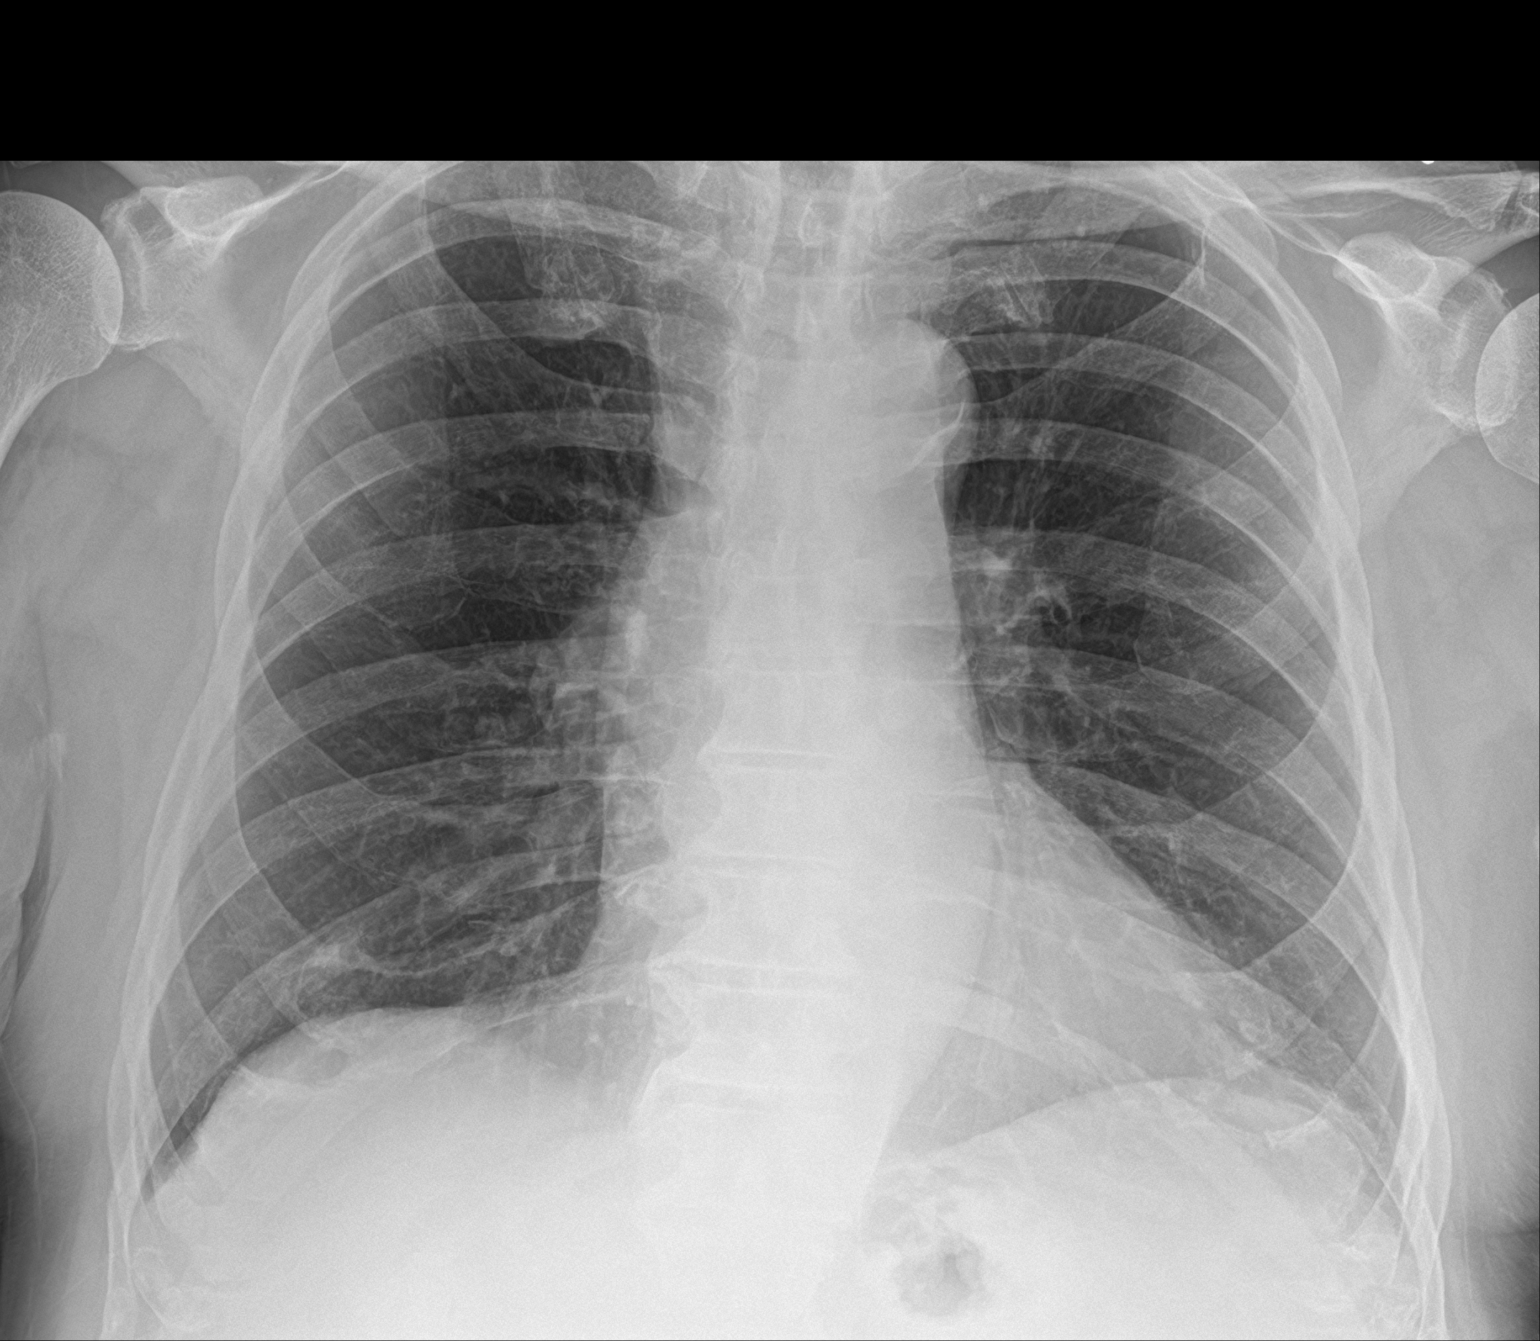

[2 of 2 positions shown; findings below may reference images not displayed]

FINDINGS: The heart size and mediastinal contours are within normal limits.
Probable mild scarring at the right lung base. No pleural effusion
or pneumothorax. The visualized skeletal structures are
unremarkable.
IMPRESSION: No acute process in the chest.

## 2023-05-31 ENCOUNTER — Ambulatory Visit: Payer: 59 | Admitting: Dermatology

## 2023-06-08 ENCOUNTER — Ambulatory Visit: Payer: Medicare Other | Attending: Internal Medicine | Admitting: Internal Medicine

## 2023-06-08 ENCOUNTER — Encounter: Payer: Self-pay | Admitting: Internal Medicine

## 2023-06-08 VITALS — BP 122/80 | HR 60 | Ht 70.0 in | Wt 200.5 lb

## 2023-06-08 DIAGNOSIS — I10 Essential (primary) hypertension: Secondary | ICD-10-CM | POA: Insufficient documentation

## 2023-06-08 DIAGNOSIS — E785 Hyperlipidemia, unspecified: Secondary | ICD-10-CM | POA: Diagnosis present

## 2023-06-08 DIAGNOSIS — I700 Atherosclerosis of aorta: Secondary | ICD-10-CM | POA: Diagnosis not present

## 2023-06-08 DIAGNOSIS — E1169 Type 2 diabetes mellitus with other specified complication: Secondary | ICD-10-CM | POA: Diagnosis present

## 2023-06-08 DIAGNOSIS — I2510 Atherosclerotic heart disease of native coronary artery without angina pectoris: Secondary | ICD-10-CM | POA: Diagnosis present

## 2023-06-08 DIAGNOSIS — I2584 Coronary atherosclerosis due to calcified coronary lesion: Secondary | ICD-10-CM | POA: Insufficient documentation

## 2023-06-08 DIAGNOSIS — I493 Ventricular premature depolarization: Secondary | ICD-10-CM

## 2023-06-08 DIAGNOSIS — I471 Supraventricular tachycardia, unspecified: Secondary | ICD-10-CM | POA: Diagnosis not present

## 2023-06-08 NOTE — Patient Instructions (Signed)
Medication Instructions:  Your physician recommends that you continue on your current medications as directed. Please refer to the Current Medication list given to you today.   *If you need a refill on your cardiac medications before your next appointment, please call your pharmacy*   Lab Work: No labs ordered today    Testing/Procedures: No test ordered today    Follow-Up: At [facility], you and your health needs are our priority.  As part of our continuing mission to provide you with exceptional heart care, we have created designated Provider Care Teams.  These Care Teams include your primary Cardiologist (physician) and Advanced Practice Providers (APPs -  Physician Assistants and Nurse Practitioners) who all work together to provide you with the care you need, when you need it.  We recommend signing up for the patient portal called "MyChart".  Sign up information is provided on this After Visit Summary.  MyChart is used to connect with patients for Virtual Visits (Telemedicine).  Patients are able to view lab/test results, encounter notes, upcoming appointments, etc.  Non-urgent messages can be sent to your provider as well.   To learn more about what you can do with MyChart, go to [email or web address].    Your next appointment:   1 year(s)  Provider:   You may see Yvonne Kendall, MD or one of the following Advanced Practice Providers on your designated Care Team:   Nicolasa Ducking, NP Eula Listen, PA-C Cadence Fransico Michael, PA-C Charlsie Quest, NP

## 2023-06-08 NOTE — Progress Notes (Unsigned)
Cardiology Office Note:  .   Date:  06/09/2023  ID:  Joseph Cook, DOB 1940-11-21, MRN [id number] PCP: Sherlyn Hay, DO  [facility] Providers Cardiologist:  Yvonne Kendall, MD Electrophysiologist:  Lanier Prude, MD     History of Present Illness: .   Joseph Cook is a 82 y.o. male history of coronary artery calcification, aortic atherosclerosis, PSVT, hypertension, hyperlipidemia, type 2 diabetes mellitus, presents for follow-up of SVT and hypertension.  I last saw him in July, at which time he was feeling well.  Chronic asymmetric lower extremity edema was unchanged.  He noted only very rare palpitations without associated symptoms.  EKG showed interval prolongation of the PR interval.  He was also noted to be somewhat hypertensive.  We agreed to decrease metoprolol succinate to 75 mg daily and add amlodipine 2.5 mg daily.  Today, Joseph Cook reports that he is feeling well. He is tolerating amlodipine well.  Chronic lower extremity edema (R>L) is stable.  He denies chest pain, shortness of breath, palpitations, and lightheadedness.  Home BP's are usually 130's/80's using a wrist cuff.  ROS: See HPI  Studies Reviewed: Marland Kitchen   EKG Interpretation Date/Time:  Friday June 08 2023 09:12:00 EDT Ventricular Rate:  60 PR Interval:  206 QRS Duration:  108 QT Interval:  446 QTC Calculation: 446 R Axis:   -61  Text Interpretation: Sinus rhythm with first degree AV block with Premature atrial complexes Left anterior fasicular block Abnormal ECG When compared with ECG of 04-Apr-2023 09:13, Premature atrial complexes are now Present Confirmed by Pebble Botkin, Cristal Deer [phone]) on 06/09/2023 2:12:43 PM    Pharmacologic MPI (01/12/2022): Low risk study without ischemia or scar.  LVEF 55%.  Coronary artery calcification and aortic atherosclerosis noted.   TEE (01/06/2022): LVEF 60-65%.  Normal RV size and function.  Structurally normal mitral valve with mild regurgitation.  Tricuspid  aortic valve with severe calcification of the noncoronary leaflet.  No aortic regurgitation.  No significant stenosis.  No left atrial/left atrial appendage thrombus identified.  Moderate atheromatous plaque involving the aortic arch and descending aorta.   TTE (11/15/2021): LVEF 65-70% with moderate LVH and grade 1 diastolic dysfunction.  Normal RV size and function.  Mild biatrial enlargement.  Question congenitally malformed aortic valve with moderate calcification.  Aortic sclerosis without stenosis evident.  Risk Assessment/Calculations:             Physical Exam:   VS:  BP 122/80 (BP Location: Left Arm, Patient Position: Sitting, Cuff Size: Normal)   Pulse 60   Ht 5\' 10"  (1.778 m)   Wt 200 lb 8 oz (90.9 kg)   SpO2 98%   BMI 28.77 kg/m    Wt Readings from Last 3 Encounters:  06/08/23 200 lb 8 oz (90.9 kg)  04/04/23 198 lb 2 oz (89.9 kg)  02/26/23 191 lb (86.6 kg)    General:  NAD. Neck: No JVD or HJR. Lungs: Clear to auscultation bilaterally without wheezes or crackles. Heart: Bradycardic but regular with 2/6 systolic murmur. Abdomen: Soft, nontender, nondistended. Extremities: 2+ right, trace left LE edema.  ASSESSMENT AND PLAN: .    Hypertension: BP improved today.  Continue current medications.  First degree AV block: Mild PR prolongation again noted, slightly less than at last visit.  Continue current dose of metoprolol with repeat EKG at follow-up.  PSVT: No palpitations reported.  Continue metoprolol.  Coronary artery calcification and aortic atherosclerosis: Continue medical therapy and lifestyle modifications to prevent progression of  disease.  Hyperlipidemia associated with type 2 DM: Continue low-dose atorvastatin.  Ongoing management of DM per PCP.    Dispo: Return to clinic in 1 year.  Signed, Yvonne Kendall, MD

## 2023-06-09 ENCOUNTER — Encounter: Payer: Self-pay | Admitting: Internal Medicine

## 2023-06-20 ENCOUNTER — Other Ambulatory Visit: Payer: Self-pay | Admitting: Physician Assistant

## 2023-06-20 DIAGNOSIS — E119 Type 2 diabetes mellitus without complications: Secondary | ICD-10-CM

## 2023-07-07 ENCOUNTER — Other Ambulatory Visit: Payer: Self-pay | Admitting: Physician Assistant

## 2023-07-07 DIAGNOSIS — E7800 Pure hypercholesterolemia, unspecified: Secondary | ICD-10-CM

## 2023-07-12 ENCOUNTER — Other Ambulatory Visit: Payer: Self-pay | Admitting: Physician Assistant

## 2023-08-15 ENCOUNTER — Ambulatory Visit: Payer: Medicare Other | Admitting: Family Medicine

## 2023-08-15 VITALS — BP 139/76 | HR 59 | Ht 70.0 in | Wt 203.3 lb

## 2023-08-15 DIAGNOSIS — Z23 Encounter for immunization: Secondary | ICD-10-CM | POA: Diagnosis not present

## 2023-08-15 DIAGNOSIS — R0982 Postnasal drip: Secondary | ICD-10-CM

## 2023-08-15 DIAGNOSIS — I10 Essential (primary) hypertension: Secondary | ICD-10-CM | POA: Diagnosis not present

## 2023-08-15 DIAGNOSIS — Z79899 Other long term (current) drug therapy: Secondary | ICD-10-CM

## 2023-08-15 DIAGNOSIS — Z7984 Long term (current) use of oral hypoglycemic drugs: Secondary | ICD-10-CM | POA: Diagnosis not present

## 2023-08-15 DIAGNOSIS — G8929 Other chronic pain: Secondary | ICD-10-CM

## 2023-08-15 DIAGNOSIS — E1169 Type 2 diabetes mellitus with other specified complication: Secondary | ICD-10-CM | POA: Diagnosis not present

## 2023-08-15 DIAGNOSIS — E119 Type 2 diabetes mellitus without complications: Secondary | ICD-10-CM

## 2023-08-15 DIAGNOSIS — M545 Low back pain, unspecified: Secondary | ICD-10-CM

## 2023-08-15 DIAGNOSIS — E785 Hyperlipidemia, unspecified: Secondary | ICD-10-CM

## 2023-08-15 MED ORDER — AMLODIPINE BESYLATE 5 MG PO TABS: 5.0000 mg | ORAL_TABLET | Freq: Every day | ORAL | 3 refills | Status: DC

## 2023-08-15 NOTE — Assessment & Plan Note (Signed)
On omeprazole and metformin

## 2023-08-15 NOTE — Progress Notes (Signed)
Established patient visit   Patient: Joseph Cook   DOB: Oct 30, 1940   82 y.o. Male  MRN: [id number] Visit Date: 08/15/2023  Today's healthcare provider: Sherlyn Hay, DO   Chief Complaint  Patient presents with   Medical Management of Chronic Issues    6 month follow-up. Taking medications as prescribed. NO symptoms to report with home blood sugars ranging around 160 mg/dL and bp [id number]   Subjective    HPI Last mAWV: 02/26/2023   The patient, Joseph Cook, presents for a standard follow-up visit. The patient's cardiology follow-up in September went well with a recommendation for annual follow-up.   The patient reports a persistent morning issue of postnasal drip causing throat clearing, which he finds irritating. He does not take any allergy medication.   His blood sugars have been mildly elevated, averaging in the 160s, which he finds frustrating. He tries to watch his diet but does not strictly limit carbohydrates fto allow for increased quality of life given his age. The patient's last A1c was 6.9.  The patient reports lower back pain diagnosed as arthritis. He also reports a pain in his right side, which has been investigated with an ultrasound within the last year but no cause was found. The patient has a history of SVTand was hospitalized in February of the previous year due to a severe episode. His cardiologist changed his beta blocker, which seems to have helped control the SVT. The patient also reports edema in both legs, more severe in the right leg, which developed suddenly about 20 years ago along with a rash on his thigh. He also has pain in his sacroiliac joint, for which he has received injections with mixed results.  The patient's blood pressure has been a bit high, with readings in the 130s/70s at home. His cardiologist changed his blood pressure medications during his visit two months ago. The patient is on metoprolol XL 50mg , lisinopril-hydrochlorothiazide  20-12.5mg  two tablets daily, and amlodipine 2.5mg  daily.     Medications: Outpatient Medications Prior to Visit  Medication Sig   aspirin 325 MG tablet Take 325 mg by mouth daily.   atorvastatin (LIPITOR) 10 MG tablet TAKE 1 TABLET BY MOUTH EVERYDAY AT BEDTIME   Blood Glucose Monitoring Suppl (ONE TOUCH ULTRA 2) w/Device KIT USE TO CHECK BLOOD SUGAR UP TO 4 TIMES DAILY   Cholecalciferol (VITAMIN D3) 50 MCG (2000 UT) TABS Take 2,000 Units by mouth daily.    glucose blood (ONETOUCH ULTRA) test strip Use to test blood sugar once a day.   Lancets (ONETOUCH DELICA PLUS LANCET33G) MISC USE TO TEST BLOOD SUGAR AT LEAST ONCE DAILY   lisinopril-hydrochlorothiazide (ZESTORETIC) 20-12.5 MG tablet Take 2 tablets by mouth daily.   metFORMIN (GLUCOPHAGE-XR) 750 MG 24 hr tablet TAKE 1 TABLET BY MOUTH EVERY DAY WITH BREAKFAST   metoprolol succinate (TOPROL-XL) 50 MG 24 hr tablet Take 1.5 tablets (75 mg total) by mouth daily. Take with or immediately following a meal.   Multiple Vitamins-Minerals (PRESERVISION AREDS 2 PO) Take 1 capsule by mouth 2 (two) times daily.    mupirocin ointment (BACTROBAN) 2 % Apply to skin qd-bid   omeprazole (PRILOSEC) 40 MG capsule TAKE 1 CAPSULE BY MOUTH EVERY DAY   tamsulosin (FLOMAX) 0.4 MG CAPS capsule TAKE 1 CAPSULE BY MOUTH EVERY DAY   [DISCONTINUED] amLODipine (NORVASC) 2.5 MG tablet Take 1 tablet (2.5 mg total) by mouth daily.   No facility-administered medications prior to visit.    Review  of Systems  Constitutional:  Negative for appetite change, chills, fatigue and fever.  HENT:  Positive for postnasal drip. Negative for congestion, ear pain, hearing loss, nosebleeds and trouble swallowing.   Eyes:  Negative for pain and visual disturbance.  Respiratory:  Negative for cough, chest tightness and shortness of breath.   Cardiovascular:  Negative for chest pain, palpitations and leg swelling.  Gastrointestinal:  Negative for abdominal pain, blood in stool,  constipation, diarrhea, nausea and vomiting.  Endocrine: Negative for polydipsia, polyphagia and polyuria.  Genitourinary:  Negative for dysuria and flank pain.  Musculoskeletal:  Positive for back pain (low). Negative for arthralgias, joint swelling, myalgias and neck stiffness.  Skin:  Negative for color change, rash and wound.  Neurological:  Negative for dizziness, tremors, seizures, speech difficulty, weakness, light-headedness and headaches.  Psychiatric/Behavioral:  Negative for behavioral problems, confusion, decreased concentration, dysphoric mood and sleep disturbance. The patient is not nervous/anxious.   All other systems reviewed and are negative.       Objective    BP 139/76 Comment: home reading around 8:30 am  Pulse (!) 59   Ht 5\' 10"  (1.778 m)   Wt 203 lb 4.8 oz (92.2 kg)   SpO2 100%   BMI 29.17 kg/m     Physical Exam Vitals reviewed.  Constitutional:      General: He is not in acute distress.    Appearance: Normal appearance. He is not diaphoretic.  HENT:     Head: Normocephalic and atraumatic.  Eyes:     General: No scleral icterus.    Conjunctiva/sclera: Conjunctivae normal.  Cardiovascular:     Rate and Rhythm: Regular rhythm. Bradycardia present.     Pulses: Normal pulses.          Dorsalis pedis pulses are 2+ on the right side and 2+ on the left side.       Posterior tibial pulses are 2+ on the right side and 2+ on the left side.     Heart sounds: Murmur (2/6 systolic murmur  2nd sternocostal space right sternal border) heard.  Pulmonary:     Effort: Pulmonary effort is normal. No respiratory distress.     Breath sounds: Normal breath sounds. No wheezing or rhonchi.  Musculoskeletal:     Right lower leg: No edema.     Left lower leg: No edema.     Right foot: Normal range of motion. No deformity, bunion, Charcot foot, foot drop or prominent metatarsal heads.     Left foot: Normal range of motion. No deformity, bunion, Charcot foot, foot drop or  prominent metatarsal heads.  Feet:     Right foot:     Protective Sensation: 10 sites tested.  10 sites sensed.     Skin integrity: No ulcer, blister, skin breakdown, erythema, warmth, callus, dry skin or fissure.     Toenail Condition: Right toenails are normal.     Left foot:     Protective Sensation: 10 sites tested.  10 sites sensed.     Skin integrity: No ulcer, blister, skin breakdown, erythema, warmth, callus, dry skin or fissure.     Toenail Condition: Left toenails are normal.  Skin:    General: Skin is warm and dry.     Findings: No rash.  Neurological:     Mental Status: He is alert and oriented to person, place, and time. Mental status is at baseline.  Psychiatric:        Mood and Affect: Mood normal.  Behavior: Behavior normal.      Results for orders placed or performed in visit on 08/15/23  Microalbumin / creatinine urine ratio  Result Value Ref Range   Creatinine, Urine 137.7 Not Estab. mg/dL   Microalbumin, Urine 9.8 Not Estab. ug/mL   Microalb/Creat Ratio 7 0 - 29 mg/g creat  Comprehensive metabolic panel  Result Value Ref Range   Glucose 170 (H) 70 - 99 mg/dL   BUN 22 8 - 27 mg/dL   Creatinine, Ser 1.47 0.76 - 1.27 mg/dL   eGFR 88 >82 NF/AOZ/3.08   BUN/Creatinine Ratio 27 (H) 10 - 24   Sodium 141 134 - 144 mmol/L   Potassium 4.1 3.5 - 5.2 mmol/L   Chloride 103 96 - 106 mmol/L   CO2 22 20 - 29 mmol/L   Calcium 9.1 8.6 - 10.2 mg/dL   Total Protein 6.1 6.0 - 8.5 g/dL   Albumin 4.3 3.7 - 4.7 g/dL   Globulin, Total 1.8 1.5 - 4.5 g/dL   Bilirubin Total 0.6 0.0 - 1.2 mg/dL   Alkaline Phosphatase 47 44 - 121 IU/L   AST 10 0 - 40 IU/L   ALT 20 0 - 44 IU/L  Hemoglobin A1c  Result Value Ref Range   Hgb A1c MFr Bld 8.2 (H) 4.8 - 5.6 %   Est. average glucose Bld gHb Est-mCnc 189 mg/dL  Lipid panel  Result Value Ref Range   Cholesterol, Total 115 100 - 199 mg/dL   Triglycerides 657 0 - 149 mg/dL   HDL 35 (L) >84 mg/dL   VLDL Cholesterol Cal 24 5 - 40  mg/dL   LDL Chol Calc (NIH) 56 0 - 99 mg/dL   Chol/HDL Ratio 3.3 0.0 - 5.0 ratio  Vitamin B12  Result Value Ref Range   Vitamin B-12 243 232 - 1,245 pg/mL    Assessment & Plan    Essential hypertension Assessment & Plan: Home blood pressure readings in the 130s/70s with occasional higher readings. Cardiologist recently adjusted medications. Current regimen: metoprolol XL 75 mg daily, lisinopril 40 mg daily, hydrochlorothiazide 25 mg daily, and amlodipine 2.5 mg daily. Discussed increasing amlodipine to 5 mg daily to improve control. Explained potential side effects and importance of monitoring for dizziness or hypotension. - Increase amlodipine to 5 mg daily - Monitor blood pressure at home and report if consistently low or if experiencing dizziness  Orders: -     Comprehensive metabolic panel -     amLODIPine Besylate; Take 1 tablet (5 mg total) by mouth daily.  Dispense: 90 tablet; Refill: 3  Controlled type 2 diabetes mellitus without complication, without long-term current use of insulin (HCC) -     Microalbumin / creatinine urine ratio -     Hemoglobin A1c -     Lipid panel  Immunization due -     Flu Vaccine Trivalent High Dose (Fluad)  Influenza vaccine needed -     Flu Vaccine Trivalent High Dose (Fluad)  Type 2 diabetes mellitus associated with Hyperlipidemia (HCC) Assessment & Plan: Blood sugars averaging in the 160s. Last A1c was 6.9%. Challenges with carbohydrate avoidance discussed in the context of quality of life. - Order A1c test - Check kidney function, liver function, electrolytes, and cholesterol panel - Check vitamin B12 level due to long-term metformin use   High risk medication use Assessment & Plan: On omeprazole and metformin  Orders: -     Vitamin B12  Chronic bilateral low back pain without sciatica Assessment & Plan: Intermittent  pain due to arthritis, exacerbated by physical activity such as golf. Discussed benefits of stretching and back  strengthening exercises. - Recommend stretching and back strengthening exercises   Post-nasal drip  Postnasal Drip Morning throat clearing likely due to postnasal drip. No current allergy medications. Discussed potential benefits of using a humidifier at night and ensuring adequate hydration. - Consider using a humidifier at night - Ensure adequate hydration  Sacroiliac Joint Pain Intermittent sacroiliac joint pain with limited relief from previous injections. Discussed monitoring and potential for further interventions if pain becomes unbearable. - Continue monitoring and consider further interventions if pain becomes unbearable  Bilateral Lower Extremity Edema Chronic bilateral leg edema, more pronounced in the right leg. No associated pain or tenderness. Discussed monitoring for changes or complications. - Stable - Continue monitoring for changes or complications  General Health Maintenance Due for tetanus and pneumonia vaccines. Declined further COVID-19 vaccinations after three doses. Discussed benefits of Tdap and Prevnar 20 vaccines. - Recommend Tdap vaccine - Recommend Prevnar 20 vaccine - Check with pharmacy for vaccine availability and coverage - Schedule eye exam  Return in about 6 months (around 02/12/2024) for DM, HTN.      I discussed the assessment and treatment plan with the patient  The patient was provided an opportunity to ask questions and all were answered. The patient agreed with the plan and demonstrated an understanding of the instructions.   The patient was advised to call back or seek an in-person evaluation if the symptoms worsen or if the condition fails to improve as anticipated.  Total time was 45 minutes. That includes chart review before the visit, the actual patient visit, and time spent on documentation after the visit.    Sherlyn Hay, DO  [facility] [phone] (phone) [phone] (fax)  [facility]

## 2023-08-15 NOTE — Patient Instructions (Addendum)
Recommended vaccines: - Prevnar-20 - pneumonia - Tdap - tetanus, diphtheria, and pertussis

## 2023-08-16 ENCOUNTER — Ambulatory Visit: Payer: Medicare Other | Admitting: Physician Assistant

## 2023-08-16 ENCOUNTER — Ambulatory Visit: Payer: Medicare Other | Admitting: Family Medicine

## 2023-08-16 LAB — LIPID PANEL
Chol/HDL Ratio: 3.3 ratio (ref 0.0–5.0)
Cholesterol, Total: 115 mg/dL (ref 100–199)
HDL: 35 mg/dL — ABNORMAL LOW (ref >39)
LDL Chol Calc (NIH): 56 mg/dL (ref 0–99)
Triglycerides: 133 mg/dL (ref 0–149)
VLDL Cholesterol Cal: 24 mg/dL (ref 5–40)

## 2023-08-16 LAB — COMPREHENSIVE METABOLIC PANEL
ALT: 20 [IU]/L (ref 0–44)
AST: 10 [IU]/L (ref 0–40)
Albumin: 4.3 g/dL (ref 3.7–4.7)
Alkaline Phosphatase: 47 [IU]/L (ref 44–121)
BUN/Creatinine Ratio: 27 — ABNORMAL HIGH (ref 10–24)
BUN: 22 mg/dL (ref 8–27)
Bilirubin Total: 0.6 mg/dL (ref 0.0–1.2)
CO2: 22 mmol/L (ref 20–29)
Calcium: 9.1 mg/dL (ref 8.6–10.2)
Chloride: 103 mmol/L (ref 96–106)
Creatinine, Ser: 0.81 mg/dL (ref 0.76–1.27)
Globulin, Total: 1.8 g/dL (ref 1.5–4.5)
Glucose: 170 mg/dL — ABNORMAL HIGH (ref 70–99)
Potassium: 4.1 mmol/L (ref 3.5–5.2)
Sodium: 141 mmol/L (ref 134–144)
Total Protein: 6.1 g/dL (ref 6.0–8.5)
eGFR: 88 mL/min/{1.73_m2} (ref >59)

## 2023-08-16 LAB — HEMOGLOBIN A1C
Est. average glucose Bld gHb Est-mCnc: 189 mg/dL
Hgb A1c MFr Bld: 8.2 % — ABNORMAL HIGH (ref 4.8–5.6)

## 2023-08-16 LAB — MICROALBUMIN / CREATININE URINE RATIO
Creatinine, Urine: 137.7 mg/dL
Microalb/Creat Ratio: 7 mg/g{creat} (ref 0–29)
Microalbumin, Urine: 9.8 ug/mL

## 2023-08-16 LAB — VITAMIN B12: Vitamin B-12: 243 pg/mL (ref 232–1245)

## 2023-08-30 ENCOUNTER — Encounter: Payer: Self-pay | Admitting: Family Medicine

## 2023-08-30 ENCOUNTER — Telehealth: Payer: Self-pay

## 2023-08-30 DIAGNOSIS — E119 Type 2 diabetes mellitus without complications: Secondary | ICD-10-CM

## 2023-08-30 MED ORDER — METFORMIN HCL 500 MG PO TABS: 500.0000 mg | ORAL_TABLET | Freq: Two times a day (BID) | ORAL | 1 refills | Status: DC

## 2023-08-30 NOTE — Addendum Note (Signed)
Addended by: Lily Kocher on: 08/30/2023 01:07 PM   Modules accepted: Orders

## 2023-08-30 NOTE — Assessment & Plan Note (Signed)
Home blood pressure readings in the 130s/70s with occasional higher readings. Cardiologist recently adjusted medications. Current regimen: metoprolol XL 75 mg daily, lisinopril 40 mg daily, hydrochlorothiazide 25 mg daily, and amlodipine 2.5 mg daily. Discussed increasing amlodipine to 5 mg daily to improve control. Explained potential side effects and importance of monitoring for dizziness or hypotension. - Increase amlodipine to 5 mg daily - Monitor blood pressure at home and report if consistently low or if experiencing dizziness

## 2023-08-30 NOTE — Assessment & Plan Note (Signed)
Intermittent pain due to arthritis, exacerbated by physical activity such as golf. Discussed benefits of stretching and back strengthening exercises. - Recommend stretching and back strengthening exercises

## 2023-08-30 NOTE — Telephone Encounter (Signed)
Rx sent #[id number]

## 2023-08-30 NOTE — Assessment & Plan Note (Signed)
Blood sugars averaging in the 160s. Last A1c was 6.9%. Challenges with carbohydrate avoidance discussed in the context of quality of life. - Order A1c test - Check kidney function, liver function, electrolytes, and cholesterol panel - Check vitamin B12 level due to long-term metformin use

## 2023-08-30 NOTE — Telephone Encounter (Signed)
Pt given lab results per notes of Dr. Payton Mccallum on 08/30/23. Pt verbalized understanding. Pt prefers to take metformin 500 MG BID. Pt would like new rx sent to  [id number] [place], [place] - [address]   Hello Joseph Cook,   Your labs results are back and are normal/stable, though your hemoglobin A1c (average blood sugar) is a bit elevated and your vitamin B12 is on the lower end of normal.     I would recommend going ahead and starting a vitamin B12 supplement of 1000 mcg daily.  We can also consider increasing your metformin XR to 1000 mg daily to prevent the need for you to impose further restrictions on your diet.  We can do this either as a 1000 mg tablet once daily or a 500 mg tablet twice daily.   Please let me know which of these options you would prefer for the metformin and let us know if you have any questions or concerns.   Take care, Dr. Payton Mccallum  Written by Sherlyn Hay, DO on 08/30/2023 10:28 AM EST

## 2023-09-13 ENCOUNTER — Ambulatory Visit: Payer: Medicare Other | Admitting: Family Medicine

## 2023-09-13 ENCOUNTER — Other Ambulatory Visit: Payer: Self-pay | Admitting: Family Medicine

## 2023-09-13 ENCOUNTER — Ambulatory Visit: Payer: Medicare Other | Admitting: Physician Assistant

## 2023-09-13 ENCOUNTER — Encounter: Payer: Self-pay | Admitting: Family Medicine

## 2023-09-13 VITALS — BP 148/71 | HR 60 | Resp 16 | Ht 70.0 in | Wt 199.3 lb

## 2023-09-13 DIAGNOSIS — G8929 Other chronic pain: Secondary | ICD-10-CM | POA: Diagnosis not present

## 2023-09-13 DIAGNOSIS — M545 Low back pain, unspecified: Secondary | ICD-10-CM

## 2023-09-13 DIAGNOSIS — M546 Pain in thoracic spine: Secondary | ICD-10-CM | POA: Diagnosis not present

## 2023-09-13 DIAGNOSIS — K429 Umbilical hernia without obstruction or gangrene: Secondary | ICD-10-CM

## 2023-09-13 MED ORDER — MELOXICAM 7.5 MG PO TABS: 7.5000 mg | ORAL_TABLET | Freq: Every day | ORAL | 0 refills | Status: DC

## 2023-09-13 NOTE — Assessment & Plan Note (Addendum)
Continues to have chronic pain due to arthritis Discussed benefits of stretching and back strengthening exercises. - Recommend stretching and back strengthening exercises - Recommend Meloxicam for two weeks to decrease inflammation - risks discussed, no hx of GI bleed or ulcer. Pt on PPI.  - Current Tylenol as needed - Heat, Ice, and rest as need.   08/15/23 CMP: GFR 88, Creatinine 0.81, and BUN 22.

## 2023-09-13 NOTE — Assessment & Plan Note (Signed)
Advised rest, stretches, topical heat.  - be aware of movement and twisting. - Urine w/ reflex for culture sent to r/o kidney involvement - Abdominal US - negative for and findings on 02/16/23 for same concern

## 2023-09-13 NOTE — Assessment & Plan Note (Signed)
Worsening  size of umbilical hernia per pt, starting to bother pt. Bowel movements continue to be normal, soft abdomen, tenderness on hernia, vitals stable.  - refer to GI - non emergent

## 2023-09-13 NOTE — Progress Notes (Signed)
Acute Office Visit  Introduced to nurse practitioner role and practice setting.  All questions answered.  Discussed provider/patient relationship and expectations.   Subjective:     Patient ID: Joseph Cook, male    DOB: [birth year], 82 y.o.   MRN: [id number]  Chief Complaint  Patient presents with   Back Pain   Abdominal Pain    Patient reports is more discomfort rather than pain.Reports that yesterday it was a tight feeling all the way around his waist.    Pt has concerns today for lower back pain due to arthritis for years. Describes as a constant ache worse in mornings, at worst it is a 8/10 and today it is a 5-6/10 in pain scale. Historically has had cortisone injections in SI joint at [facility]. States will intermittently take tylenol, but not daily. He does not use any heat or ice, or topical ointment for pain mgmt.   Also states had intermittent sharp pain when he turns towards in R mid thoracic back, happens a lot while laying in bed.  States was worked up - have xrays and kidney ultrasounds about one year ago, but every normal. He still randomly gets pain - not bothering him today. Inetta Fermo - had lower rib change pain and today no issues.  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   Feels he has Umbilical hernia, getting more  uncomfortable about 4/10 in pain, has worsen over past year "I used to have an innie belly button". States had normal bowel movements, soft, once per day. No nausea, vomiting, or heartburn. Denies urinary issues, recent infections, or hx of GI issues.   Back Pain This is a chronic problem. The current episode started more than 1 year ago. The problem occurs daily. The pain is present in the lumbar spine, sacro-iliac and thoracic spine. The quality of the pain is described as aching and stabbing. The pain is moderate. The pain is Worse during the day. The symptoms are aggravated by position and  twisting. Stiffness is present In the morning. Pertinent negatives include no dysuria, fever, headaches or weight loss. He has tried analgesics for the symptoms. The treatment provided mild relief.  Abdominal Pain This is a chronic problem. The current episode started more than 1 year ago. The onset quality is gradual. The problem occurs intermittently. The problem has been gradually worsening. The pain is located in the periumbilical region. The pain is at a severity of 4/10. The pain is mild. The quality of the pain is dull. The abdominal pain does not radiate. Pertinent negatives include no anorexia, arthralgias, belching, constipation, diarrhea, dysuria, fever, flatus, frequency, headaches, hematochezia, hematuria, melena, myalgias, nausea, vomiting or weight loss. Nothing (hernia enlaring - bothersome) aggravates the pain. He has tried nothing for the symptoms.    Review of Systems  Constitutional:  Negative for fever and weight loss.  Gastrointestinal:  Negative for anorexia, constipation, diarrhea, flatus, hematochezia, melena, nausea and vomiting.  Genitourinary:  Negative for dysuria, frequency and hematuria.  Musculoskeletal:  Positive for back pain. Negative for arthralgias and myalgias.  Neurological:  Negative for headaches.  All other systems reviewed and are negative.     Objective:    BP (!) 148/71 (BP Location: Left Arm, Patient Position: Sitting, Cuff Size: Large)   Pulse 60   Resp 16   Ht 5\' 10"  (1.778 m)   Wt 199 lb 4.8 oz (90.4 kg)   BMI  28.60 kg/m    Physical Exam Constitutional:      Appearance: He is well-developed and normal weight.  HENT:     Head: Normocephalic.  Eyes:     Extraocular Movements: Extraocular movements intact.     Pupils: Pupils are equal, round, and reactive to light.  Cardiovascular:     Rate and Rhythm: Normal rate.     Heart sounds: Murmur heard.  Pulmonary:     Effort: No respiratory distress.     Breath sounds: No stridor. No  wheezing, rhonchi or rales.  Chest:     Chest wall: No tenderness.  Abdominal:     General: Abdomen is protuberant. Bowel sounds are normal. There is no distension or abdominal bruit. There are no signs of injury.     Palpations: Abdomen is soft. There is no shifting dullness, fluid wave, hepatomegaly, splenomegaly, mass or pulsatile mass.     Tenderness: There is no abdominal tenderness. There is no right CVA tenderness or left CVA tenderness.     Hernia: A hernia is present. Hernia is present in the umbilical area.  Musculoskeletal:     Cervical back: Normal.     Thoracic back: No swelling, edema, deformity, signs of trauma, lacerations, spasms, tenderness or bony tenderness. Normal range of motion. No scoliosis.     Lumbar back: No swelling, edema, deformity, signs of trauma, lacerations, spasms, tenderness or bony tenderness. Normal range of motion. Negative right straight leg raise test and negative left straight leg raise test. No scoliosis.  Skin:    General: Skin is warm and dry.     Capillary Refill: Capillary refill takes less than 2 seconds.  Neurological:     Mental Status: He is alert.  Psychiatric:        Mood and Affect: Mood normal.        Behavior: Behavior normal.   Pt states heart murmur known for years.   No results found for any visits on 09/13/23.      Assessment & Plan:   Problem List Items Addressed This Visit       Other   Low back pain - Primary   Continues to have chronic pain due to arthritis Discussed benefits of stretching and back strengthening exercises. - Recommend stretching and back strengthening exercises - Recommend Meloxicam for two weeks to decrease inflammation - risks discussed, no hx of GI bleed or ulcer. Pt on PPI.  - Current Tylenol as needed - Heat, Ice, and rest as need.   08/15/23 CMP: GFR 88, Creatinine 0.81, and BUN 22.          Relevant Medications   meloxicam (MOBIC) 7.5 MG tablet   Umbilical hernia without obstruction  and without gangrene   Worsening  size of umbilical hernia per pt, starting to bother pt. Bowel movements continue to be normal, soft abdomen, tenderness on hernia, vitals stable.  - refer to GI - non emergent      Relevant Orders   Ambulatory referral to Gastroenterology   Right-sided thoracic back pain   Advised rest, stretches, topical heat.  - be aware of movement and twisting. - Urine w/ reflex for culture sent to r/o kidney involvement - Abdominal US - negative for and findings on 02/16/23 for same concern      Relevant Medications   meloxicam (MOBIC) 7.5 MG tablet   Other Relevant Orders   Urinalysis w microscopic + reflex cultur      Meds ordered this encounter  Medications  meloxicam (MOBIC) 7.5 MG tablet    Sig: Take 1 tablet (7.5 mg total) by mouth daily.    Dispense:  30 tablet    Refill:  0   Will communicate lab results.   Return if symptoms worsen or fail to improve.  I, Sallee Provencal, FNP, have reviewed all documentation for this visit. The documentation on 09/13/23 for the exam, diagnosis, procedures, and orders are all accurate and complete.   Sallee Provencal, FNP

## 2023-09-14 LAB — SPECIMEN STATUS REPORT

## 2023-09-17 LAB — SPECIMEN STATUS REPORT

## 2023-09-27 LAB — HM DIABETES EYE EXAM

## 2023-10-10 ENCOUNTER — Other Ambulatory Visit: Payer: Self-pay | Admitting: Family Medicine

## 2023-10-10 DIAGNOSIS — M546 Pain in thoracic spine: Secondary | ICD-10-CM

## 2023-10-10 NOTE — Telephone Encounter (Signed)
**Note De-identified  [place] Obfuscation** Please advise 

## 2023-10-18 ENCOUNTER — Other Ambulatory Visit: Payer: Self-pay | Admitting: Family Medicine

## 2023-10-18 DIAGNOSIS — E119 Type 2 diabetes mellitus without complications: Secondary | ICD-10-CM

## 2023-10-18 NOTE — Telephone Encounter (Signed)
Discontinued Dose change Joseph Cook, [facility] 08/30/23 1304         Requested Prescriptions  Refused Prescriptions Disp Refills   metFORMIN (GLUCOPHAGE-XR) 750 MG 24 hr tablet [Pharmacy Med Name: METFORMIN HCL ER 750 MG TABLET] 90 tablet 0    Sig: TAKE 1 TABLET BY MOUTH EVERY DAY WITH BREAKFAST     Endocrinology:  Diabetes - Biguanides Failed - 10/18/2023  9:43 AM      Failed - HBA1C is between 0 and 7.9 and within 180 days    Hgb A1c MFr Bld  Date Value Ref Range Status  08/15/2023 8.2 (H) 4.8 - 5.6 % Final    Comment:             Prediabetes: 5.7 - 6.4          Diabetes: >6.4          Glycemic control for adults with diabetes: <7.0          Failed - CBC within normal limits and completed in the last 12 months    WBC  Date Value Ref Range Status  11/15/2021 7.0 4.0 - 10.5 K/uL Final   RBC  Date Value Ref Range Status  11/15/2021 4.87 4.22 - 5.81 MIL/uL Final   Hemoglobin  Date Value Ref Range Status  11/15/2021 14.6 13.0 - 17.0 g/dL Final  16/06/9603 54.0 13.0 - 17.7 g/dL Final   HCT  Date Value Ref Range Status  11/15/2021 41.6 39.0 - 52.0 % Final   Hematocrit  Date Value Ref Range Status  02/18/2021 43.8 37.5 - 51.0 % Final   MCHC  Date Value Ref Range Status  11/15/2021 35.1 30.0 - 36.0 g/dL Final   [facility]  Date Value Ref Range Status  11/15/2021 30.0 26.0 - 34.0 pg Final   MCV  Date Value Ref Range Status  11/15/2021 85.4 80.0 - 100.0 fL Final  02/18/2021 89 79 - 97 fL Final   No results found for: "PLTCOUNTKUC", "LABPLAT", "POCPLA" RDW  Date Value Ref Range Status  11/15/2021 13.1 11.5 - 15.5 % Final  02/18/2021 13.4 11.6 - 15.4 % Final         Passed - Cr in normal range and within 360 days    Creatinine, Ser  Date Value Ref Range Status  08/15/2023 0.81 0.76 - 1.27 mg/dL Final         Passed - eGFR in normal range and within 360 days    GFR calc Af Amer  Date Value Ref Range Status  02/06/2020 96 >59 mL/min/1.73 Final    Comment:     **[facility] currently reports eGFR in compliance with the current**   recommendations of the [employer]. [facility] will   update reporting as new guidelines are published from the NKF-ASN   Task force.    GFR, Estimated  Date Value Ref Range Status  10/10/2022 >60 >60 mL/min Final    Comment:    (NOTE) Calculated using the CKD-EPI Creatinine Equation (2021)    eGFR  Date Value Ref Range Status  08/15/2023 88 >59 mL/min/1.73 Final         Passed - B12 Level in normal range and within 720 days    Vitamin B-12  Date Value Ref Range Status  08/15/2023 243 232 - 1,245 pg/mL Final         Passed - Valid encounter within last 6 months    Recent Outpatient Visits  1 month ago Chronic bilateral low back pain without sciatica   [place] [facility] [place], Jennette Kettle, FNP   2 months ago Essential hypertension   [facility] [place] [facility] Sherlyn Hay, DO   8 months ago Right flank pain   [place] [facility] Alfredia Ferguson, PA-C   1 year ago Controlled type 2 diabetes mellitus without complication, without long-term current use of insulin [facility])   [place] [facility] Alfredia Ferguson, PA-C   1 year ago Acute right-sided thoracic back pain   [facility] Alfredia Ferguson, PA-C       Future Appointments             In 1 week Elie Goody, MD [facility]

## 2023-10-23 ENCOUNTER — Ambulatory Visit: Payer: 59 | Admitting: Dermatology

## 2023-10-29 ENCOUNTER — Encounter: Payer: Self-pay | Admitting: Dermatology

## 2023-10-29 ENCOUNTER — Ambulatory Visit (INDEPENDENT_AMBULATORY_CARE_PROVIDER_SITE_OTHER): Payer: Medicare Other | Admitting: Dermatology

## 2023-10-29 DIAGNOSIS — D492 Neoplasm of unspecified behavior of bone, soft tissue, and skin: Secondary | ICD-10-CM

## 2023-10-29 DIAGNOSIS — L578 Other skin changes due to chronic exposure to nonionizing radiation: Secondary | ICD-10-CM

## 2023-10-29 DIAGNOSIS — D485 Neoplasm of uncertain behavior of skin: Secondary | ICD-10-CM

## 2023-10-29 DIAGNOSIS — W908XXA Exposure to other nonionizing radiation, initial encounter: Secondary | ICD-10-CM

## 2023-10-29 DIAGNOSIS — Z1283 Encounter for screening for malignant neoplasm of skin: Secondary | ICD-10-CM | POA: Diagnosis not present

## 2023-10-29 DIAGNOSIS — Z86018 Personal history of other benign neoplasm: Secondary | ICD-10-CM

## 2023-10-29 DIAGNOSIS — Z85828 Personal history of other malignant neoplasm of skin: Secondary | ICD-10-CM

## 2023-10-29 DIAGNOSIS — L814 Other melanin hyperpigmentation: Secondary | ICD-10-CM | POA: Diagnosis not present

## 2023-10-29 DIAGNOSIS — L570 Actinic keratosis: Secondary | ICD-10-CM | POA: Diagnosis not present

## 2023-10-29 DIAGNOSIS — L821 Other seborrheic keratosis: Secondary | ICD-10-CM

## 2023-10-29 DIAGNOSIS — D229 Melanocytic nevi, unspecified: Secondary | ICD-10-CM

## 2023-10-29 DIAGNOSIS — D1801 Hemangioma of skin and subcutaneous tissue: Secondary | ICD-10-CM

## 2023-10-29 NOTE — Progress Notes (Signed)
Follow-Up Visit   Subjective  Joseph Cook is a 83 y.o. male who presents for the following: Skin Cancer Screening and Full Body Skin Exam  The patient presents for Total-Body Skin Exam (TBSE) for skin cancer screening and mole check. The patient has spots, moles and lesions to be evaluated, some may be new or changing and the patient may have concern these could be cancer.  Hx of BCC, dysplastic nevus. Patient does have a spot at left temple, he would like to know if it could be treated.  The following portions of the chart were reviewed this encounter and updated as appropriate: medications, allergies, medical history  Review of Systems:  No other skin or systemic complaints except as noted in HPI or Assessment and Plan.  Objective  Well appearing patient in no apparent distress; mood and affect are within normal limits.  A full examination was performed including scalp, head, eyes, ears, nose, lips, neck, chest, axillae, abdomen, back, buttocks, bilateral upper extremities, bilateral lower extremities, hands, feet, fingers, toes, fingernails, and toenails. All findings within normal limits unless otherwise noted below.   Relevant physical exam findings are noted in the Assessment and Plan.  Left Temple Stuck-on, waxy, tan-brown papule or plaque --Discussed benign etiology and prognosis.  R pretibial x 1, L helix x 4, frontal scalp x 6, vertex scalp x 3, R parietal scalp x 3, R helix x 2, R antihelix x 1, R preauricular x 2, R zygoma x 1, R forehead x 1, L forehead x 1, nasal tip x 1, R infraorbital x 1 (27) Erythematous thin papules/macules with gritty scale.  left pretibial 0.9 cm pink scar like macule   Assessment & Plan   SKIN CANCER SCREENING PERFORMED TODAY.  ACTINIC DAMAGE - Chronic condition, secondary to cumulative UV/sun exposure - diffuse scaly erythematous macules with underlying dyspigmentation - Recommend daily broad spectrum sunscreen SPF 30+ to sun-exposed  areas, reapply every 2 hours as needed.  - Staying in the shade or wearing long sleeves, sun glasses (UVA+UVB protection) and wide brim hats (4-inch brim around the entire circumference of the hat) are also recommended for sun protection.  - Call for new or changing lesions.  LENTIGINES, SEBORRHEIC KERATOSES, HEMANGIOMAS - Benign normal skin lesions - Benign-appearing - Call for any changes  MELANOCYTIC NEVI - Tan-brown and/or pink-flesh-colored symmetric macules and papules - Benign appearing on exam today - Observation - Call clinic for new or changing moles - Recommend daily use of broad spectrum spf 30+ sunscreen to sun-exposed areas.   History of Dysplastic Nevi Right medial lower leg above medial ankle 2010 - No evidence of recurrence today - Recommend regular full body skin exams - Recommend daily broad spectrum sunscreen SPF 30+ to sun-exposed areas, reapply every 2 hours as needed.  - Call if any new or changing lesions are noted between office visits    History of Basal Cell Carcinoma of the Skin - No evidence of recurrence today - Recommend regular full body skin exams - Recommend daily broad spectrum sunscreen SPF 30+ to sun-exposed areas, reapply every 2 hours as needed.  - Call if any new or changing lesions are noted between office visits     SEBORRHEIC KERATOSIS Left Temple Patient would like to treat, advised cosmetic.  Patient paid $60 cosmetic charge. Do not bill insurance Destruction of lesion - Left Temple Complexity: simple   Destruction method: cryotherapy   Informed consent: discussed and consent obtained   Timeout:  patient name,  date of birth, surgical site, and procedure verified Lesion destroyed using liquid nitrogen: Yes   Region frozen until ice ball extended beyond lesion: Yes   Cryo cycles: 1 or 2. Outcome: patient tolerated procedure well with no complications   Post-procedure details: wound care instructions given   AK (ACTINIC KERATOSIS)  (27) R pretibial x 1, L helix x 4, frontal scalp x 6, vertex scalp x 3, R parietal scalp x 3, R helix x 2, R antihelix x 1, R preauricular x 2, R zygoma x 1, R forehead x 1, L forehead x 1, nasal tip x 1, R infraorbital x 1 (27) Actinic keratoses are precancerous spots that appear secondary to cumulative UV radiation exposure/sun exposure over time. They are chronic with expected duration over 1 year. A portion of actinic keratoses will progress to squamous cell carcinoma of the skin. It is not possible to reliably predict which spots will progress to skin cancer and so treatment is recommended to prevent development of skin cancer.  Recommend daily broad spectrum sunscreen SPF 30+ to sun-exposed areas, reapply every 2 hours as needed.  Recommend staying in the shade or wearing long sleeves, sun glasses (UVA+UVB protection) and wide brim hats (4-inch brim around the entire circumference of the hat). Call for new or changing lesions.  Will treat AK's at hands on follow up.  Destruction of lesion - R pretibial x 1, L helix x 4, frontal scalp x 6, vertex scalp x 3, R parietal scalp x 3, R helix x 2, R antihelix x 1, R preauricular x 2, R zygoma x 1, R forehead x 1, L forehead x 1, nasal tip x 1, R infraorbital x 1 (27) Complexity: simple   Destruction method: cryotherapy   Informed consent: discussed and consent obtained   Timeout:  patient name, date of birth, surgical site, and procedure verified Lesion destroyed using liquid nitrogen: Yes   Region frozen until ice ball extended beyond lesion: Yes   Cryo cycles: 1 or 2. Outcome: patient tolerated procedure well with no complications   Post-procedure details: wound care instructions given   NEOPLASM OF UNCERTAIN BEHAVIOR OF SKIN left pretibial DDX BCC  Recheck on follow up and plan biopsy if indicated.  MULTIPLE BENIGN NEVI   LENTIGINES   ACTINIC ELASTOSIS   CHERRY ANGIOMA   Return in about 6 months (around 04/27/2024) for AK follow  up, recheck left pretibial, with Dr. Katrinka Blazing.  [name], RMA, am acting as scribe for Elie Goody, MD .   Documentation: I have reviewed the above documentation for accuracy and completeness, and I agree with the above.  Elie Goody, MD

## 2023-10-29 NOTE — Patient Instructions (Addendum)

## 2023-12-04 ENCOUNTER — Other Ambulatory Visit: Payer: Self-pay | Admitting: Family Medicine

## 2023-12-04 DIAGNOSIS — E119 Type 2 diabetes mellitus without complications: Secondary | ICD-10-CM

## 2023-12-05 NOTE — Telephone Encounter (Signed)
 Refusing metformin because it's being requested too soon.

## 2023-12-26 ENCOUNTER — Other Ambulatory Visit: Payer: Self-pay | Admitting: Family Medicine

## 2023-12-26 DIAGNOSIS — E7800 Pure hypercholesterolemia, unspecified: Secondary | ICD-10-CM

## 2023-12-27 NOTE — Telephone Encounter (Signed)
 OV 09/13/23 Requested Prescriptions  Pending Prescriptions Disp Refills   atorvastatin (LIPITOR) 10 MG tablet [Pharmacy Med Name: ATORVASTATIN 10 MG TABLET] 90 tablet 1    Sig: TAKE 1 TABLET BY MOUTH EVERYDAY AT BEDTIME     Cardiovascular:  Antilipid - Statins Failed - 12/27/2023 12:58 PM      Failed - Valid encounter within last 12 months    Recent Outpatient Visits   None     Future Appointments             In 4 months Elie Goody, MD [place] [facility]            Failed - Lipid Panel in normal range within the last 12 months    Cholesterol, Total  Date Value Ref Range Status  08/15/2023 115 100 - 199 mg/dL Final   LDL Chol Calc (NIH)  Date Value Ref Range Status  08/15/2023 56 0 - 99 mg/dL Final   HDL  Date Value Ref Range Status  08/15/2023 35 (L) >39 mg/dL Final   Triglycerides  Date Value Ref Range Status  08/15/2023 133 0 - 149 mg/dL Final         Passed - Patient is not pregnant       tamsulosin (FLOMAX) 0.4 MG CAPS capsule [Pharmacy Med Name: TAMSULOSIN HCL 0.4 MG CAPSULE] 90 capsule 1    Sig: TAKE 1 CAPSULE BY MOUTH EVERY DAY     Urology: Alpha-Adrenergic Blocker Failed - 12/27/2023 12:58 PM      Failed - PSA in normal range and within 360 days    Prostate Specific Ag, Serum  Date Value Ref Range Status  02/18/2021 1.0 0.0 - 4.0 ng/mL Final    Comment:    Roche ECLIA methodology. According to the American Urological Association, Serum PSA should decrease and remain at undetectable levels after radical prostatectomy. The AUA defines biochemical recurrence as an initial PSA value 0.2 ng/mL or greater followed by a subsequent confirmatory PSA value 0.2 ng/mL or greater. Values obtained with different assay methods or kits cannot be used interchangeably. Results cannot be interpreted as absolute evidence of the presence or absence of malignant disease.          Failed - Last BP in normal range    BP Readings from Last 1  Encounters:  09/13/23 (!) 148/71         Failed - Valid encounter within last 12 months    Recent Outpatient Visits   None     Future Appointments             In 4 months Elie Goody, MD [facility] [place] [facility]

## 2023-12-27 NOTE — Telephone Encounter (Signed)
 Requested medication (s) are due for refill today - yes  Requested medication (s) are on the active medication list -yes  Future visit scheduled -yes  Last refill: 07/13/23 #90 1Rf  Notes to clinic: fails lab protocol- over 1 year-02/18/21  Requested Prescriptions  Pending Prescriptions Disp Refills   tamsulosin (FLOMAX) 0.4 MG CAPS capsule [Pharmacy Med Name: TAMSULOSIN HCL 0.4 MG CAPSULE] 90 capsule 1    Sig: TAKE 1 CAPSULE BY MOUTH EVERY DAY     Urology: Alpha-Adrenergic Blocker Failed - 12/27/2023  1:00 PM      Failed - PSA in normal range and within 360 days    Prostate Specific Ag, Serum  Date Value Ref Range Status  02/18/2021 1.0 0.0 - 4.0 ng/mL Final    Comment:    Roche ECLIA methodology. According to the American Urological Association, Serum PSA should decrease and remain at undetectable levels after radical prostatectomy. The AUA defines biochemical recurrence as an initial PSA value 0.2 ng/mL or greater followed by a subsequent confirmatory PSA value 0.2 ng/mL or greater. Values obtained with different assay methods or kits cannot be used interchangeably. Results cannot be interpreted as absolute evidence of the presence or absence of malignant disease.          Failed - Last BP in normal range    BP Readings from Last 1 Encounters:  09/13/23 (!) 148/71         Failed - Valid encounter within last 12 months    Recent Outpatient Visits   None     Future Appointments             In 4 months Elie Goody, MD [facility] [place] [facility]            Signed Prescriptions Disp Refills   atorvastatin (LIPITOR) 10 MG tablet 90 tablet 1    Sig: TAKE 1 TABLET BY MOUTH EVERYDAY AT BEDTIME     Cardiovascular:  Antilipid - Statins Failed - 12/27/2023  1:00 PM      Failed - Valid encounter within last 12 months    Recent Outpatient Visits   None     Future Appointments             In 4 months Elie Goody, MD [facility]            Failed - Lipid Panel in normal range within the last 12 months    Cholesterol, Total  Date Value Ref Range Status  08/15/2023 115 100 - 199 mg/dL Final   LDL Chol Calc (NIH)  Date Value Ref Range Status  08/15/2023 56 0 - 99 mg/dL Final   HDL  Date Value Ref Range Status  08/15/2023 35 (L) >39 mg/dL Final   Triglycerides  Date Value Ref Range Status  08/15/2023 133 0 - 149 mg/dL Final         Passed - Patient is not pregnant         Requested Prescriptions  Pending Prescriptions Disp Refills   tamsulosin (FLOMAX) 0.4 MG CAPS capsule [Pharmacy Med Name: TAMSULOSIN HCL 0.4 MG CAPSULE] 90 capsule 1    Sig: TAKE 1 CAPSULE BY MOUTH EVERY DAY     Urology: Alpha-Adrenergic Blocker Failed - 12/27/2023  1:00 PM      Failed - PSA in normal range and within 360 days    Prostate Specific Ag, Serum  Date Value Ref Range Status  02/18/2021 1.0 0.0 - 4.0 ng/mL Final  Comment:    Roche ECLIA methodology. According to the American Urological Association, Serum PSA should decrease and remain at undetectable levels after radical prostatectomy. The AUA defines biochemical recurrence as an initial PSA value 0.2 ng/mL or greater followed by a subsequent confirmatory PSA value 0.2 ng/mL or greater. Values obtained with different assay methods or kits cannot be used interchangeably. Results cannot be interpreted as absolute evidence of the presence or absence of malignant disease.          Failed - Last BP in normal range    BP Readings from Last 1 Encounters:  09/13/23 (!) 148/71         Failed - Valid encounter within last 12 months    Recent Outpatient Visits   None     Future Appointments             In 4 months Elie Goody, MD [facility]            Signed Prescriptions Disp Refills   atorvastatin (LIPITOR) 10 MG tablet 90 tablet 1    Sig: TAKE 1 TABLET BY MOUTH EVERYDAY AT BEDTIME     Cardiovascular:   Antilipid - Statins Failed - 12/27/2023  1:00 PM      Failed - Valid encounter within last 12 months    Recent Outpatient Visits   None     Future Appointments             In 4 months Elie Goody, MD [place] [facility]            Failed - Lipid Panel in normal range within the last 12 months    Cholesterol, Total  Date Value Ref Range Status  08/15/2023 115 100 - 199 mg/dL Final   LDL Chol Calc (NIH)  Date Value Ref Range Status  08/15/2023 56 0 - 99 mg/dL Final   HDL  Date Value Ref Range Status  08/15/2023 35 (L) >39 mg/dL Final   Triglycerides  Date Value Ref Range Status  08/15/2023 133 0 - 149 mg/dL Final         Passed - Patient is not pregnant

## 2024-01-13 ENCOUNTER — Other Ambulatory Visit: Payer: Self-pay | Admitting: Internal Medicine

## 2024-02-27 ENCOUNTER — Ambulatory Visit: Payer: Self-pay

## 2024-02-27 DIAGNOSIS — Z0000 Encounter for general adult medical examination without abnormal findings: Secondary | ICD-10-CM

## 2024-02-27 NOTE — Patient Instructions (Addendum)
 [name] , Thank you for taking time out of your busy schedule to complete your Annual Wellness Visit with me. I enjoyed our conversation and look forward to speaking with you again next year. I, as well as your care team,  appreciate your ongoing commitment to your health goals. Please review the following plan we discussed and let me know if I can assist you in the future.   Follow up Visits: Next Medicare AWV with our clinical staff:   03/10/25 @ 1:10 PM BY PHONE Have you seen your provider in the last 6 months (3 months if uncontrolled diabetes)? Yes  Clinician Recommendations:  Aim for 30 minutes of exercise or brisk walking, 6-8 glasses of water, and 5 servings of fruits and vegetables each day. TAKE CARE!      This is a list of the screening recommended for you and due dates:  Health Maintenance  Topic Date Due   Hemoglobin A1C  02/12/2024   COVID-19 Vaccine (3 - Pfizer risk series) 05/26/2024*   Flu Shot  04/25/2024   Yearly kidney function blood test for diabetes  08/14/2024   Yearly kidney health urinalysis for diabetes  08/14/2024   Complete foot exam   08/14/2024   Eye exam for diabetics  09/26/2024   Medicare Annual Wellness Visit  02/26/2025   DTaP/Tdap/Td vaccine (3 - Td or Tdap) 08/16/2033   Pneumonia Vaccine  Completed   Zoster (Shingles) Vaccine  Completed   HPV Vaccine  Aged Out   Meningitis B Vaccine  Aged Out  *Topic was postponed. The date shown is not the original due date.    Advanced directives: (ACP Link)Information on Advanced Care Planning can be found at [place]  [employer] [facility] Advance Health Care Directives Advance Health Care Directives. [email or web address]  Advance Care Planning is important because it:  [x]  Makes sure you receive the medical care that is consistent with your values, goals, and preferences  [x]  It provides guidance to your family and loved ones and reduces their decisional burden about whether or not they are making the right  decisions based on your wishes.  Follow the link provided in your after visit summary or read over the paperwork we have mailed to you to help you started getting your Advance Directives in place. If you need assistance in completing these, please reach out to us  so that we can help you!

## 2024-02-27 NOTE — Progress Notes (Signed)
 Subjective:   Joseph Cook is a 83 y.o. who presents for a Medicare Wellness preventive visit.  As a reminder, Annual Wellness Visits don't include a physical exam, and some assessments may be limited, especially if this visit is performed virtually. We may recommend an in-person follow-up visit with your provider if needed.  Visit Complete: Virtual I connected with  Joseph Cook on 02/27/24 by a audio enabled telemedicine application and verified that I am speaking with the correct person using two identifiers.  Patient Location: Home  Provider Location: Home Office  I discussed the limitations of evaluation and management by telemedicine. The patient expressed understanding and agreed to proceed.  Vital Signs: Because this visit was a virtual/telehealth visit, some criteria may be missing or patient reported. Any vitals not documented were not able to be obtained and vitals that have been documented are patient reported.  VideoDeclined- This patient declined Librarian, academic. Therefore the visit was completed with audio only.  Persons Participating in Visit: Patient.  AWV Questionnaire: No: Patient Medicare AWV questionnaire was not completed prior to this visit.  Cardiac Risk Factors include: advanced age (>17men, >81 women);diabetes mellitus;hypertension;dyslipidemia;male gender     Objective:     There were no vitals filed for this visit. There is no height or weight on file to calculate BMI.     02/27/2024    2:00 PM 02/26/2023    2:30 PM 02/23/2022    2:35 PM 01/06/2022    7:51 AM 11/14/2021    3:42 PM 02/05/2020   10:17 AM  Advanced Directives  Does Patient Have a Medical Advance Directive? No Yes Yes Yes No Yes  Type of Special educational needs teacher of [place];Living will Healthcare Power of [place];Living will   Healthcare Power of [place];Living will  Does patient want to make changes to medical advance directive?   Yes  (Inpatient - patient defers changing a medical advance directive and declines information at this time)     Copy of Healthcare Power of Attorney in Chart?   No - copy requested   No - copy requested  Would patient like information on creating a medical advance directive? No - Patient declined    No - Patient declined     Current Medications (verified) Outpatient Encounter Medications as of 02/27/2024  Medication Sig   amLODipine  (NORVASC ) 5 MG tablet Take 1 tablet (5 mg total) by mouth daily.   Aspirin 81 MG CAPS Take 81 mg by mouth daily.   atorvastatin  (LIPITOR) 10 MG tablet TAKE 1 TABLET BY MOUTH EVERYDAY AT BEDTIME   Blood Glucose Monitoring Suppl (ONE TOUCH ULTRA 2) w/Device KIT USE TO CHECK BLOOD SUGAR UP TO 4 TIMES DAILY   Cholecalciferol (VITAMIN D3) 50 MCG (2000 UT) TABS Take 2,000 Units by mouth daily.    cyanocobalamin (VITAMIN B12) 1000 MCG tablet Take 1,000 mcg by mouth daily.   glucose blood (ONETOUCH ULTRA) test strip Use to test blood sugar once a day.   Lancets (ONETOUCH DELICA PLUS LANCET33G) MISC USE TO TEST BLOOD SUGAR AT LEAST ONCE DAILY   lisinopril -hydrochlorothiazide  (ZESTORETIC ) 20-12.5 MG tablet TAKE 2 TABLETS BY MOUTH EVERY DAY   metFORMIN  (GLUCOPHAGE ) 500 MG tablet Take 1 tablet (500 mg total) by mouth 2 (two) times daily with a meal.   Multiple Vitamins-Minerals (PRESERVISION AREDS 2 PO) Take 1 capsule by mouth 2 (two) times daily.    omeprazole  (PRILOSEC) 40 MG capsule TAKE 1 CAPSULE BY MOUTH EVERY DAY  tamsulosin  (FLOMAX ) 0.4 MG CAPS capsule TAKE 1 CAPSULE BY MOUTH EVERY DAY   metoprolol  succinate (TOPROL -XL) 50 MG 24 hr tablet Take 1.5 tablets (75 mg total) by mouth daily. Take with or immediately following a meal.   [DISCONTINUED] meloxicam  (MOBIC ) 7.5 MG tablet TAKE 1 TABLET BY MOUTH EVERY DAY   [DISCONTINUED] mupirocin  ointment (BACTROBAN ) 2 % Apply to skin qd-bid   No facility-administered encounter medications on file as of 02/27/2024.    Allergies  (verified) Patient has no known allergies.   History: Past Medical History:  Diagnosis Date   Actinic keratosis    Basal cell carcinoma    Cataract    Diabetes mellitus    Dysplastic nevus 08/13/2009   right medial lower leg above medial ankle, malleolus -severe   Glaucoma    Hyperlipidemia    Hypertension    PVC (premature ventricular contraction)    Past Surgical History:  Procedure Laterality Date   HERNIA REPAIR     ROTATOR CUFF REPAIR     TEE WITHOUT CARDIOVERSION N/A 01/06/2022   Procedure: TRANSESOPHAGEAL ECHOCARDIOGRAM (TEE);  Surgeon: Devorah Fonder, MD;  Location: [facility] ORS;  Service: Cardiovascular;  Laterality: N/A;   Family History  Problem Relation Age of Onset   Stroke Mother    Heart attack Father    Alzheimer's disease Maternal Aunt    Heart attack Maternal Uncle    Cancer Maternal Grandmother    Cancer Paternal Grandmother    Social History   Socioeconomic History   Marital status: Married    Spouse name: Not on file   Number of children: 5   Years of education: Not on file   Highest education level: Associate degree: occupational, Scientist, product/process development, or vocational program  Occupational History   Occupation: retired  Tobacco Use   Smoking status: Never   Smokeless tobacco: Former  Building services engineer status: Never Used  Substance and Sexual Activity   Alcohol use: Not Currently   Drug use: No   Sexual activity: Not on file  Other Topics Concern   Not on file  Social History Narrative   Not on file   Social Drivers of Health   Financial Resource Strain: Low Risk  (02/27/2024)   Overall Financial Resource Strain (CARDIA)    Difficulty of Paying Living Expenses: Not hard at all  Food Insecurity: No Food Insecurity (02/27/2024)   Hunger Vital Sign    Worried About Running Out of Food in the Last Year: Never true    Ran Out of Food in the Last Year: Never true  Transportation Needs: No Transportation Needs (02/27/2024)   PRAPARE - Therapist, art (Medical): No    Lack of Transportation (Non-Medical): No  Physical Activity: Insufficiently Active (02/27/2024)   Exercise Vital Sign    Days of Exercise per Week: 2 days    Minutes of Exercise per Session: 50 min  Stress: No Stress Concern Present (02/27/2024)   [employer] of Occupational Health - Occupational Stress Questionnaire    Feeling of Stress : Not at all  Social Connections: Socially Integrated (02/27/2024)   Social Connection and Isolation Panel [NHANES]    Frequency of Communication with Friends and Family: Three times a week    Frequency of Social Gatherings with Friends and Family: Once a week    Attends Religious Services: More than 4 times per year    Active Member of [employer] or Organizations: Yes    Attends Banker  Meetings: More than 4 times per year    Marital Status: Married    Tobacco Counseling Counseling given: Not Answered    Clinical Intake:  Pre-visit preparation completed: Yes  Pain : No/denies pain     BMI - recorded: 28.6 Nutritional Status: BMI 25 -29 Overweight Nutritional Risks: None Diabetes: Yes CBG done?: No Did pt. bring in CBG monitor from home?: No  Lab Results  Component Value Date   HGBA1C 8.2 (H) 08/15/2023   HGBA1C 6.9 (H) 02/06/2023   HGBA1C 7.1 (H) 08/14/2022     How often do you need to have someone help you when you read instructions, pamphlets, or other written materials from your doctor or pharmacy?: 1 - Never  Interpreter Needed?: No  Information entered by :: Dellie Fergusson, LPN   Activities of Daily Living    02/27/2024    2:01 PM 02/27/2024    9:58 AM  In your present state of health, do you have any difficulty performing the following activities:  Hearing? 0 0  Vision? 0 0  Difficulty concentrating or making decisions? 0 0  Walking or climbing stairs? 0 0  Dressing or bathing? 0 0  Doing errands, shopping? 0 0  Preparing Food and eating ? N N  Using the Toilet? N    In the past six months, have you accidently leaked urine? N N  Do you have problems with loss of bowel control? N N  Managing your Medications? N N  Managing your Finances? N N  Housekeeping or managing your Housekeeping? N N    Patient Care Team: Carlean Charter, DO as PCP - General (Family Medicine) End, Veryl Gottron, MD as PCP - Cardiology (Cardiology) Boyce Byes, MD as PCP - Electrophysiology (Cardiology) Pa, [facility]) Elta Halter, MD (Dermatology)  I have updated your Care Teams any recent Medical Services you may have received from other providers in the past year.     Assessment:    This is a routine wellness examination for Joseph Cook.  Hearing/Vision screen Hearing Screening - Comments:: NO AIDS Vision Screening - Comments:: READERS- DR.HAMPTON   Goals Addressed             This Visit's Progress    DIET - REDUCE SUGAR INTAKE         Depression Screen     02/27/2024    1:59 PM 09/13/2023    1:24 PM 02/26/2023    2:28 PM 02/06/2023   10:02 AM 08/14/2022    9:23 AM 02/23/2022    2:33 PM 10/06/2021    3:43 PM  PHQ 2/9 Scores  PHQ - 2 Score 0 0 0 0 0 0 0  PHQ- 9 Score 0   0 0 0 0    Fall Risk     02/27/2024    2:01 PM 02/27/2024    9:58 AM 09/13/2023    1:24 PM 02/26/2023    2:26 PM 02/06/2023   10:02 AM  Fall Risk   Falls in the past year? 0 0 0 0 0  Number falls in past yr: 0 0 0 0 0  Injury with Fall? 0 0 0 0 0  Risk for fall due to : No Fall Risks  No Fall Risks No Fall Risks No Fall Risks  Follow up Falls evaluation completed   Education provided;Falls prevention discussed Falls evaluation completed    MEDICARE RISK AT HOME:  Medicare Risk at Home Any stairs in or around the home?:  Yes If so, are there any without handrails?: No Home free of loose throw rugs in walkways, pet beds, electrical cords, etc?: Yes Adequate lighting in your home to reduce risk of falls?: Yes Life alert?: No Use of a cane, walker or w/c?:  No Grab bars in the bathroom?: Yes Shower chair or bench in shower?: Yes Elevated toilet seat or a handicapped toilet?: Yes  TIMED UP AND GO:  Was the test performed?  No  Cognitive Function: 6CIT completed        02/27/2024    2:02 PM 02/26/2023    2:36 PM 02/23/2022    2:40 PM 02/18/2021    9:57 AM 02/05/2020   10:20 AM  6CIT Screen  What Year? 0 points 0 points 0 points 0 points 0 points  What month? 0 points 0 points 0 points 0 points 0 points  What time? 0 points 0 points 0 points 0 points 0 points  Count back from 20 0 points 0 points 0 points 0 points 0 points  Months in reverse 0 points 0 points 0 points 0 points 0 points  Repeat phrase 0 points 0 points 0 points 0 points 0 points  Total Score 0 points 0 points 0 points 0 points 0 points    Immunizations Immunization History  Administered Date(s) Administered   Fluad Quad(high Dose 65+) 08/11/2020, 08/12/2021, 08/14/2022   Fluad Trivalent(High Dose 65+) 08/15/2023   PFIZER(Purple Top)SARS-COV-2 Vaccination 10/15/2019, 11/05/2019   PNEUMOCOCCAL CONJUGATE-20 08/17/2023   Tdap 06/18/2012, 08/17/2023   Zoster Recombinant(Shingrix) 01/04/2023, 03/12/2023    Screening Tests Health Maintenance  Topic Date Due   HEMOGLOBIN A1C  02/12/2024   COVID-19 Vaccine (3 - Pfizer risk series) 05/26/2024 (Originally 12/03/2019)   INFLUENZA VACCINE  04/25/2024   Diabetic kidney evaluation - eGFR measurement  08/14/2024   Diabetic kidney evaluation - Urine ACR  08/14/2024   FOOT EXAM  08/14/2024   OPHTHALMOLOGY EXAM  09/26/2024   Medicare Annual Wellness (AWV)  02/26/2025   DTaP/Tdap/Td (3 - Td or Tdap) 08/16/2033   Pneumonia Vaccine 42+ Years old  Completed   Zoster Vaccines- Shingrix  Completed   HPV VACCINES  Aged Out   Meningococcal B Vaccine  Aged Out    Health Maintenance  Health Maintenance Due  Topic Date Due   HEMOGLOBIN A1C  02/12/2024   Health Maintenance Items Addressed: AGED OUT OF COLONOSCOPY; DECLINES MORE  COVID SHOTS; UP TO DATE ON ALL OTHER SHOTS  Additional Screening:  Vision Screening: Recommended annual ophthalmology exams for early detection of glaucoma and other disorders of the eye. Would you like a referral to an eye doctor? No    Dental Screening: Recommended annual dental exams for proper oral hygiene  Community Resource Referral / Chronic Care Management: CRR required this visit?  No   CCM required this visit?  No   Plan:    I have personally reviewed and noted the following in the patient's chart:   Medical and social history Use of alcohol, tobacco or illicit drugs  Current medications and supplements including opioid prescriptions. Patient is not currently taking opioid prescriptions. Functional ability and status Nutritional status Physical activity Advanced directives List of other physicians Hospitalizations, surgeries, and ER visits in previous 12 months Vitals Screenings to include cognitive, depression, and falls Referrals and appointments  In addition, I have reviewed and discussed with patient certain preventive protocols, quality metrics, and best practice recommendations. A written personalized care plan for preventive services as well as general preventive  health recommendations were provided to patient.   Pinky Bright, LPN   09/30/1094   After Visit Summary: (MyChart) Due to this being a telephonic visit, the after visit summary with patients personalized plan was offered to patient via MyChart   Notes: Nothing significant to report at this time.

## 2024-02-29 ENCOUNTER — Ambulatory Visit: Admitting: Family Medicine

## 2024-02-29 ENCOUNTER — Encounter: Payer: Self-pay | Admitting: Family Medicine

## 2024-02-29 VITALS — BP 135/66 | HR 62 | Resp 16 | Ht 70.0 in | Wt 198.0 lb

## 2024-02-29 DIAGNOSIS — N400 Enlarged prostate without lower urinary tract symptoms: Secondary | ICD-10-CM | POA: Insufficient documentation

## 2024-02-29 DIAGNOSIS — Z7984 Long term (current) use of oral hypoglycemic drugs: Secondary | ICD-10-CM

## 2024-02-29 DIAGNOSIS — E785 Hyperlipidemia, unspecified: Secondary | ICD-10-CM | POA: Diagnosis not present

## 2024-02-29 DIAGNOSIS — N401 Enlarged prostate with lower urinary tract symptoms: Secondary | ICD-10-CM

## 2024-02-29 DIAGNOSIS — E1169 Type 2 diabetes mellitus with other specified complication: Secondary | ICD-10-CM

## 2024-02-29 DIAGNOSIS — I471 Supraventricular tachycardia, unspecified: Secondary | ICD-10-CM

## 2024-02-29 DIAGNOSIS — I10 Essential (primary) hypertension: Secondary | ICD-10-CM | POA: Diagnosis not present

## 2024-02-29 DIAGNOSIS — I700 Atherosclerosis of aorta: Secondary | ICD-10-CM | POA: Diagnosis not present

## 2024-02-29 LAB — POCT GLYCOSYLATED HEMOGLOBIN (HGB A1C)
Hemoglobin A1C: 8 % — AB (ref 4.0–5.6)
PT: 183

## 2024-02-29 MED ORDER — METFORMIN HCL ER 750 MG PO TB24: 750.0000 mg | ORAL_TABLET | Freq: Two times a day (BID) | ORAL | 1 refills | Status: DC

## 2024-02-29 MED ORDER — METFORMIN HCL ER 500 MG PO TB24: 500.0000 mg | ORAL_TABLET | Freq: Every day | ORAL | 1 refills | Status: DC

## 2024-02-29 NOTE — Progress Notes (Signed)
 Established patient visit   Patient: Joseph Cook   DOB: 1941/01/27   83 y.o. Male  MRN: [id number] Visit Date: 02/29/2024  Today's healthcare provider: Carlean Charter, DO   Chief Complaint  Patient presents with   Diabetes    Pt is fasting   Subjective    Diabetes  Joseph Cook "Raenette Bumps" is an 83 year old male with diabetes who presents for a six-month follow-up.  He missed scheduling his usual May appointment and is concerned about his diabetes management, particularly his A1c levels, which he anticipates will be high. His blood glucose readings at home have been over 200 mg/dL. He is currently taking metformin  500 mg, one tablet twice a day, which was increased from metformin  750 mg one tablet once a day during his last visit. He experiences no gastrointestinal side effects from metformin , but his wife is concerned about the medication due to negative stories she has heard.  His medication regimen includes amlodipine  5 mg, Lipitor 10 mg, lisinopril , hydrochlorothiazide , metoprolol , Flomax , baby aspirin, vitamin D, vitamin B12, and Preservision. He also sees a cardiologist for his heart conditions, which include supraventricular tachycardia (SVT), premature ventricular contractions (PVCs), and a heart murmur.   In terms of lifestyle, he does not consume candy but admits to eating ice cream and some carbohydrates occasionally. He is aware of other diabetes medications advertised on TV but has no specific preference for switching medications at this time.      Medications: Outpatient Medications Prior to Visit  Medication Sig   amLODipine  (NORVASC ) 5 MG tablet Take 1 tablet (5 mg total) by mouth daily.   Aspirin 81 MG CAPS Take 81 mg by mouth daily.   atorvastatin  (LIPITOR) 10 MG tablet TAKE 1 TABLET BY MOUTH EVERYDAY AT BEDTIME   Blood Glucose Monitoring Suppl (ONE TOUCH ULTRA 2) w/Device KIT USE TO CHECK BLOOD SUGAR UP TO 4 TIMES DAILY   Cholecalciferol (VITAMIN  D3) 50 MCG (2000 UT) TABS Take 2,000 Units by mouth daily.    cyanocobalamin (VITAMIN B12) 1000 MCG tablet Take 1,000 mcg by mouth daily.   glucose blood (ONETOUCH ULTRA) test strip Use to test blood sugar once a day.   Lancets (ONETOUCH DELICA PLUS LANCET33G) MISC USE TO TEST BLOOD SUGAR AT LEAST ONCE DAILY   lisinopril -hydrochlorothiazide  (ZESTORETIC ) 20-12.5 MG tablet TAKE 2 TABLETS BY MOUTH EVERY DAY   Multiple Vitamins-Minerals (PRESERVISION AREDS 2 PO) Take 1 capsule by mouth 2 (two) times daily.    omeprazole  (PRILOSEC) 40 MG capsule TAKE 1 CAPSULE BY MOUTH EVERY DAY   tamsulosin  (FLOMAX ) 0.4 MG CAPS capsule TAKE 1 CAPSULE BY MOUTH EVERY DAY   [DISCONTINUED] metFORMIN  (GLUCOPHAGE ) 500 MG tablet Take 1 tablet (500 mg total) by mouth 2 (two) times daily with a meal.   metoprolol  succinate (TOPROL -XL) 50 MG 24 hr tablet Take 1.5 tablets (75 mg total) by mouth daily. Take with or immediately following a meal.   No facility-administered medications prior to visit.        Objective    BP 135/66 (BP Location: Left Arm, Patient Position: Sitting, Cuff Size: Large)   Pulse 62   Resp 16   Ht 5\' 10"  (1.778 m)   Wt 198 lb (89.8 kg)   SpO2 97%   BMI 28.41 kg/m     Physical Exam Vitals and nursing note reviewed.  Constitutional:      General: He is not in acute distress.    Appearance: Normal appearance.  HENT:     Head: Normocephalic and atraumatic.  Eyes:     General: No scleral icterus.    Conjunctiva/sclera: Conjunctivae normal.  Cardiovascular:     Rate and Rhythm: Normal rate.  Pulmonary:     Effort: Pulmonary effort is normal.  Neurological:     Mental Status: He is alert and oriented to person, place, and time. Mental status is at baseline.  Psychiatric:        Mood and Affect: Mood normal.        Behavior: Behavior normal.      Results for orders placed or performed in visit on 02/29/24  POCT glycosylated hemoglobin (Hb A1C)  Result Value Ref Range   Hemoglobin  A1C 8.0 (A) 4.0 - 5.6 %   PT 183     Assessment & Plan    Type 2 diabetes mellitus associated with Hyperlipidemia (HCC) -     POCT glycosylated hemoglobin (Hb A1C) -     metFORMIN  HCl ER; Take 1 tablet (750 mg total) by mouth 2 (two) times daily.  Dispense: 180 tablet; Refill: 1  Aortic atherosclerosis (HCC)  PSVT (paroxysmal supraventricular tachycardia) (HCC)  Essential hypertension  Benign prostatic hyperplasia with lower urinary tract symptoms, symptom details unspecified      Type 2 Diabetes Mellitus Blood glucose >200 mg/dL, poor control. HbA1c estimated ~8%. On metformin  500 mg BID. Discussed potential switch to Januvia, Jardiance, or Farxiga. Explained side effects of alternatives. Metformin  not causing kidney damage, diabetes can. Continue current regimen pending HbA1c results. - Order HbA1c test - 8.0 today. - Increase metformin  XR to 750 BID.  Hypertension Chronic, stable.  On amlodipine , lisinopril -hydrochlorothiazide , and metoprolol .  Blood pressure overall well-controlled.  No changes to medication regimen today.  Aortic atherosclerosis Optimize risk factors.  Paroxysmal ventricular tachycardia Managed with metoprolol .  Follows with cardiology.  Defer to specialist management.  Benign Prostatic Hyperplasia On Flomax . No changes in symptoms or management discussed.  General Health Maintenance Taking vitamin D, vitamin B12, Preservision.  Follow-up Missed regular six-month follow-up. Plan to return to regular schedule in November and May. - Schedule follow-up appointment in November.  Return in about 5 months (around 07/31/2024) for DM.      I discussed the assessment and treatment plan with the patient  The patient was provided an opportunity to ask questions and all were answered. The patient agreed with the plan and demonstrated an understanding of the instructions.   The patient was advised to call back or seek an in-person evaluation if the symptoms  worsen or if the condition fails to improve as anticipated.    Carlean Charter, DO  [facility] [phone] (phone) [phone] (fax)  [facility]

## 2024-03-20 ENCOUNTER — Other Ambulatory Visit: Payer: Self-pay | Admitting: Internal Medicine

## 2024-03-20 ENCOUNTER — Other Ambulatory Visit: Payer: Self-pay | Admitting: Physician Assistant

## 2024-03-20 DIAGNOSIS — R09A2 Foreign body sensation, throat: Secondary | ICD-10-CM

## 2024-03-24 ENCOUNTER — Telehealth: Payer: Self-pay | Admitting: Internal Medicine

## 2024-03-24 NOTE — Telephone Encounter (Signed)
*  STAT* If patient is at the pharmacy, call can be transferred to refill team.   1. Which medications need to be refilled? (please list name of each medication and dose if known) metoprolol  succinate (TOPROL -XL) 50 MG 24 hr tablet (Expired)    2. Would you like to learn more about the convenience, safety, & potential cost savings by using the [facility]?    3. Are you open to using the [facility] (Type [facility]. ).   4. Which pharmacy/location (including street and city if local pharmacy) is medication to be sent to?  [id number] [address], [place] - [address]     5. Do they need a 30 day or 90 day supply? 90 day

## 2024-04-28 ENCOUNTER — Encounter: Payer: Self-pay | Admitting: Dermatology

## 2024-04-28 ENCOUNTER — Ambulatory Visit: Payer: 59 | Admitting: Dermatology

## 2024-04-28 DIAGNOSIS — L570 Actinic keratosis: Secondary | ICD-10-CM | POA: Diagnosis not present

## 2024-04-28 DIAGNOSIS — D485 Neoplasm of uncertain behavior of skin: Secondary | ICD-10-CM

## 2024-04-28 DIAGNOSIS — C4491 Basal cell carcinoma of skin, unspecified: Secondary | ICD-10-CM

## 2024-04-28 DIAGNOSIS — C44310 Basal cell carcinoma of skin of unspecified parts of face: Secondary | ICD-10-CM | POA: Diagnosis not present

## 2024-04-28 DIAGNOSIS — L720 Epidermal cyst: Secondary | ICD-10-CM | POA: Diagnosis not present

## 2024-04-28 HISTORY — DX: Basal cell carcinoma of skin, unspecified: C44.91

## 2024-04-28 NOTE — Progress Notes (Signed)
 Follow-Up Visit   Subjective  Joseph Cook is a 83 y.o. male who presents for the following: 6 month follow up to recheck left pretibial. Patient with a few spots at scalp and one at left forearm.   The following portions of the chart were reviewed this encounter and updated as appropriate: medications, allergies, medical history  Review of Systems:  No other skin or systemic complaints except as noted in HPI or Assessment and Plan.  Objective  Well appearing patient in no apparent distress; mood and affect are within normal limits.   A focused examination was performed of the following areas: Legs, scalp, face  Relevant exam findings are noted in the Assessment and Plan.  L mid lateral forearm x 1, L temple x 1, central frontal scalp x 1, vertex scalp x 1, L post parietal scalp x 1, L frontal scalp x 1, R mid helix x 1, R mid anti helix x 1, R sideburn x 1, R jawline x 2, R temple x 1, L infraorbital x 1 (13) Erythematous thin papules/macules with gritty scale.  right superior sideburn 6 mm pink keratotic papule   Assessment & Plan   BCC vs scar vs DF Exam: left pretibial pink irregular macule with slight scale and scar-like features on dermoscopy  Treatment Plan: Stable compared to previous visit. Observation.  Call clinic for new or changing moles.  Recommend daily use of broad spectrum spf 30+ sunscreen to sun-exposed areas.  Bx if growing hurting bleeding  AK (ACTINIC KERATOSIS) (13) L mid lateral forearm x 1, L temple x 1, central frontal scalp x 1, vertex scalp x 1, L post parietal scalp x 1, L frontal scalp x 1, R mid helix x 1, R mid anti helix x 1, R sideburn x 1, R jawline x 2, R temple x 1, L infraorbital x 1 (13) Actinic keratoses are precancerous spots that appear secondary to cumulative UV radiation exposure/sun exposure over time. They are chronic with expected duration over 1 year. A portion of actinic keratoses will progress to squamous cell carcinoma of the  skin. It is not possible to reliably predict which spots will progress to skin cancer and so treatment is recommended to prevent development of skin cancer.  Recommend daily broad spectrum sunscreen SPF 30+ to sun-exposed areas, reapply every 2 hours as needed.  Recommend staying in the shade or wearing long sleeves, sun glasses (UVA+UVB protection) and wide brim hats (4-inch brim around the entire circumference of the hat). Call for new or changing lesions. Destruction of lesion - L mid lateral forearm x 1, L temple x 1, central frontal scalp x 1, vertex scalp x 1, L post parietal scalp x 1, L frontal scalp x 1, R mid helix x 1, R mid anti helix x 1, R sideburn x 1, R jawline x 2, R temple x 1, L infraorbital x 1 (13) Complexity: simple   Destruction method: cryotherapy   Informed consent: discussed and consent obtained   Timeout:  patient name, date of birth, surgical site, and procedure verified Lesion destroyed using liquid nitrogen: Yes   Region frozen until ice ball extended beyond lesion: Yes   Cryo cycles: 1 or 2. Outcome: patient tolerated procedure well with no complications   Post-procedure details: wound care instructions given    NEOPLASM OF UNCERTAIN BEHAVIOR OF SKIN right superior sideburn Skin / nail biopsy Type of biopsy: tangential   Informed consent: discussed and consent obtained   Timeout: patient name,  date of birth, surgical site, and procedure verified   Procedure prep:  Patient was prepped and draped in usual sterile fashion Prep type:  Isopropyl alcohol Anesthesia: the lesion was anesthetized in a standard fashion   Anesthetic:  1% lidocaine  w/ epinephrine 1-100,000 buffered w/ 8.4% NaHCO3 Instrument used: DermaBlade   Hemostasis achieved with: pressure and aluminum chloride   Outcome: patient tolerated procedure well   Post-procedure details: sterile dressing applied and wound care instructions given   Dressing type: bandage and petrolatum    Specimen 1 -  Surgical pathology Differential Diagnosis: r/o SCC  Check Margins: No  Return in about 6 months (around 10/29/2024) for HxBCC, HxAK, HxDN.  Joseph Cook Lonell Drones, RMA, am acting as scribe for Boneta Sharps, MD .   Documentation: I have reviewed the above documentation for accuracy and completeness, and I agree with the above.  Boneta Sharps, MD

## 2024-04-28 NOTE — Patient Instructions (Addendum)

## 2024-05-02 LAB — SURGICAL PATHOLOGY

## 2024-05-05 ENCOUNTER — Encounter: Payer: Self-pay | Admitting: Dermatology

## 2024-05-05 ENCOUNTER — Ambulatory Visit: Payer: Self-pay | Admitting: Dermatology

## 2024-05-05 DIAGNOSIS — C44319 Basal cell carcinoma of skin of other parts of face: Secondary | ICD-10-CM

## 2024-05-05 NOTE — Telephone Encounter (Signed)
-----   Message from [place] sent at 05/05/2024  8:37 AM EDT ----- Diagnosis: right superior sideburn :       BASAL CELL CARCINOMA, SUPERFICIAL AND NODULAR PATTERNS AND EPIDERMAL INCLUSION       CYST   Please call with diagnosis and determine where the patient would like to have Mohs surgery.  Explanation: your biopsy shows a basal cell skin cancer in the second layer of the skin. This is the most common kind of skin cancer and is caused by damage from sun exposure. Basal cell skin cancers  almost never spread beyond the skin, so they are not dangerous to your overall health. However, they will continue to grow, can bleed, cause nonhealing wounds, and disrupt nearby structures unless  fully treated.  Treatment: Given the location and type of skin cancer, I recommend Mohs surgery. Mohs surgery involves cutting out the skin cancer and then checking under the microscope to ensure the whole skin  cancer was removed. If any skin cancer remains, the surgeon will cut out more until it is fully removed. The cure rate is about 98-99%. Once the Mohs surgeon confirms the skin cancer is out, they  will discuss the options to repair or heal the area. You must take it easy for about two weeks after surgery (no lifting over 10-15 lbs, avoid activity to get your heart rate and blood pressure up).  It is done at another office outside of [place] ([place], [place], or [place]). ----- Message ----- From: Interface, Lab In Three Zero One Sent: 05/02/2024   5:41 PM EDT To: Boneta Sharps, MD

## 2024-05-05 NOTE — Telephone Encounter (Signed)
 Discussed biopsy results with patient, patient would like to go for mohs surgery in [facility]- Dr Corey

## 2024-05-19 ENCOUNTER — Encounter: Payer: Self-pay | Admitting: Dermatology

## 2024-05-19 ENCOUNTER — Other Ambulatory Visit: Payer: Self-pay | Admitting: Family Medicine

## 2024-05-22 ENCOUNTER — Encounter: Payer: Self-pay | Admitting: Dermatology

## 2024-05-22 ENCOUNTER — Ambulatory Visit (INDEPENDENT_AMBULATORY_CARE_PROVIDER_SITE_OTHER): Admitting: Dermatology

## 2024-05-22 VITALS — BP 132/76 | HR 63 | Temp 98.2°F

## 2024-05-22 DIAGNOSIS — C44319 Basal cell carcinoma of skin of other parts of face: Secondary | ICD-10-CM | POA: Diagnosis not present

## 2024-05-22 DIAGNOSIS — L579 Skin changes due to chronic exposure to nonionizing radiation, unspecified: Secondary | ICD-10-CM | POA: Diagnosis not present

## 2024-05-22 DIAGNOSIS — L814 Other melanin hyperpigmentation: Secondary | ICD-10-CM | POA: Diagnosis not present

## 2024-05-22 DIAGNOSIS — C4491 Basal cell carcinoma of skin, unspecified: Secondary | ICD-10-CM

## 2024-05-22 MED ORDER — OXYCODONE HCL 5 MG PO TABS: 5.0000 mg | ORAL_TABLET | Freq: Four times a day (QID) | ORAL | 0 refills | Status: DC | PRN

## 2024-05-22 NOTE — Patient Instructions (Signed)

## 2024-05-22 NOTE — Progress Notes (Signed)
 Follow-Up Visit   Subjective  Joseph Cook is a 83 y.o. male who presents for the following: Mohs of Superficial and Nodular Basal Cell Carcinoma on the right superior sideburn, referred by Dr. Claudene.   The following portions of the chart were reviewed this encounter and updated as appropriate: medications, allergies, medical history  Review of Systems:  No other skin or systemic complaints except as noted in HPI or Assessment and Plan.  Objective  Well appearing patient in no apparent distress; mood and affect are within normal limits.  A focused examination was performed of the following areas: Right superior sideburn Relevant physical exam findings are noted in the Assessment and Plan.   right superior sideburn Healing biopsy site   Assessment & Plan   BASAL CELL CARCINOMA (BCC), UNSPECIFIED SITE right superior sideburn Mohs surgery   Anticoagulation: Was the anticoagulation regimen changed prior to Mohs? No    Anesthesia: Anesthesia method: local infiltration Local anesthetic: lidocaine  1% WITH epi  Procedure Details: Timeout: pre-procedure verification complete Procedure Prep: patient was prepped and draped in usual sterile fashion Prep type: chlorhexidine Biopsy accession number: [id number] Pre-Op diagnosis: basal cell carcinoma BCC subtype: nodular and superficial MohsAIQ Surgical site (if tumor spans multiple areas, please select predominant area): cheek (including jawline) Surgery side: right Surgical site (from skin exam): right superior sideburn Pre-operative length (cm): 0.8 Pre-operative width (cm): 0.6 Indications for Mohs surgery: anatomic location where tissue conservation is critical  Micrographic Surgery Details: Post-operative length (cm): 2 Post-operative width (cm): 1.5 Number of Mohs stages: 3 Cumulative additional sections past 5 per stage: 0 Post surgery depth of defect: subcutaneous fat  Stage 1    Tumor features identified on  Mohs section: basal carcinoma    Depth of tumor invasion after stage: dermis  Stage 2    Tumor features identified on Mohs section: basal carcinoma    Depth of tumor invasion after stage: dermis  Stage 3    Tumor features identified on Mohs section: no tumor identified  Reconstruction: Was the defect reconstructed? Yes   Was reconstruction performed by the same Mohs surgeon? Yes   Setting of reconstruction: outpatient office When was reconstruction performed? same day Type of reconstruction: linear Linear reconstruction: complex  Opioids: Did the patient receive a prescription for opioid/narcotic related to Mohs surgery? Yes    Skin repair Complexity:  Complex Final length (cm):  4.5 Informed consent: discussed and consent obtained   Timeout: patient name, date of birth, surgical site, and procedure verified   Procedure prep:  Patient was prepped and draped in usual sterile fashion Prep type:  Chlorhexidine Anesthesia: the lesion was anesthetized in a standard fashion   Anesthetic:  1% lidocaine  w/ epinephrine 1-100,000 buffered w/ 8.4% NaHCO3 Reason for type of repair: reduce tension to allow closure, allow closure of the large defect, preserve normal anatomy and preserve normal anatomical and functional relationships   Undermining: area extensively undermined   Subcutaneous layers (deep stitches):  Suture size:  5-0 Suture type: Monocryl (poliglecaprone 25)   Stitches:  Buried vertical mattress Fine/surface layer approximation (top stitches):  Suture size:  6-0 Suture type: fast-absorbing plain gut   Stitches: simple running   Hemostasis achieved with: suture, pressure and electrodesiccation Outcome: patient tolerated procedure well with no complications   Post-procedure details: sterile dressing applied and wound care instructions given   Dressing type: bandage and pressure dressing    Related Medications oxyCODONE  (OXY IR/ROXICODONE ) 5 MG immediate release  tablet Take 1 tablet (  5 mg total) by mouth every 6 (six) hours as needed for up to 8 doses.   Return in about 4 weeks (around 06/19/2024) for wound check.  LILLETTE Darice Smock, CMA, am acting as scribe for RUFUS CHRISTELLA HOLY, MD.    05/22/2024  HISTORY OF PRESENT ILLNESS  Joseph Cook is seen in consultation at the request of Dr. Claudene for biopsy-proven right superior sideburn. They note that the area has been present for about 6 months increasing in size with time.  There is no history of previous treatment.  Reports no other new or changing lesions and has no other complaints today.  Medications and allergies: see patient chart.  Review of systems: Reviewed 8 systems and notable for the above skin cancer.  All other systems reviewed are unremarkable/negative, unless noted in the HPI. Past medical history, surgical history, family history, social history were also reviewed and are noted in the chart/questionnaire.    PHYSICAL EXAMINATION  General: Well-appearing, in no acute distress, alert and oriented x 4. Vitals reviewed in chart (if available).   Skin: Exam reveals a 0.8 x 0.6 cm erythematous papule and biopsy scar on the right superior sideburn. There are rhytids, telangiectasias, and lentigines, consistent with photodamage.  Biopsy report(s) reviewed, confirming the diagnosis.   ASSESSMENT  1) Superficial and Nodular Basal Cell Carcinoma on the right superior sideburn 2) photodamage 3) solar lentigines   PLAN   1. Due to location, size, histology, or recurrence and the likelihood of subclinical extension as well as the need to conserve normal surrounding tissue, the patient was deemed acceptable for Mohs micrographic surgery (MMS).  The nature and purpose of the procedure, associated benefits and risks including recurrence and scarring, possible complications such as pain, infection, and bleeding, and alternative methods of treatment if appropriate were discussed with the patient  during consent. The lesion location was verified by the patient, by reviewing previous notes, pathology reports, and by photographs as well as angulation measurements if available.  Informed consent was reviewed and signed by the patient, and timeout was performed at 9:00 AM. See op note below.  2. For the photodamage and solar lentigines, sun protection discussed/information given on OTC sunscreens, and we recommend continued regular follow-up with primary dermatologist every 6 months or sooner for any growing, bleeding, or changing lesions. 3. Prognosis and future surveillance discussed. 4. Letter with treatment outcome sent to referring provider. 5. Pain acetaminophen /ibuprofen/oxycodone  5 mg  MOHS MICROGRAPHIC SURGERY AND RECONSTRUCTION  Initial size:   0.8 x 0.6 cm Surgical defect/wound size: 2.0 x 1.5 cm Anesthesia:    0.33% lidocaine  with 1:200,000 epinephrine EBL:    <5 mL Complications:  None Repair type:   Complex SQ suture:   5-0 Monocryl Cutaneous suture:  6-0 Plain gut Final size of the repair: 4.5 cm  Stages: 3  STAGE I: Anesthesia achieved with 0.5% lidocaine  with 1:200,000 epinephrine. ChloraPrep applied. 1 section(s) excised using Mohs technique (this includes total peripheral and deep tissue margin excision and evaluation with frozen sections, excised and interpreted by the same physician). The tumor was first debulked and then excised with an approx. 2 mm margin.  Hemostasis was achieved with electrocautery as needed.  The specimen was then oriented, subdivided/relaxed, inked, and processed using Mohs technique.    Frozen section analysis revealed a positive margin for  multiple, small buds of basaloid cells descending from the epidermis with no dermal invasion in the peripheral margin.    STAGE II: An additional 2 mm  margin was excised.  Hemostasis was achieved with electrocautery as needed.  The specimen was then oriented, subdivided/relaxed, inked, and processed using  Mohs technique.   Frozen section analysis revealed a positive margin for  multiple, small buds of basaloid cells descending from the epidermis with no dermal invasion in the peripheral margin.  STAGE III: An additional 2 mm margin was excised.  Hemostasis was achieved with electrocautery as needed.  The specimen was then oriented, subdivided/relaxed, inked, and processed using Mohs technique. Evaluation of slides by the Mohs surgeon revealed clear tumor margins.   Reconstruction  The surgical wound was then cleaned, prepped, and re-anesthetized as above. Wound edges were undermined extensively along at least one entire edge and at a distance equal to or greater than the width of the defect (see wound defect size above) in order to achieve closure and decrease wound tension and anatomic distortion. Redundant tissue repair including standing cone removal was performed. Hemostasis was achieved with electrocautery. Subcutaneous and epidermal tissues were approximated with the above sutures. The surgical site was then lightly scrubbed with sterile, saline-soaked gauze. The area was then bandaged using Vaseline ointment, non-adherent gauze, gauze pads, and tape to provide an adequate pressure dressing. The patient tolerated the procedure well, was given detailed written and verbal wound care instructions, and was discharged in good condition.   The patient will follow-up: 4 weeks.    Documentation: I have reviewed the above documentation for accuracy and completeness, and I agree with the above.  RUFUS CHRISTELLA HOLY, MD

## 2024-05-28 ENCOUNTER — Encounter: Payer: Self-pay | Admitting: Dermatology

## 2024-06-11 ENCOUNTER — Ambulatory Visit: Admitting: Internal Medicine

## 2024-06-18 ENCOUNTER — Other Ambulatory Visit: Payer: Self-pay | Admitting: Family Medicine

## 2024-06-18 ENCOUNTER — Ambulatory Visit (INDEPENDENT_AMBULATORY_CARE_PROVIDER_SITE_OTHER): Admitting: Dermatology

## 2024-06-18 ENCOUNTER — Encounter: Payer: Self-pay | Admitting: Dermatology

## 2024-06-18 ENCOUNTER — Other Ambulatory Visit: Payer: Self-pay | Admitting: Internal Medicine

## 2024-06-18 VITALS — BP 145/87 | HR 62

## 2024-06-18 DIAGNOSIS — Z85828 Personal history of other malignant neoplasm of skin: Secondary | ICD-10-CM | POA: Diagnosis not present

## 2024-06-18 DIAGNOSIS — C44319 Basal cell carcinoma of skin of other parts of face: Secondary | ICD-10-CM

## 2024-06-18 DIAGNOSIS — L905 Scar conditions and fibrosis of skin: Secondary | ICD-10-CM | POA: Diagnosis not present

## 2024-06-18 DIAGNOSIS — I10 Essential (primary) hypertension: Secondary | ICD-10-CM

## 2024-06-18 NOTE — Progress Notes (Signed)
   Follow Up Visit   Subjective  Joseph Cook is a 83 y.o. male who presents for the following: follow up from Mohs surgery   The patient presents for follow up from Mohs surgery for a BCC on the right superior sideburn, treated on 05/22/24, repaired with linear closure. The patient has been bandaging the wound as directed. The endorse the following concerns: none  The following portions of the chart were reviewed this encounter and updated as appropriate: medications, allergies, medical history  Review of Systems:  No other skin or systemic complaints except as noted in HPI or Assessment and Plan.  Objective  Well appearing patient in no apparent distress; mood and affect are within normal limits.  A focal examination was performed including scalp, head, face.  All findings within normal limits unless otherwise noted below.  Healing wound with mild erythema  Relevant physical exam findings are noted in the Assessment and Plan.    Assessment & Plan    Scar s/p Mohs for [facility] on the right superior sideburn, treated on 05/22/24, repaired with linear closure - Reassured that wound is healing well - Discussed that scars take up to 12 months to mature from the date of surgery - Recommend SPF 30+ to scar daily to prevent purple color from UV exposure during scar maturation process - Discussed that erythema and raised appearance of scar will fade over the next 4-6 months - OK to start scar massage at 4-6 weeks post-op - Can consider silicone based products for scar healing starting at 6 weeks post-op  HISTORY OF BASAL CELL CARCINOMA OF THE SKIN - No evidence of recurrence today - Recommend regular full body skin exams - Recommend daily broad spectrum sunscreen SPF 30+ to sun-exposed areas, reapply every 2 hours as needed.  - Call if any new or changing lesions are noted between office visits  Return if symptoms worsen or fail to improve.  I, Darice Smock, CMA, am acting as scribe for  RUFUS CHRISTELLA HOLY, MD.   Documentation: I have reviewed the above documentation for accuracy and completeness, and I agree with the above.  RUFUS CHRISTELLA HOLY, MD

## 2024-06-18 NOTE — Patient Instructions (Addendum)

## 2024-07-23 ENCOUNTER — Encounter: Payer: Self-pay | Admitting: Internal Medicine

## 2024-07-23 ENCOUNTER — Ambulatory Visit: Attending: Internal Medicine | Admitting: Internal Medicine

## 2024-07-23 VITALS — BP 138/74 | HR 56 | Ht 70.0 in | Wt 195.6 lb

## 2024-07-23 DIAGNOSIS — E1169 Type 2 diabetes mellitus with other specified complication: Secondary | ICD-10-CM | POA: Diagnosis present

## 2024-07-23 DIAGNOSIS — I2510 Atherosclerotic heart disease of native coronary artery without angina pectoris: Secondary | ICD-10-CM | POA: Diagnosis not present

## 2024-07-23 DIAGNOSIS — I471 Supraventricular tachycardia, unspecified: Secondary | ICD-10-CM | POA: Insufficient documentation

## 2024-07-23 DIAGNOSIS — E785 Hyperlipidemia, unspecified: Secondary | ICD-10-CM | POA: Diagnosis present

## 2024-07-23 DIAGNOSIS — I10 Essential (primary) hypertension: Secondary | ICD-10-CM | POA: Diagnosis present

## 2024-07-23 NOTE — Progress Notes (Signed)
 Cardiology Office Note:  .   Date:  07/23/2024  ID:  GRANITE GODMAN, DOB 10-20-1940, MRN [id number] PCP: Donzella Lauraine SAILOR, DO  [facility] Providers Cardiologist:  Lonni Hanson, MD Electrophysiologist:  OLE ONEIDA HOLTS, MD     History of Present Illness: .   Joseph Cook with history of coronary artery calcification, aortic atherosclerosis, PSVT, first-degree AV block, hypertension, hyperlipidemia, and type 2 diabetes mellitus, who presents for follow-up of PSVT and hypertension.  He was last seen in 05/2023, at which time he was feeling well.  Home blood pressures were typically well-controlled.  No medication changes or additional testing were pursued.  Today, [name] reports that he has had intermittent episodes of palpitations with associated lightheadedness.  At times he almost feels like he  is going to blackout, though he has never completely passed out.  Episodes happen on average once a month, though he has had 2 within the last week.  Usually, the palpitations and lightheadedness last for a minute or 2.  He denies chest pain and shortness of breath.  Chronic swelling of the right leg attributed to lymphedema has been stable for the last 20 years.  He notes that his home blood pressures are typically similar to today's reading, usually in the 130s over 70s.  He has not been checking his blood sugar regularly at home because he is discouraged by his elevated readings.  ROS: See HPI  Studies Reviewed: SABRA   EKG Interpretation Date/Time:  Wednesday July 23 2024 13:22:45 EDT Ventricular Rate:  56 PR Interval:  176 QRS Duration:  118 QT Interval:  422 QTC Calculation: 407 R Axis:   -62  Text Interpretation: Sinus bradycardia Left anterior fascicular block Left ventricular hypertrophy with QRS widening ( R in aVL , Cornell product ) Abnormal ECG When compared with ECG of 08-Jun-2023 09:12, Premature atrial complexes are no longer Present PR  interval has decreased Confirmed by Marie Borowski, Lonni [phone]) on 07/23/2024 2:03:25 PM    Risk Assessment/Calculations:             Physical Exam:   VS:  BP 138/74   Pulse (!) 56   Ht 5' 10 (1.778 m)   Wt 195 lb 9.6 oz (88.7 kg)   SpO2 97%   BMI 28.07 kg/m    Wt Readings from Last 3 Encounters:  07/23/24 195 lb 9.6 oz (88.7 kg)  02/29/24 198 lb (89.8 kg)  09/13/23 199 lb 4.8 oz (90.4 kg)    General:  NAD. Neck: No JVD or HJR. Lungs: Clear to auscultation bilaterally without wheezes or crackles. Heart: Regular rate and rhythm with 2/6 systolic murmur. Abdomen: Soft, nontender, nondistended. Extremities: 1+ right and trace LLE edema  ASSESSMENT AND PLAN: .    PSVT: [name] has history of PSVT with multiple episodes noted on event monitor in 2023, lasting up to 4-5 minutes.  He was previously evaluated by Dr. Holts, who did not make any medication changes at that time but suggested addition of amiodarone if symptomatic PSVT remained a concern.  We discussed initiation of amiodarone today, though [name] is somewhat concerned about potential side effects.  We have agreed to have him return to see Dr. Holts to discuss initiation of amiodarone versus other therapies for his continued symptomatic palpitations likely due to PSVT.  Continue current dose of metoprolol  succinate, as further escalation is not possible in the setting of baseline bradycardia and prior first-degree AV block.  Coronary artery calcification and hyperlipidemia associated with type 2 diabetes mellitus: No angina reported.  Continue aspirin and atorvastatin  for target LDL less than 70.  Ongoing management of DM per Dr. Donzella.  Hypertension: Blood pressure mildly elevated today (goal less than 130/80).  [name] is already on max dose of lisinopril -HCTZ, we discussed escalation of amlodipine .  However, given his chronic leg edema, we have agreed to defer this in favor on continued lifestyle modifications.   Continue current dose of metoprolol  75 mg daily, as baseline bradycardia and prior first-degree AV block preclude further escalation.    Dispo: Return to clinic in 1 year.  Signed, Lonni Hanson, MD

## 2024-07-23 NOTE — Patient Instructions (Signed)
 Medication Instructions:  Your physician recommends that you continue on your current medications as directed. Please refer to the Current Medication list given to you today.    *If you need a refill on your cardiac medications before your next appointment, please call your pharmacy*  Lab Work: No labs ordered today    Testing/Procedures: No test ordered today   Follow-Up: At [facility], you and your health needs are our priority.  As part of our continuing mission to provide you with exceptional heart care, our providers are all part of one team.  This team includes your primary Cardiologist (physician) and Advanced Practice Providers or APPs (Physician Assistants and Nurse Practitioners) who all work together to provide you with the care you need, when you need it.  Your next appointment:   1 year(s)  Provider:   You may see Lonni Hanson, MD or one of the following Advanced Practice Providers on your designated Care Team:   Lonni Meager, NP Lesley Maffucci, PA-C Bernardino Bring, PA-C [place], PA-C Tylene Lunch, NP Barnie Hila, NP     Your physician recommends that you schedule a follow-up appointment with Dr. Cindie

## 2024-07-25 ENCOUNTER — Other Ambulatory Visit: Payer: Self-pay | Admitting: Family Medicine

## 2024-07-25 DIAGNOSIS — I10 Essential (primary) hypertension: Secondary | ICD-10-CM

## 2024-07-25 NOTE — Telephone Encounter (Signed)
 LOV- 02/29/2024 NOV- 08/12/2024 LRF- 08/15/2023  Disp Refills Start End    amLODipine  (NORVASC ) 5 MG tablet 90 tablet 3 08/15/2023 --   Sig - Route: Take 1 tablet (5 mg total) by mouth daily. - Oral   Sent to pharmacy as: amLODipine  (NORVASC ) 5 MG tablet   E-Prescribing Status: Receipt confirmed by pharmacy (08/15/2023 10:46 AM EST)

## 2024-08-11 NOTE — Progress Notes (Unsigned)
  Electrophysiology Office Follow up Visit Note:    Date:  08/13/2024   ID:  Joseph Cook, DOB Apr 29, 1941, MRN [id number]  PCP:  Donzella Lauraine SAILOR, DO  [facility] Cardiologist:  Lonni Hanson, MD  [facility] Electrophysiologist:  Joseph ONEIDA HOLTS, MD    Interval History:     Joseph Cook is a 83 y.o. male who presents for a follow up visit.   I last saw the patient Jan 25, 2022 for SVT.  At that time he requested a conservative approach to management and wanted to continue on beta-blockade alone.  He saw Dr. Hanson July 23, 2024.  At that appointment he reported continued intermittent episodes of palpitations and associated lightheadedness.  Today he is doing okay.  He continues to experience approximately 6 episodes of tachycardia per month.  Episodes typically last for seconds at a time. he has not had a very prolonged episode recently.  He has felt presyncopal in the past during SVT episodes.      Past medical, surgical, social and family history were reviewed.  ROS:   Please see the history of present illness.    All other systems reviewed and are negative.  EKGs/Labs/Other Studies Reviewed:    The following studies were reviewed today:  September 2024 EKG reviewed.  PR 206 ms, left anterior fascicular block.        Physical Exam:    VS:  BP (!) 140/70   Pulse 61   Ht 5' 10 (1.778 m)   Wt 198 lb (89.8 kg)   SpO2 98%   BMI 28.41 kg/m     Wt Readings from Last 3 Encounters:  08/13/24 198 lb (89.8 kg)  08/12/24 198 lb 14.4 oz (90.2 kg)  07/23/24 195 lb 9.6 oz (88.7 kg)     GEN: no distress CARD: RRR, No MRG RESP: No IWOB. CTAB.      ASSESSMENT:    1. PSVT (paroxysmal supraventricular tachycardia)   2. Coronary artery calcification   3. Essential hypertension    PLAN:    In order of problems listed above:  #SVT Persists.  Symptomatic.  Class Ic agents are not a good option for him given conduction disease and coronary artery  disease. Continue metoprolol  75 mg by mouth daily I did discuss antiarrhythmic drugs during today's clinic appointment.  I discussed amiodarone in detail including its associated risks and need for close monitoring.  I think before proceeding with amiodarone I would prefer an EP study and ablation.  I discussed this procedure in detail and the possibility to use this to control the arrhythmia and avoid exposure antiarrhythmic drugs and their associated risks.  I think is a good candidate for EP study and ablation.  I discussed my upcoming departure.  I would like to get him in with Dr. Kennyth to discuss procedural details further and get him scheduled.  I will add metoprolol  to tartrate 25 mg by mouth as needed for sustained SVT episodes greater than 30 minutes.  #Hypertension At goal today.  Recommend checking blood pressures 1-2 times per week at home and recording the values.  Recommend bringing these recordings to the primary care physician.  I discussed my upcoming departure from [facility] during today's clinic appointment.  He will follow-up with my partners, Dr. Kennyth.   Signed, Joseph Holts, MD, [facility], [facility] 08/13/2024 8:36 AM    Electrophysiology  [facility]

## 2024-08-12 ENCOUNTER — Encounter: Payer: Self-pay | Admitting: Family Medicine

## 2024-08-12 ENCOUNTER — Ambulatory Visit (INDEPENDENT_AMBULATORY_CARE_PROVIDER_SITE_OTHER): Admitting: Family Medicine

## 2024-08-12 VITALS — BP 145/69 | HR 62 | Temp 97.7°F | Ht 70.0 in | Wt 198.9 lb

## 2024-08-12 DIAGNOSIS — I471 Supraventricular tachycardia, unspecified: Secondary | ICD-10-CM | POA: Diagnosis not present

## 2024-08-12 DIAGNOSIS — R002 Palpitations: Secondary | ICD-10-CM

## 2024-08-12 DIAGNOSIS — K219 Gastro-esophageal reflux disease without esophagitis: Secondary | ICD-10-CM | POA: Diagnosis not present

## 2024-08-12 DIAGNOSIS — Z79899 Other long term (current) drug therapy: Secondary | ICD-10-CM

## 2024-08-12 DIAGNOSIS — E1169 Type 2 diabetes mellitus with other specified complication: Secondary | ICD-10-CM | POA: Diagnosis not present

## 2024-08-12 DIAGNOSIS — Z23 Encounter for immunization: Secondary | ICD-10-CM

## 2024-08-12 DIAGNOSIS — E785 Hyperlipidemia, unspecified: Secondary | ICD-10-CM

## 2024-08-12 NOTE — Assessment & Plan Note (Addendum)
 Recent episodes of palpitations and near syncope that were self-resolving.  Follows with cardiology, Dr. Mady. Will be seeing Dr. Cindie tomorrow to discuss initiation of amiodarone versus other therapies for his continued symptomatic palpitations likely due to PSVT.   - Will check TSH today to rule out chang in level as source of increased palpitations. - Continue metoprolol  succinate 50 mg daily, which cannot be increase due to baseline bradycardia and prior first-degree AV block.

## 2024-08-12 NOTE — Progress Notes (Signed)
 Established patient visit   Patient: Joseph Cook   DOB: 12/31/1940   83 y.o. Male  MRN: [id number] Visit Date: 08/12/2024  Today's healthcare provider: LAURAINE LOISE BUOY, DO   Chief Complaint  Patient presents with   Diabetes    Subjective:   Joseph Cook is a 83 y.o. male who presents for follow up of diabetes.. Patient denies any symptoms.  Reports that he does monitor his glucose at home and states that it is high when he checks it.  Flu Vaccine- yes    Subjective    Diabetes Pertinent negatives for hypoglycemia include no dizziness. Pertinent negatives for diabetes include no chest pain and no weakness.    Joseph Cook is an 83 year old male with diabetes and paroxysmal ventricular tachycardia who presents for evaluation of elevated blood sugars and cardiac symptoms.  He has been experiencing elevated blood sugar levels, with readings over 200 mg/dL in the morning before eating. He does not check his blood sugar daily as it depresses him, but he does so occasionally.  He has a history of paroxysmal ventricular tachycardia and experiences symptoms resembling premature ventricular contractions (PVCs). He has episodes of near syncope that resolve quickly after sitting still. He is under the care of cardiology and will be seeing Dr. Cindie, a cardiac electrophysiologist, tomorrow for further testing/discussion.  He recently returned from a two-week fishing trip and had a tetanus shot on November 10th after a fish hook injury, for which he just completed a course of antibiotics. He is not planning to get a COVID booster but is interested in receiving a flu vaccine.  He checks his blood pressure about three times a week, with readings typically around 130/70 mmHg. He is also on omeprazole  for reflux, taken daily.  He remains active, stating he stays 'on the go' despite sometimes feeling more active than he would like.      Medications: Outpatient  Medications Prior to Visit  Medication Sig   amLODipine  (NORVASC ) 5 MG tablet TAKE 1 TABLET (5 MG TOTAL) BY MOUTH DAILY.   Aspirin 81 MG CAPS Take 81 mg by mouth daily.   atorvastatin  (LIPITOR) 10 MG tablet TAKE 1 TABLET BY MOUTH EVERYDAY AT BEDTIME   Blood Glucose Monitoring Suppl (ONE TOUCH ULTRA 2) w/Device KIT USE TO CHECK BLOOD SUGAR UP TO 4 TIMES DAILY   Cholecalciferol (VITAMIN D3) 50 MCG (2000 UT) TABS Take 2,000 Units by mouth daily.    cyanocobalamin (VITAMIN B12) 1000 MCG tablet Take 1,000 mcg by mouth daily.   glucose blood (ONETOUCH ULTRA) test strip Use to test blood sugar once a day.   Lancets (ONETOUCH DELICA PLUS LANCET33G) MISC USE TO TEST BLOOD SUGAR AT LEAST ONCE DAILY   lisinopril -hydrochlorothiazide  (ZESTORETIC ) 20-12.5 MG tablet TAKE 2 TABLETS BY MOUTH EVERY DAY   metFORMIN  (GLUCOPHAGE -XR) 750 MG 24 hr tablet Take 1 tablet (750 mg total) by mouth 2 (two) times daily.   metoprolol  succinate (TOPROL -XL) 50 MG 24 hr tablet TAKE 1 AND 1/2 TABLETS DAILY (75 MG TOTAL) BY MOUTH WITH FOOD OR IMMEDIATELY AFTER MEALS   Multiple Vitamins-Minerals (PRESERVISION AREDS 2 PO) Take 1 capsule by mouth 2 (two) times daily.    omeprazole  (PRILOSEC) 40 MG capsule TAKE 1 CAPSULE BY MOUTH EVERY DAY   tamsulosin  (FLOMAX ) 0.4 MG CAPS capsule TAKE 1 CAPSULE BY MOUTH EVERY DAY   [DISCONTINUED] oxyCODONE  (OXY IR/ROXICODONE ) 5 MG immediate release tablet Take 1 tablet (5 mg total)  by mouth every 6 (six) hours as needed for up to 8 doses. (Patient not taking: Reported on 07/23/2024)   No facility-administered medications prior to visit.    Review of Systems  Respiratory: Negative.  Negative for cough, shortness of breath and wheezing.   Cardiovascular:  Negative for chest pain, palpitations and leg swelling.  Neurological:  Negative for dizziness and weakness.        Objective    BP (!) 145/69 (BP Location: Left Arm, Patient Position: Sitting, Cuff Size: Normal)   Pulse 62   Temp 97.7 F  (36.5 C) (Oral)   Ht 5' 10 (1.778 m)   Wt 198 lb 14.4 oz (90.2 kg)   SpO2 100%   BMI 28.54 kg/m     Physical Exam Constitutional:      Appearance: Normal appearance.  HENT:     Head: Normocephalic and atraumatic.  Eyes:     General: No scleral icterus.    Extraocular Movements: Extraocular movements intact.     Conjunctiva/sclera: Conjunctivae normal.  Cardiovascular:     Rate and Rhythm: Normal rate and regular rhythm.     Pulses: Normal pulses.     Heart sounds: Murmur heard.  Pulmonary:     Effort: Pulmonary effort is normal. No respiratory distress.     Breath sounds: Normal breath sounds.  Abdominal:     General: Bowel sounds are normal. There is no distension.     Palpations: Abdomen is soft. There is no mass.     Tenderness: There is no abdominal tenderness. There is no guarding.  Musculoskeletal:     Right lower leg: No edema.     Left lower leg: No edema.  Skin:    General: Skin is warm and dry.  Neurological:     Mental Status: He is alert and oriented to person, place, and time. Mental status is at baseline.  Psychiatric:        Mood and Affect: Mood normal.        Behavior: Behavior normal.      No results found for any visits on 08/12/24.  Assessment & Plan    Type 2 diabetes mellitus associated with Hyperlipidemia (HCC) -     Microalbumin / creatinine urine ratio -     Comprehensive metabolic panel with GFR -     Hemoglobin A1c -     Lipid panel  PSVT (paroxysmal supraventricular tachycardia) Assessment & Plan: Recent episodes of palpitations and near syncope that were self-resolving.  Follows with cardiology, Dr. Mady. Will be seeing Dr. Cindie tomorrow to discuss initiation of amiodarone versus other therapies for his continued symptomatic palpitations likely due to PSVT.   - Will check TSH today to rule out chang in level as source of increased palpitations. - Continue metoprolol  succinate 50 mg daily, which cannot be increase due to baseline  bradycardia and prior first-degree AV block.   Palpitations -     TSH Rfx on Abnormal to Free T4  Gastroesophageal reflux disease, unspecified whether esophagitis present -     Vitamin B12  High risk medication use -     Vitamin B12  Need for influenza vaccination -     Flu vaccine HIGH DOSE PF(Fluzone Trivalent)    Type 2 diabetes mellitus associated with hyperlipidemia Elevated blood glucose, non-compliance with monitoring due to depressed mood related to seeing higher glucose numbers. On metformin  750 mg twice daily. - Checked A1c. - Ordered fasting blood work including metabolic panel and lipid panel.  Gastroesophageal reflux disease (GERD), unspecified whether esophagitis present Managed with omeprazole . - Continue omeprazole  daily. - Check vitamin B12 level today due to chronic PPI use  Immunization Received tetanus shot on November 10th, 2025. Declined COVID booster. - Discussed COVID booster availability at pharmacy.    No follow-ups on file.      I discussed the assessment and treatment plan with the patient  The patient was provided an opportunity to ask questions and all were answered. The patient agreed with the plan and demonstrated an understanding of the instructions.   The patient was advised to call back or seek an in-person evaluation if the symptoms worsen or if the condition fails to improve as anticipated.    LAURAINE LOISE BUOY, DO  [facility] [phone] (phone) [phone] (fax)  [facility]

## 2024-08-13 ENCOUNTER — Encounter: Payer: Self-pay | Admitting: Cardiology

## 2024-08-13 ENCOUNTER — Ambulatory Visit: Attending: Cardiology | Admitting: Cardiology

## 2024-08-13 VITALS — BP 140/70 | HR 61 | Ht 70.0 in | Wt 198.0 lb

## 2024-08-13 DIAGNOSIS — I2510 Atherosclerotic heart disease of native coronary artery without angina pectoris: Secondary | ICD-10-CM | POA: Diagnosis present

## 2024-08-13 DIAGNOSIS — I10 Essential (primary) hypertension: Secondary | ICD-10-CM | POA: Insufficient documentation

## 2024-08-13 DIAGNOSIS — I471 Supraventricular tachycardia, unspecified: Secondary | ICD-10-CM | POA: Insufficient documentation

## 2024-08-13 LAB — COMPREHENSIVE METABOLIC PANEL WITH GFR
ALT: 19 IU/L (ref 0–44)
AST: 12 IU/L (ref 0–40)
Albumin: 4.6 g/dL (ref 3.7–4.7)
Alkaline Phosphatase: 50 IU/L (ref 48–129)
BUN/Creatinine Ratio: 40 — ABNORMAL HIGH (ref 10–24)
BUN: 33 mg/dL — ABNORMAL HIGH (ref 8–27)
Bilirubin Total: 0.5 mg/dL (ref 0.0–1.2)
CO2: 22 mmol/L (ref 20–29)
Calcium: 9.9 mg/dL (ref 8.6–10.2)
Chloride: 103 mmol/L (ref 96–106)
Creatinine, Ser: 0.83 mg/dL (ref 0.76–1.27)
Globulin, Total: 2.1 g/dL (ref 1.5–4.5)
Glucose: 167 mg/dL — ABNORMAL HIGH (ref 70–99)
Potassium: 4.5 mmol/L (ref 3.5–5.2)
Sodium: 139 mmol/L (ref 134–144)
Total Protein: 6.7 g/dL (ref 6.0–8.5)
eGFR: 87 mL/min/1.73 (ref >59)

## 2024-08-13 LAB — MICROALBUMIN / CREATININE URINE RATIO
Creatinine, Urine: 82.4 mg/dL
Microalb/Creat Ratio: 10 mg/g{creat} (ref 0–29)
Microalbumin, Urine: 8.4 ug/mL

## 2024-08-13 LAB — TSH RFX ON ABNORMAL TO FREE T4: TSH: 2.42 u[IU]/mL (ref 0.450–4.500)

## 2024-08-13 LAB — LIPID PANEL
Chol/HDL Ratio: 3.3 ratio (ref 0.0–5.0)
Cholesterol, Total: 116 mg/dL (ref 100–199)
HDL: 35 mg/dL — ABNORMAL LOW (ref >39)
LDL Chol Calc (NIH): 54 mg/dL (ref 0–99)
Triglycerides: 159 mg/dL — ABNORMAL HIGH (ref 0–149)
VLDL Cholesterol Cal: 27 mg/dL (ref 5–40)

## 2024-08-13 LAB — HEMOGLOBIN A1C
Est. average glucose Bld gHb Est-mCnc: 169 mg/dL
Hgb A1c MFr Bld: 7.5 % — ABNORMAL HIGH (ref 4.8–5.6)

## 2024-08-13 LAB — VITAMIN B12: Vitamin B-12: 638 pg/mL (ref 232–1245)

## 2024-08-13 MED ORDER — METOPROLOL TARTRATE 25 MG PO TABS: 25.0000 mg | ORAL_TABLET | Freq: Every day | ORAL | 3 refills | Status: AC | PRN

## 2024-08-13 NOTE — Patient Instructions (Signed)
 Medication Instructions:  Your physician has recommended you make the following change in your medication:  1) START taking metoprolol  tartrate (Lopressor ) 25 mg once daily as needed for sustained tachycardia greater than 30 minutes *If you need a refill on your cardiac medications before your next appointment, please call your pharmacy*  Follow-Up: At [facility], you and your health needs are our priority.  As part of our continuing mission to provide you with exceptional heart care, our providers are all part of one team.  This team includes your primary Cardiologist (physician) and Advanced Practice Providers or APPs (Physician Assistants and Nurse Practitioners) who all work together to provide you with the care you need, when you need it.  Your next appointment:     Provider:   Fonda Kitty, MD

## 2024-08-18 ENCOUNTER — Other Ambulatory Visit: Payer: Self-pay | Admitting: Family Medicine

## 2024-08-18 DIAGNOSIS — E7800 Pure hypercholesterolemia, unspecified: Secondary | ICD-10-CM

## 2024-08-18 NOTE — Telephone Encounter (Unsigned)
 Copied from CRM #[id number]. Topic: Clinical - Medication Refill >> Aug 18, 2024 10:46 AM Delon DASEN wrote: Medication: atorvastatin  (LIPITOR) 10 MG tablet  Has the patient contacted their pharmacy? Yes (Agent: If no, request that the patient contact the pharmacy for the refill. If patient does not wish to contact the pharmacy document the reason why and proceed with request.) (Agent: If yes, when and what did the pharmacy advise?)  This is the patient's preferred pharmacy:  [id number] [place], [place] - [address] [place] [address] Phone: [phone] Fax: [phone]  Is this the correct pharmacy for this prescription? Yes If no, delete pharmacy and type the correct one.   Has the prescription been filled recently? Yes  Is the patient out of the medication? No  Has the patient been seen for an appointment in the last year OR does the patient have an upcoming appointment? Yes  Can we respond through MyChart? Yes  Agent: Please be advised that Rx refills may take up to 3 business days. We ask that you follow-up with your pharmacy.

## 2024-08-19 MED ORDER — ATORVASTATIN CALCIUM 10 MG PO TABS: 10.0000 mg | ORAL_TABLET | Freq: Every day | ORAL | 1 refills | Status: AC

## 2024-08-19 NOTE — Telephone Encounter (Signed)
 Requested Prescriptions  Pending Prescriptions Disp Refills   atorvastatin  (LIPITOR) 10 MG tablet 90 tablet 1    Sig: Take 1 tablet (10 mg total) by mouth at bedtime.     Cardiovascular:  Antilipid - Statins Failed - 08/19/2024 12:22 PM      Failed - Lipid Panel in normal range within the last 12 months    Cholesterol, Total  Date Value Ref Range Status  08/12/2024 116 100 - 199 mg/dL Final   LDL Chol Calc (NIH)  Date Value Ref Range Status  08/12/2024 54 0 - 99 mg/dL Final   HDL  Date Value Ref Range Status  08/12/2024 35 (L) >39 mg/dL Final   Triglycerides  Date Value Ref Range Status  08/12/2024 159 (H) 0 - 149 mg/dL Final         Passed - Patient is not pregnant      Passed - Valid encounter within last 12 months    Recent Outpatient Visits           1 week ago Type 2 diabetes mellitus associated with Hyperlipidemia [facility])   [facility] [place], Lauraine SAILOR, DO   5 months ago Type 2 diabetes mellitus associated with Hyperlipidemia [facility])    [facility] [place], Lauraine SAILOR, DO       Future Appointments             In 2 weeks Kennyth Chew, MD [facility] [place]   In 2 months Claudene Lehmann, MD [facility]

## 2024-08-20 ENCOUNTER — Other Ambulatory Visit: Payer: Self-pay | Admitting: Family Medicine

## 2024-08-20 DIAGNOSIS — E1169 Type 2 diabetes mellitus with other specified complication: Secondary | ICD-10-CM

## 2024-08-25 ENCOUNTER — Ambulatory Visit: Payer: Self-pay | Admitting: Family Medicine

## 2024-09-01 NOTE — Progress Notes (Unsigned)
 Electrophysiology Office Note:   Date:  09/01/2024  ID:  Joseph Cook, DOB 09-20-1941, MRN [id number]  Primary Cardiologist: Lonni Hanson, MD Electrophysiologist: Fonda Kitty, MD  {Click to update primary MD,subspecialty MD or APP then REFRESH:1}    History of Present Illness:   Joseph Cook is a 83 y.o. male with h/o coronary artery calcification, aortic atherosclerosis, PSVT, first-degree AV block, hypertension, hyperlipidemia, and type 2 diabetes mellitus who is being seen today for evaluation for EP study and ablation.  Discussed the use of AI scribe software for clinical note transcription with the patient, who gave verbal consent to proceed.  History of Present Illness     Review of systems complete and found to be negative unless listed in HPI.   EP Information / Studies Reviewed:    EKG is not ordered today. EKG from 07/23/24 reviewed which showed sinus bradycardia, LAFB      Nuclear Stress 01/12/22: Pharmacological myocardial perfusion imaging study with no significant  ischemia Normal wall motion, EF estimated at 55% GI uptake artifact noted No EKG changes concerning for ischemia at peak stress or in recovery. CT attenuation correction images with moderate coronary calcification, mild aortic atherosclerosis Low risk scan  TEE 01/06/22:  1. Left ventricular ejection fraction, by estimation, is 60 to 65%. The  left ventricle has normal function. The left ventricle has no regional  wall motion abnormalities.   2. Right ventricular systolic function is normal. The right ventricular  size is normal.   3. The mitral valve is normal in structure. Mild mitral valve  regurgitation. No evidence of mitral stenosis.   4. The aortic valve is tricuspid. There is severe calcification of the  non-coronary cusp of the aortic valve with limited mobility. Aortic valve  regurgitation is not visualized. Aortic valve sclerosis/calcification is  present, without any  evidence of  aortic stenosis. Estimated AVA by planar measurement 2.39 cm sq   5. The inferior vena cava is normal in size with greater than 50%  respiratory variability, suggesting right atrial pressure of 3 mmHg.   6. No left atrial/left atrial appendage thrombus was detected.   7. There is Moderate (Grade III) atheroma plaque involving the aortic  arch and descending aorta.   Risk Assessment/Calculations:   {Does this patient have ATRIAL FIBRILLATION?:[phone]} No BP recorded.  {Refresh Note OR Click here to enter BP  :1}***        Physical Exam:   VS:  There were no vitals taken for this visit.   Wt Readings from Last 3 Encounters:  08/13/24 198 lb (89.8 kg)  08/12/24 198 lb 14.4 oz (90.2 kg)  07/23/24 195 lb 9.6 oz (88.7 kg)     GEN: Well nourished, well developed in no acute distress NECK: No JVD CARDIAC: {EPRHYTHM:28826}, no murmurs, rubs, gallops RESPIRATORY:  Clear to auscultation without rales, wheezing or rhonchi  ABDOMEN: Soft, non-distended EXTREMITIES:  No edema; No deformity   ASSESSMENT AND PLAN:    #SVT Persists.  Symptomatic.  Class Ic agents are not a good option for him given conduction disease and coronary artery disease. Continue metoprolol  75 mg by mouth daily I did discuss antiarrhythmic drugs during today's clinic appointment.  I discussed amiodarone in detail including its associated risks and need for close monitoring.   I think before proceeding with amiodarone I would prefer an EP study and ablation.  I discussed this procedure in detail and the possibility to use this to control the arrhythmia and avoid exposure antiarrhythmic  drugs and their associated risks.  I think is a good candidate for EP study and ablation.  I discussed my upcoming departure.  I would like to get him in with Dr. Kennyth to discuss procedural details further and get him scheduled.   I will add metoprolol  to tartrate 25 mg by mouth as needed for sustained SVT episodes greater  than 30 minutes.   #Hypertension At goal today.  Recommend checking blood pressures 1-2 times per week at home and recording the values.  Recommend bringing these recordings to the primary care physician.  #Coronary artery calcification and hyperlipidemia associated with type 2 diabetes mellitus: No angina reported.  Continue aspirin and atorvastatin  for target LDL less than 70.  Ongoing management of DM per Dr. Donzella.   Assessment & Plan       Follow up with {EPMDS:28135::EP Team} {EPFOLLOW LE:71826}  Signed, Fonda Kennyth, MD

## 2024-09-02 ENCOUNTER — Ambulatory Visit: Attending: Cardiology | Admitting: Cardiology

## 2024-09-02 ENCOUNTER — Ambulatory Visit: Admitting: Cardiology

## 2024-09-02 ENCOUNTER — Other Ambulatory Visit: Payer: Self-pay

## 2024-09-02 VITALS — BP 140/70 | HR 58 | Ht 70.0 in | Wt 198.0 lb

## 2024-09-02 DIAGNOSIS — I10 Essential (primary) hypertension: Secondary | ICD-10-CM

## 2024-09-02 DIAGNOSIS — I471 Supraventricular tachycardia, unspecified: Secondary | ICD-10-CM

## 2024-09-02 DIAGNOSIS — I2510 Atherosclerotic heart disease of native coronary artery without angina pectoris: Secondary | ICD-10-CM | POA: Diagnosis not present

## 2024-09-02 NOTE — Patient Instructions (Signed)
 Medication Instructions:  Your physician recommends that you continue on your current medications as directed. Please refer to the Current Medication list given to you today.  *If you need a refill on your cardiac medications before your next appointment, please call your pharmacy*  Testing/Procedures: Echocardiogram  Your physician has requested that you have an echocardiogram. Echocardiography is a painless test that uses sound waves to create images of your heart. It provides your doctor with information about the size and shape of your heart and how well your heart's chambers and valves are working. This procedure takes approximately one hour. There are no restrictions for this procedure. Please do NOT wear cologne, perfume, aftershave, or lotions (deodorant is allowed). Please arrive 15 minutes prior to your appointment time.  Please note: We ask at that you not bring children with you during ultrasound (echo/ vascular) testing. Due to room size and safety concerns, children are not allowed in the ultrasound rooms during exams. Our front office staff cannot provide observation of children in our lobby area while testing is being conducted. An adult accompanying a patient to their appointment will only be allowed in the ultrasound room at the discretion of the ultrasound technician under special circumstances. We apologize for any inconvenience.  Ablation Your physician has recommended that you have an ablation. Catheter ablation is a medical procedure used to treat some cardiac arrhythmias (irregular heartbeats). During catheter ablation, a long, thin, flexible tube is put into a blood vessel in your groin (upper thigh), or neck. This tube is called an ablation catheter. It is then guided to your heart through the blood vessel. Radio frequency waves destroy small areas of heart tissue where abnormal heartbeats may cause an arrhythmia to start.   You are scheduled for SVT Ablation on Tuesday,  January 27 with Dr. Dr. Kennyth. Please arrive at the Main Entrance A at [facility]: [address] [place], [place] [address] at 9:30 AM   What To Expect:  Labs: you will need to have lab work drawn within 30 days of your procedure. Please go to any [facility] location to have these drawn - no appointment is needed. You will receive procedure instructions either through MyChart or in the mail 4-6 week prior to your procedure.  After your procedure we recommend no driving for 4 days, no lifting over 5 lbs for 7 days, and no work or strenuous activity for 7 days.  Please contact our office at [phone] if you have any questions.    Follow-Up: We will contact you to schedule your post-procedure appointments.

## 2024-09-03 ENCOUNTER — Other Ambulatory Visit: Payer: Self-pay | Admitting: Family Medicine

## 2024-09-03 DIAGNOSIS — E1169 Type 2 diabetes mellitus with other specified complication: Secondary | ICD-10-CM

## 2024-09-10 ENCOUNTER — Other Ambulatory Visit: Payer: Self-pay | Admitting: Internal Medicine

## 2024-09-19 ENCOUNTER — Telehealth: Payer: Self-pay

## 2024-09-19 NOTE — Telephone Encounter (Signed)
-----   Message from Nurse Doreatha BROCKS, RN sent at 09/02/2024 10:20 AM EST ----- Regarding: 1/27 SVT ablation Precert:  MD: Kennyth Type of ablation: SVT Diagnosis: SVT CPT code: SVT/WPW (06346) Ablation scheduled (date/time): 1/27 at 11:30am  Procedure:  Added to calendar? Yes Orders entered? Yes Letter complete? No, >30 days before procedure Scheduled with cath lab? Yes Any medications to hold? Yes (please list hold instructions): metoprolol  for 3 days Labs ordered (CBC, BMET, PT/INR if on warfarin): Yes Mapping system: Doesn't matter CARTO/OPAL rep notified? No Cardiac CT needed? No Dye allergy? No Pre-meds ordered and instructions given? No, not needed Letter method: MyChart H&P: 12/9 Device: No  Follow-up:  Cassie/Angel, please schedule Routine.  Covering RN - please send this message to [employer], EP scheduler, EP Scheduling pool, EP [employer], and CT scheduler (Brittany Lynch/Stephanie Mogg), if indicated.

## 2024-09-29 ENCOUNTER — Ambulatory Visit: Payer: Self-pay

## 2024-09-29 NOTE — Telephone Encounter (Signed)
 FYI Only or Action Required?: FYI only for provider: appointment scheduled on 10/01/2024.  Patient was last seen in primary care on 08/12/2024 by Donzella Lauraine SAILOR, DO.  Called Nurse Triage reporting Nasal Congestion.  Symptoms began several weeks ago.  Triage Disposition: See PCP When Office is Open (Within 3 Days)  Patient/caregiver understands and will follow disposition?: Yes    Reason for Disposition  [1] Sinus congestion (pressure, fullness) AND [2] present > 10 days  Answer Assessment - Initial Assessment Questions This RN scheduled pt an appointment on 1/7.   Congestion x2 weeks Productive cough intermittent In head and chest- clear mucous Has gotten a little better 1/27 heart ablation and doesn't want it to be rescheduled  Denies difficulty breathing, chest pain, fever, earache, had a sore throat  Protocols used: Common Cold-A-AH

## 2024-09-29 NOTE — Telephone Encounter (Signed)
 First attempt, left message with office number.  Copied from CRM [id number]. Topic: Clinical - Medical Advice >> Sep 29, 2024  9:36 AM Nessti S wrote: Reason for CRM: pt called in because he has congestion in head and chest for over a week. He has SVT ablation surgery on Jan 27 and doesn't want to have it rescheduled because he has congestion. He would like to be prescribed meds to clear it up.

## 2024-10-01 ENCOUNTER — Encounter: Payer: Self-pay | Admitting: Family Medicine

## 2024-10-01 ENCOUNTER — Ambulatory Visit (INDEPENDENT_AMBULATORY_CARE_PROVIDER_SITE_OTHER): Admitting: Family Medicine

## 2024-10-01 VITALS — BP 145/61 | HR 58 | Resp 16 | Ht 70.0 in | Wt 198.0 lb

## 2024-10-01 DIAGNOSIS — J069 Acute upper respiratory infection, unspecified: Secondary | ICD-10-CM

## 2024-10-01 NOTE — Patient Instructions (Addendum)
 Please review the attached list of medications and notify my office if there are any errors.   I recommend that you get the RSV vaccine (Arexvy) to protect yourself from certain types of viral pneumonia. This vaccine is typically covered under Medicare prescription plans and should be covered if you get it at your pharmacy.

## 2024-10-01 NOTE — Progress Notes (Signed)
 "     Established patient visit   Patient: Joseph Cook   DOB: 1940-11-08   84 y.o. Male  MRN: [id number] Visit Date: 10/01/2024  Today's healthcare provider: Nancyann Perry, MD   Chief Complaint  Patient presents with   Acute Visit   Nasal Congestion    Congestion ( 2x weeks)    Subjective    Discussed the use of AI scribe software for clinical note transcription with the patient, who gave verbal consent to proceed.  History of Present Illness   Joseph Cook is an 83 year old male who presents with congestion and respiratory symptoms.  He has been experiencing congestion for over two weeks, which initially started with a sore throat that resolved but progressed to chest and head congestion. He needs to blow his nose several times a day and has a persistent cough. His nose feels clogged but is not running.  He feels short of breath, although this symptom is improving. He is concerned about his upcoming heart ablation scheduled for January 27th.  No ear fullness or congestion, although he typically requires ear cleaning twice a year due to wax buildup.  He received a flu shot in November but has not received the RSV vaccine.      Medications: Show/hide medication list[1] Review of Systems  Constitutional:  Negative for appetite change, chills and fever.  Respiratory:  Negative for chest tightness, shortness of breath and wheezing.   Cardiovascular:  Negative for chest pain and palpitations.  Gastrointestinal:  Negative for abdominal pain, nausea and vomiting.       Objective    BP (!) 145/61 (BP Location: Right Arm, Patient Position: Sitting, Cuff Size: Large)   Pulse (!) 58   Resp 16   Ht 5' 10 (1.778 m)   Wt 198 lb (89.8 kg)   SpO2 100%   BMI 28.41 kg/m   Physical Exam   General: Appearance:    Well developed, well nourished male in no acute distress  Eyes:    PERRL, conjunctiva/corneas clear, EOM's intact       ENT:   Nasal turbinates pale  and boggy. No drainage. No post nasal drainage.   Lungs:     Clear to auscultation bilaterally, respirations unlabored  Heart:    Bradycardic. Normal rhythm.  3/6 systolic murmur   MS:   All extremities are intact.    Neurologic:   Awake, alert, oriented x 3. No apparent focal neurological defect.       Assessment & Plan        Acute upper respiratory infection Symptoms resolving, no intervention needed. - Proceed with scheduled heart ablation on January 27th, 2026.  General Health Maintenance Discussed RSV vaccine for adults over 75. - Consider RSV vaccination during RSV season.          Nancyann Perry, MD  [facility] [phone] (phone) [phone] (fax)  [facility]    [1]  Outpatient Medications Prior to Visit  Medication Sig   amLODipine  (NORVASC ) 5 MG tablet TAKE 1 TABLET (5 MG TOTAL) BY MOUTH DAILY.   Aspirin 81 MG CAPS Take 81 mg by mouth daily.   atorvastatin  (LIPITOR) 10 MG tablet Take 1 tablet (10 mg total) by mouth at bedtime.   Blood Glucose Monitoring Suppl (ONE TOUCH ULTRA 2) w/Device KIT USE TO CHECK BLOOD SUGAR UP TO 4 TIMES DAILY   Cholecalciferol (VITAMIN D3) 50 MCG (2000 UT) TABS Take 2,000 Units by  mouth daily.    cyanocobalamin (VITAMIN B12) 1000 MCG tablet Take 1,000 mcg by mouth daily.   glucose blood (ONETOUCH ULTRA) test strip Use to test blood sugar once a day.   Lancets (ONETOUCH DELICA PLUS LANCET33G) MISC USE TO TEST BLOOD SUGAR AT LEAST ONCE DAILY   lisinopril -hydrochlorothiazide  (ZESTORETIC ) 20-12.5 MG tablet TAKE 2 TABLETS BY MOUTH EVERY DAY   metFORMIN  (GLUCOPHAGE -XR) 750 MG 24 hr tablet TAKE 1 TABLET BY MOUTH 2 TIMES DAILY.   metoprolol  succinate (TOPROL -XL) 50 MG 24 hr tablet TAKE 1 AND 1/2 TABLETS DAILY (75 MG TOTAL) BY MOUTH WITH FOOD OR IMMEDIATELY AFTER MEALS   metoprolol  tartrate (LOPRESSOR ) 25 MG tablet Take 1 tablet (25 mg total) by mouth daily as needed. For sustained tachycardia greater  than 30 minutes   Multiple Vitamins-Minerals (PRESERVISION AREDS 2 PO) Take 1 capsule by mouth 2 (two) times daily.    omeprazole  (PRILOSEC) 40 MG capsule TAKE 1 CAPSULE BY MOUTH EVERY DAY   tamsulosin  (FLOMAX ) 0.4 MG CAPS capsule TAKE 1 CAPSULE BY MOUTH EVERY DAY   No facility-administered medications prior to visit.   "

## 2024-10-02 ENCOUNTER — Encounter (HOSPITAL_COMMUNITY): Payer: Self-pay

## 2024-10-02 ENCOUNTER — Telehealth (HOSPITAL_COMMUNITY): Payer: Self-pay

## 2024-10-02 NOTE — Telephone Encounter (Signed)
 Attempted to reach patient to discuss upcoming procedure, no answer. Left VM for patient to return call.

## 2024-10-06 NOTE — Telephone Encounter (Signed)
 Spoke with patient to discuss upcoming procedure.   Labs: to be completed 1/14   Any recent signs of acute illness or been started on antibiotics? Patient reports he had a sore throat with head and chest congestion for a couple of weeks. When seen by PCP on 1/7, his symptoms were resolving and no intervention needed.  Today, he denies any of the above mentioned symptoms. States, I feel good. Any new medications started? No  Any medications to hold?  Yes.  HOLD Metoprolol  for 3 days prior to your procedure. Last dose on Friday, January 23rd.  Medication instructions:  On the morning of your procedure DO NOT take any medication or the procedure may be rescheduled. Nothing to eat or drink after midnight prior to your procedure.  Confirmed patient is scheduled for SVT Ablation on Tuesday, January 27 with Dr. Kennyth. Instructed patient to arrive at the Main Entrance A at [facility]: [address] [place], [place] [address] and check in at Admitting at 9:30 AM.   Plan to go home the same day, you will only stay overnight if medically necessary. You MUST have a responsible adult to drive you home and MUST be with you the first 24 hours after you arrive home or your procedure could be cancelled.  Informed patient a nurse will call a day before the procedure to confirm arrival time and ensure instructions are followed.  Patient verbalized understanding to all instructions provided and agreed to proceed with procedure.   Advised patient to contact RN Navigator at [phone], to inform of any new medications started after call or concerns prior to procedure.

## 2024-10-08 ENCOUNTER — Ambulatory Visit

## 2024-10-08 DIAGNOSIS — I471 Supraventricular tachycardia, unspecified: Secondary | ICD-10-CM | POA: Diagnosis not present

## 2024-10-08 DIAGNOSIS — I10 Essential (primary) hypertension: Secondary | ICD-10-CM | POA: Insufficient documentation

## 2024-10-08 LAB — ECHOCARDIOGRAM COMPLETE
AR max vel: 3.04 cm2
AV Area VTI: 3.08 cm2
AV Area mean vel: 3.08 cm2
AV Mean grad: 5 mmHg
AV Peak grad: 10.1 mmHg
Ao pk vel: 1.59 m/s
Area-P 1/2: 2.48 cm2
S' Lateral: 3.51 cm

## 2024-10-09 LAB — BASIC METABOLIC PANEL WITH GFR
BUN/Creatinine Ratio: 26 — ABNORMAL HIGH (ref 10–24)
BUN: 22 mg/dL (ref 8–27)
CO2: 21 mmol/L (ref 20–29)
Calcium: 9.6 mg/dL (ref 8.6–10.2)
Chloride: 101 mmol/L (ref 96–106)
Creatinine, Ser: 0.84 mg/dL (ref 0.76–1.27)
Glucose: 213 mg/dL — ABNORMAL HIGH (ref 70–99)
Potassium: 4.7 mmol/L (ref 3.5–5.2)
Sodium: 141 mmol/L (ref 134–144)
eGFR: 87 mL/min/1.73

## 2024-10-09 LAB — CBC
Hematocrit: 43.3 % (ref 37.5–51.0)
Hemoglobin: 14.6 g/dL (ref 13.0–17.7)
MCH: 30.2 pg (ref 26.6–33.0)
MCHC: 33.7 g/dL (ref 31.5–35.7)
MCV: 90 fL (ref 79–97)
Platelets: 151 x10E3/uL (ref 150–450)
RBC: 4.83 x10E6/uL (ref 4.14–5.80)
RDW: 13.2 % (ref 11.6–15.4)
WBC: 7.6 x10E3/uL (ref 3.4–10.8)

## 2024-10-12 ENCOUNTER — Ambulatory Visit: Payer: Self-pay | Admitting: Cardiology

## 2024-10-20 NOTE — Pre-Procedure Instructions (Signed)
 Spoke with patient he still plans to come tomorrow.   Told him if anything changes please let us  know as soon as possible.  Instructed patient on the following items: Arrival time 0730 Nothing to eat or drink after midnight No meds AM of procedure Responsible person to drive you home and stay with you for 24 hrs  Metoprolol - last dose 1/23

## 2024-10-21 ENCOUNTER — Ambulatory Visit (HOSPITAL_COMMUNITY)
Admission: RE | Admit: 2024-10-21 | Discharge: 2024-10-21 | Disposition: A | Discharge status: Home / Self Care | Attending: Cardiology | Admitting: Cardiology

## 2024-10-21 ENCOUNTER — Other Ambulatory Visit: Payer: Self-pay

## 2024-10-21 ENCOUNTER — Ambulatory Visit (HOSPITAL_COMMUNITY)

## 2024-10-21 ENCOUNTER — Encounter (HOSPITAL_COMMUNITY)
Admission: RE | Disposition: A | Discharge status: Home / Self Care | Payer: Self-pay | Source: Home / Self Care | Attending: Cardiology

## 2024-10-21 DIAGNOSIS — Z79899 Other long term (current) drug therapy: Secondary | ICD-10-CM | POA: Insufficient documentation

## 2024-10-21 DIAGNOSIS — I4891 Unspecified atrial fibrillation: Secondary | ICD-10-CM

## 2024-10-21 DIAGNOSIS — I2510 Atherosclerotic heart disease of native coronary artery without angina pectoris: Secondary | ICD-10-CM | POA: Diagnosis not present

## 2024-10-21 DIAGNOSIS — E119 Type 2 diabetes mellitus without complications: Secondary | ICD-10-CM | POA: Diagnosis not present

## 2024-10-21 DIAGNOSIS — Z7984 Long term (current) use of oral hypoglycemic drugs: Secondary | ICD-10-CM | POA: Insufficient documentation

## 2024-10-21 DIAGNOSIS — I10 Essential (primary) hypertension: Secondary | ICD-10-CM | POA: Diagnosis not present

## 2024-10-21 DIAGNOSIS — I471 Supraventricular tachycardia, unspecified: Secondary | ICD-10-CM | POA: Insufficient documentation

## 2024-10-21 DIAGNOSIS — K219 Gastro-esophageal reflux disease without esophagitis: Secondary | ICD-10-CM | POA: Insufficient documentation

## 2024-10-21 LAB — GLUCOSE, CAPILLARY
Glucose-Capillary: 189 mg/dL — ABNORMAL HIGH (ref 70–99)
Glucose-Capillary: 145 mg/dL — ABNORMAL HIGH (ref 70–99)

## 2024-10-21 MED ORDER — FENTANYL CITRATE (PF) 100 MCG/2ML IJ SOLN
INTRAMUSCULAR | Status: AC
  Filled 2024-10-21: qty 2

## 2024-10-21 MED ORDER — PROPOFOL 10 MG/ML IV BOLUS
INTRAVENOUS | Status: DC | PRN
  Administered 2024-10-21 (×4): 20 mg via INTRAVENOUS
  Administered 2024-10-21: 40 mg via INTRAVENOUS

## 2024-10-21 MED ORDER — SODIUM CHLORIDE 0.9 % IV SOLN: INTRAVENOUS | Status: DC

## 2024-10-21 MED ORDER — ACETAMINOPHEN 325 MG PO TABS: 650.0000 mg | ORAL_TABLET | ORAL | Status: DC | PRN

## 2024-10-21 MED ORDER — APIXABAN 5 MG PO TABS
5.0000 mg | ORAL_TABLET | Freq: Once | ORAL | Status: AC
  Administered 2024-10-21: 5 mg via ORAL
  Filled 2024-10-21: qty 1

## 2024-10-21 MED ORDER — ISOPROTERENOL HCL 0.2 MG/ML IJ SOLN
INTRAVENOUS | Status: DC | PRN
  Administered 2024-10-21: 2 ug/min via INTRAVENOUS

## 2024-10-21 MED ORDER — FENTANYL CITRATE (PF) 250 MCG/5ML IJ SOLN
INTRAMUSCULAR | Status: DC | PRN
  Administered 2024-10-21: 50 ug via INTRAVENOUS
  Administered 2024-10-21: 25 ug via INTRAVENOUS
  Administered 2024-10-21: 50 ug via INTRAVENOUS
  Administered 2024-10-21: 25 ug via INTRAVENOUS

## 2024-10-21 MED ORDER — ONDANSETRON HCL 4 MG/2ML IJ SOLN: 4.0000 mg | Freq: Four times a day (QID) | INTRAMUSCULAR | Status: DC | PRN

## 2024-10-21 MED ORDER — MIDAZOLAM HCL (PF) 2 MG/2ML IJ SOLN: INTRAMUSCULAR | Status: DC | PRN

## 2024-10-21 MED ORDER — AMIODARONE HCL 150 MG/3ML IV SOLN
INTRAVENOUS | Status: AC
  Filled 2024-10-21: qty 3

## 2024-10-21 MED ORDER — APIXABAN 5 MG PO TABS: 5.0000 mg | ORAL_TABLET | Freq: Two times a day (BID) | ORAL | 3 refills | Status: DC

## 2024-10-21 MED ORDER — MIDAZOLAM HCL (PF) 2 MG/2ML IJ SOLN
INTRAMUSCULAR | Status: DC | PRN
  Administered 2024-10-21 (×2): 1 mg via INTRAVENOUS

## 2024-10-21 MED ORDER — SODIUM CHLORIDE 0.9 % IV SOLN: 250.0000 mL | INTRAVENOUS | Status: DC | PRN

## 2024-10-21 MED ORDER — AMIODARONE IV BOLUS ONLY 150 MG/100ML
INTRAVENOUS | Status: DC | PRN
  Administered 2024-10-21: 150 mg via INTRAVENOUS

## 2024-10-21 MED ORDER — SODIUM CHLORIDE 0.9% FLUSH: 3.0000 mL | INTRAVENOUS | Status: DC | PRN

## 2024-10-21 MED ORDER — ISOPROTERENOL HCL 0.2 MG/ML IJ SOLN
INTRAMUSCULAR | Status: AC
  Filled 2024-10-21: qty 5

## 2024-10-21 MED ORDER — HEPARIN (PORCINE) IN NACL 1000-0.9 UT/500ML-% IV SOLN
INTRAVENOUS | Status: DC | PRN
  Administered 2024-10-21: 500 mL

## 2024-10-21 MED ORDER — SODIUM CHLORIDE 0.9% FLUSH: 3.0000 mL | Freq: Two times a day (BID) | INTRAVENOUS | Status: DC

## 2024-10-21 MED ORDER — ONDANSETRON HCL 4 MG/2ML IJ SOLN
INTRAMUSCULAR | Status: DC | PRN
  Administered 2024-10-21: 4 mg via INTRAVENOUS

## 2024-10-21 MED ORDER — MIDAZOLAM HCL 2 MG/2ML IJ SOLN
INTRAMUSCULAR | Status: AC
  Filled 2024-10-21: qty 2

## 2024-10-21 MED ORDER — PHENYLEPHRINE HCL-NACL 20-0.9 MG/250ML-% IV SOLN
INTRAVENOUS | Status: DC | PRN
  Administered 2024-10-21: 25 ug/min via INTRAVENOUS

## 2024-10-21 MED ORDER — BUPIVACAINE HCL (PF) 0.25 % IJ SOLN
INTRAMUSCULAR | Status: AC
  Filled 2024-10-21: qty 30

## 2024-10-21 NOTE — Transfer of Care (Signed)
 Immediate Anesthesia Transfer of Care Note  Patient: Joseph Cook [facility]  Procedure(s) Performed: SVT ABLATION  Patient Location: PACU and Cath Lab  Anesthesia Type:MAC  Level of Consciousness: awake, alert , and oriented  Airway & Oxygen Therapy: Patient Spontanous Breathing and Patient connected to nasal cannula oxygen  Post-op Assessment: Report given to RN and Post -op Vital signs reviewed and stable  Post vital signs: Reviewed and stable  Last Vitals:  Vitals Value Taken Time  BP 124/83 10/21/24 15:02  Temp    Pulse 69 10/21/24 15:05  Resp 14 10/21/24 15:05  SpO2 95 % 10/21/24 15:05  Vitals shown include unfiled device data.  Last Pain:  Vitals:   10/21/24 0950  TempSrc:   PainSc: 0-No pain         Complications: There were no known notable events for this encounter.

## 2024-10-21 NOTE — Progress Notes (Signed)
 Pt and wife received discharge instructions, teach back performed. Iv removed, no complications. Rt fem site is clean dry intact, site is soft, no signs of bleeding. Pt escorted out via wheelchair to wife's vehicle.

## 2024-10-21 NOTE — Discharge Instructions (Signed)

## 2024-10-21 NOTE — H&P (Signed)
 "  Electrophysiology Note:   Date:  10/21/24  ID:  Joseph Cook, DOB [birth year], MRN [id number]   Primary Cardiologist: Lonni Hanson, MD Electrophysiologist: Fonda Kitty, MD       History of Present Illness:   Joseph Cook is a 84 y.o. male with h/o coronary artery calcification, aortic atherosclerosis, PSVT, first-degree AV block, hypertension, hyperlipidemia, and type 2 diabetes mellitus who is being seen today for evaluation for EP study and ablation.   Discussed the use of AI scribe software for clinical note transcription with the patient, who gave verbal consent to proceed.   History of Present Illness Link Joseph Cook is an 84 year old male who presents with episodes of rapid heart rate. He is accompanied by his wife, Nathanel. He was referred by Dr. Cindie for evaluation of his episodes of rapid heart rate.   He experiences episodes where his heart suddenly accelerates, making him aware that something is amiss. During severe episodes, he feels like he might pass out. He had a couple of minor episodes the night before the visit, which resolved quickly.   The frequency of these episodes varies, occurring about once a week, but sometimes two or three times a day, or he may go a week without any. A previous two-week heart monitor recorded 77 episodes; not all are noticeable to him. The longest recorded episode lasted four minutes, which are the ones he tends to remember.   He has been prescribed metoprolol  to take as needed but has not used it yet. He carries it with him but takes a regular dose daily. Despite this, he continues to experience episodes. He is concerned about the possibility of passing out during an episode, especially since he has felt close to passing out on several occasions, notably while [place] the [place].   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 He and his wife [place] the road, indicating a mobile lifestyle.    Interval: Patient  presents today for EP study +/- ablation. Reports feeling relatively well. Continues to have intermittent palpitations.   Review of systems complete and found to be negative unless listed in HPI.    EP Information / Studies Reviewed:     EKG is not ordered today. EKG from 07/23/24 reviewed which showed sinus bradycardia, LAFB        Nuclear Stress 01/12/22: Pharmacological myocardial perfusion imaging study with no significant  ischemia Normal wall motion, EF estimated at 55% GI uptake artifact noted No EKG changes concerning for ischemia at peak stress or in recovery. CT attenuation correction images with moderate coronary calcification, mild aortic atherosclerosis Low risk scan   TEE 01/06/22:  1. Left ventricular ejection fraction, by estimation, is 60 to 65%. The  left ventricle has normal function. The left ventricle has no regional  wall motion abnormalities.   2. Right ventricular systolic function is normal. The right ventricular  size is normal.   3. The mitral valve is normal in structure. Mild mitral valve  regurgitation. No evidence of mitral stenosis.   4. The aortic valve is tricuspid. There is severe calcification of the  non-coronary cusp of the aortic valve with limited mobility. Aortic valve  regurgitation is not visualized. Aortic valve sclerosis/calcification is  present, without any evidence of  aortic stenosis. Estimated AVA by planar measurement 2.39 cm sq   5. The inferior vena cava is normal in size with greater than 50%  respiratory variability, suggesting right atrial pressure of  3 mmHg.   6. No left atrial/left atrial appendage thrombus was detected.   7. There is Moderate (Grade III) atheroma plaque involving the aortic  arch and descending aorta.          Physical Exam:    Today's Vitals   10/21/24 0930 10/21/24 0950  BP: (!) 159/73   Pulse: 75   Resp: 16   Temp: 98.1 F (36.7 C)   TempSrc: Oral   SpO2: 97%   Weight: 90.7 kg   Height: 5'  10 (1.778 m)   PainSc:  0-No pain   Body mass index is 28.7 kg/m.   General: Well developed, in no acute distress.  Neck: No JVD.  Cardiac: Normal rate, regular rhythm.  Systolic ejection murmur. Resp: Normal work of breathing.  Ext: No edema.  Neuro: No gross focal deficits.  Psych: Normal affect.    ASSESSMENT AND PLAN:     #SVT: Sustained and symptomatic. -Therapeutic strategies for supraventricular tachycardia including medicine and ablation were discussed in detail with the patient today. Risk, benefits, and alternatives to EP study and radiofrequency ablation were also discussed in detail today. These risks include but are not limited to stroke, bleeding, vascular damage, tamponade, perforation, damage to the heart and other structures, AV block requiring pacemaker, worsening renal function, and death. The patient understands these risk and wishes to proceed.  We will therefore proceed with catheter ablation today. -Class Ic agents are not a good option for him given conduction disease and coronary artery disease. -Continue metoprolol  75 mg by mouth daily.  He has additional metoprolol  tartrate to take as needed.     Follow up with EP team in 1 month.    Signed, Fonda Kitty, MD      "

## 2024-10-21 NOTE — Anesthesia Postprocedure Evaluation (Signed)
"   Anesthesia Post Note  Patient: Joseph Cook  Procedure(s) Performed: SVT ABLATION     Patient location during evaluation: PACU Anesthesia Type: MAC Level of consciousness: awake and alert Pain management: pain level controlled Vital Signs Assessment: post-procedure vital signs reviewed and stable Respiratory status: spontaneous breathing, nonlabored ventilation, respiratory function stable and patient connected to nasal cannula oxygen Cardiovascular status: stable and blood pressure returned to baseline Postop Assessment: no apparent nausea or vomiting Anesthetic complications: no   There were no known notable events for this encounter.  Last Vitals:  Vitals:   10/21/24 1615 10/21/24 1630  BP: 119/73 (!) 141/78  Pulse: 77 89  Resp: 14 16  Temp:    SpO2: 95% 93%    Last Pain:  Vitals:   10/21/24 1600  TempSrc:   PainSc: 0-No pain   Pain Goal:                   Rome Ade      "

## 2024-10-21 NOTE — Anesthesia Preprocedure Evaluation (Signed)
"                                    Anesthesia Evaluation  Patient identified by MRN, date of birth, ID band Patient awake    Reviewed: Allergy & Precautions, NPO status , Patient's Chart, lab work & pertinent test results  History of Anesthesia Complications Negative for: history of anesthetic complications  Airway Mallampati: II  TM Distance: >3 FB Neck ROM: Full    Dental  (+) Poor Dentition, Missing   Pulmonary neg pulmonary ROS, neg sleep apnea, neg COPD, Patient abstained from smoking.Not current smoker   Pulmonary exam normal breath sounds clear to auscultation       Cardiovascular Exercise Tolerance: Good METShypertension, Pt. on medications + CAD  (-) Past MI + dysrhythmias Supra Ventricular Tachycardia  Rhythm:Regular Rate:Normal - Systolic murmurs TTE 2026 unremarkable   Neuro/Psych negative neurological ROS  negative psych ROS   GI/Hepatic ,GERD  Controlled and Medicated,,(+)     (-) substance abuse    Endo/Other  diabetes, Well Controlled, Type 2, Oral Hypoglycemic Agents    Renal/GU negative Renal ROS     Musculoskeletal  (+) Arthritis ,    Abdominal   Peds  Hematology   Anesthesia Other Findings Past Medical History: No date: Actinic keratosis No date: Arthritis No date: Basal cell carcinoma 04/28/2024: Basal cell carcinoma     Comment:  right superior sideburn, need Mohs surgery No date: Cataract No date: Diabetes mellitus 08/13/2009: Dysplastic nevus     Comment:  right medial lower leg above medial ankle, malleolus               -severe No date: Glaucoma No date: Heart murmur No date: Hyperlipidemia No date: Hypertension No date: PVC (premature ventricular contraction)  Reproductive/Obstetrics                              Anesthesia Physical Anesthesia Plan  ASA: 2  Anesthesia Plan: MAC   Post-op Pain Management: Minimal or no pain anticipated   Induction: Intravenous  PONV Risk  Score and Plan: 1 and Propofol  infusion, TIVA, Ondansetron  and Treatment may vary due to age or medical condition  Airway Management Planned: Nasal Cannula and Natural Airway  Additional Equipment: None  Intra-op Plan:   Post-operative Plan:   Informed Consent: I have reviewed the patients History and Physical, chart, labs and discussed the procedure including the risks, benefits and alternatives for the proposed anesthesia with the patient or authorized representative who has indicated his/her understanding and acceptance.     Dental advisory given  Plan Discussed with: CRNA and Surgeon  Anesthesia Plan Comments: (Discussed risks of anesthesia with patient, including possibility of difficulty with spontaneous ventilation under anesthesia necessitating airway intervention, PONV, and rare risks such as cardiac or respiratory or neurological events, and allergic reactions. Discussed the role of CRNA in patient's perioperative care. Patient understands.)         Anesthesia Quick Evaluation  "

## 2024-10-22 ENCOUNTER — Encounter (HOSPITAL_COMMUNITY): Payer: Self-pay | Admitting: Cardiology

## 2024-10-22 ENCOUNTER — Other Ambulatory Visit (HOSPITAL_COMMUNITY): Payer: Self-pay

## 2024-10-22 ENCOUNTER — Telehealth: Payer: Self-pay

## 2024-10-22 ENCOUNTER — Telehealth: Payer: Self-pay | Admitting: Pharmacy Technician

## 2024-10-22 DIAGNOSIS — I471 Supraventricular tachycardia, unspecified: Secondary | ICD-10-CM

## 2024-10-22 MED FILL — Bupivacaine HCl Preservative Free (PF) Inj 0.25%: INTRAMUSCULAR | Qty: 30 | Status: AC

## 2024-10-22 MED FILL — Fentanyl Citrate Preservative Free (PF) Inj 100 MCG/2ML: INTRAMUSCULAR | Qty: 3 | Status: AC

## 2024-10-22 NOTE — Telephone Encounter (Signed)
 He has 747.25 deductiblbe. After his deductible, the eliquis  would be 62.43 and xarelto would be 51.86 and dabigatran would be 44.88   He doesn't have cardiomyopathy  Could try for [employer] xarelto assistance. [employer] requires 3% of his income in 2025 to be paid in oop in 2026 before they will provide assistance  Xarelto provider application scanned in media in case he is approved for the switch  Will mail xarelto application tomorrow

## 2024-10-22 NOTE — Telephone Encounter (Signed)
-----   Message from Fonda Kitty, MD sent at 10/21/2024  9:38 PM EST ----- Regarding: Ablation Hi Carly, Can we get this guy scheduled for an AF ablation? Maybe 3/2 or 3/5? I am prescribing Eliquis  today. Will need to make sure he can pay for it and start taking. I would like him to come in to [place] office on Friday to have an ECG and place a Live Zio, if possible. He will wear Live Zio for 2 weeks. Does not need to see me back in clinic prior to ablation.   Just let me know if you have questions.   Thanks, [employer]

## 2024-10-22 NOTE — Telephone Encounter (Signed)
 Pt requesting c/b regarding cost of Eliquis . Pt states that cost is $649.00 which he is unable to afford. Please advise

## 2024-10-22 NOTE — Telephone Encounter (Signed)
 Provider portion is scanned in media if he is approved for change for signature of provider

## 2024-10-22 NOTE — Telephone Encounter (Signed)
 Left message for patient to call back

## 2024-10-22 NOTE — Telephone Encounter (Signed)
 Pt returning nurse call

## 2024-10-22 NOTE — Telephone Encounter (Signed)
 Spoke with the patient and advised on recommendations from Dr. Kennyth. Ablation has been scheduled for 3/2. EKG has been scheduled for 1/30. I will reach out to the [place] team to see if they are also able to place a live zio at that time. The patient is going to pick up his Eliquis  today and he will let us  know if he has any concerns with the cost.

## 2024-10-23 ENCOUNTER — Other Ambulatory Visit: Payer: Self-pay

## 2024-10-23 MED ORDER — APIXABAN 5 MG PO TABS
5.0000 mg | ORAL_TABLET | Freq: Two times a day (BID) | ORAL | 11 refills | Status: AC
  Filled 2024-10-23: qty 60, 30d supply, fill #0

## 2024-10-23 NOTE — Addendum Note (Signed)
 Addended by: MAGDALINE NEEDLE on: 10/23/2024 09:23 AM   Modules accepted: Orders

## 2024-10-23 NOTE — Telephone Encounter (Signed)
 Spoke with the patient and reviewed his options. He is okay with staying with Eliquis , he is going to talk with his insurance company about a payment plan. I advised that I could also send his prescription to a [place] pharmacy and they could provide samples or he could pick some up from the [place] office if they have any available. He is agreeable to this plan.

## 2024-10-23 NOTE — Telephone Encounter (Signed)
 SABRA

## 2024-10-24 ENCOUNTER — Telehealth: Payer: Self-pay | Admitting: Internal Medicine

## 2024-10-24 ENCOUNTER — Telehealth: Payer: Self-pay

## 2024-10-24 ENCOUNTER — Ambulatory Visit: Attending: Internal Medicine

## 2024-10-24 ENCOUNTER — Ambulatory Visit

## 2024-10-24 VITALS — BP 142/68 | HR 83 | Ht 70.0 in | Wt 196.6 lb

## 2024-10-24 DIAGNOSIS — I10 Essential (primary) hypertension: Secondary | ICD-10-CM | POA: Insufficient documentation

## 2024-10-24 DIAGNOSIS — I480 Paroxysmal atrial fibrillation: Secondary | ICD-10-CM | POA: Diagnosis present

## 2024-10-24 MED ORDER — APIXABAN 5 MG PO TABS: 5.0000 mg | ORAL_TABLET | Freq: Two times a day (BID) | ORAL | Status: AC

## 2024-10-24 NOTE — Progress Notes (Unsigned)
" ° °  Nurse Visit   Date of Encounter: 10/24/2024 ID: Joseph Cook, DOB 22-May-1941, MRN [id number]  PCP:  Donzella Lauraine SAILOR, DO   [facility] Providers Cardiologist:  Lonni Hanson, MD Electrophysiologist:  Fonda Kitty, MD { Click to update primary MD,subspecialty MD or APP then REFRESH:1}     Visit Details   VS:  BP (!) 142/68 (BP Location: Left Arm, Patient Position: Sitting, Cuff Size: Normal)   Ht 5' 10 (1.778 m)   Wt 196 lb 9.6 oz (89.2 kg)   BMI 28.21 kg/m  , BMI Body mass index is 28.21 kg/m.  Wt Readings from Last 3 Encounters:  10/24/24 196 lb 9.6 oz (89.2 kg)  10/21/24 200 lb (90.7 kg)  10/01/24 198 lb (89.8 kg)     Reason for visit: EKG and Zio Monitor Performed today: Vitals, EKG, Provider consulted:Agbor-Etang, and Education Changes (medications, testing, etc.) : live monitor place for 14 day Length of Visit: 30 minutes    Medications Adjustments/Labs and Tests Ordered: Orders Placed This Encounter  Procedures   LONG TERM MONITOR-LIVE TELEMETRY (3-14 DAYS)   EKG 12-Lead   No orders of the defined types were placed in this encounter.    Bonney Jon JONETTA Maudine, RN  10/24/2024 10:49 AM  "

## 2024-10-24 NOTE — Telephone Encounter (Signed)
 Brianna with [employer] calling to report auto triggered event of A-fib at 11:12:42 , 73 - 102 bpm, average HR 89, lasting 90 seconds

## 2024-10-24 NOTE — Telephone Encounter (Signed)
 Patient already started on apixaban ; will defer ongoing management to Dr. Kennyth.  Lonni Hanson, MD [facility]

## 2024-10-24 NOTE — Telephone Encounter (Signed)
 Caller Joseph Cook) is reporting abnormal results.

## 2024-10-24 NOTE — Telephone Encounter (Signed)
-----   Message from Nurse Doreatha BROCKS, RN sent at 10/22/2024 10:56 AM EST ----- Regarding: 3/2 afib ablation  Precert:  MD: Kennyth Type of ablation: A-fib Diagnosis: afib CPT code: A-fib (06343) Ablation scheduled (date/time): 3/2 at 7:30am  Procedure:  Added to calendar? Yes Orders entered? Yes Letter complete? No, >30 days before procedure Scheduled with cath lab? Yes Any medications to hold? No Labs ordered (CBC, BMET, PT/INR if on warfarin): Yes Mapping system: Doesn't matter CARTO/OPAL rep notified? No Cardiac CT needed? No Dye allergy? No Pre-meds ordered and instructions given? No, not needed Letter method: MyChart H&P:  Device: No  Follow-up:  Cassie/Angel, please schedule Routine.  Covering RN - please send this message to [employer], EP scheduler, EP Scheduling pool, EP [employer], and CT scheduler (Brittany Lynch/Stephanie Mogg), if indicated.

## 2024-10-30 ENCOUNTER — Ambulatory Visit: Admitting: Dermatology

## 2024-10-30 ENCOUNTER — Encounter: Payer: Self-pay | Admitting: Dermatology

## 2024-10-30 DIAGNOSIS — L814 Other melanin hyperpigmentation: Secondary | ICD-10-CM

## 2024-10-30 DIAGNOSIS — L570 Actinic keratosis: Secondary | ICD-10-CM

## 2024-10-30 DIAGNOSIS — D229 Melanocytic nevi, unspecified: Secondary | ICD-10-CM

## 2024-10-30 DIAGNOSIS — D492 Neoplasm of unspecified behavior of bone, soft tissue, and skin: Secondary | ICD-10-CM

## 2024-10-30 DIAGNOSIS — D1801 Hemangioma of skin and subcutaneous tissue: Secondary | ICD-10-CM

## 2024-10-30 DIAGNOSIS — L578 Other skin changes due to chronic exposure to nonionizing radiation: Secondary | ICD-10-CM

## 2024-10-30 DIAGNOSIS — L821 Other seborrheic keratosis: Secondary | ICD-10-CM

## 2024-10-30 NOTE — Patient Instructions (Addendum)
 Cryotherapy Aftercare  Wash gently with soap and water everyday.   Apply Vaseline and Band-Aid daily until healed.   Recommend daily broad spectrum sunscreen SPF 30+ to sun-exposed areas, reapply every 2 hours as needed. Call for new or changing lesions.  Staying in the shade or wearing long sleeves, sun glasses (UVA+UVB protection) and wide brim hats (4-inch brim around the entire circumference of the hat) are also recommended for sun protection.    Wound Care Instructions  Cleanse wound gently with soap and water once a day then pat dry with clean gauze. Apply a thin coat of Petrolatum (petroleum jelly, Vaseline) over the wound (unless you have an allergy to this). We recommend that you use a new, sterile tube of Vaseline. Do not pick or remove scabs. Do not remove the yellow or white healing tissue from the base of the wound.  Cover the wound with fresh, clean, nonstick gauze and secure with paper tape. You may use Band-Aids in place of gauze and tape if the wound is small enough, but would recommend trimming much of the tape off as there is often too much. Sometimes Band-Aids can irritate the skin.  You should call the office for your biopsy report after 1 week if you have not already been contacted.  If you experience any problems, such as abnormal amounts of bleeding, swelling, significant bruising, significant pain, or evidence of infection, please call the office immediately.  FOR ADULT SURGERY PATIENTS: If you need something for pain relief you may take 1 extra strength Tylenol (acetaminophen) AND 2 Ibuprofen (200mg  each) together every 4 hours as needed for pain. (do not take these if you are allergic to them or if you have a reason you should not take them.) Typically, you may only need pain medication for 1 to 3 days.     Due to recent changes in healthcare laws, you may see results of your pathology and/or laboratory studies on MyChart before the doctors have had a chance to  review them. We understand that in some cases there may be results that are confusing or concerning to you. Please understand that not all results are received at the same time and often the doctors may need to interpret multiple results in order to provide you with the best plan of care or course of treatment. Therefore, we ask that you please give us  2 business days to thoroughly review all your results before contacting the office for clarification. Should we see a critical lab result, you will be contacted sooner.   If You Need Anything After Your Visit  If you have any questions or concerns for your doctor, please call our main line at [phone] and press option 4 to reach your doctor's medical assistant. If no one answers, please leave a voicemail as directed and we will return your call as soon as possible. Messages left after 4 pm will be answered the following business day.   You may also send us  a message via MyChart. We typically respond to MyChart messages within 1-2 business days.  For prescription refills, please ask your pharmacy to contact our office. Our fax number is [phone].  If you have an urgent issue when the clinic is closed that cannot wait until the next business day, you can page your doctor at the number below.    Please note that while we do our best to be available for urgent issues outside of office hours, we are not available 24/7.   If  you have an urgent issue and are unable to reach us , you may choose to seek medical care at your doctor's office, retail clinic, urgent care center, or emergency room.  If you have a medical emergency, please immediately call 911 or go to the emergency department.  Pager Numbers  - Dr. Hester: [phone]  - Dr. Jackquline: [phone]  - Dr. Claudene: [phone]   - Dr. Raymund: [phone]  In the event of inclement weather, please call our main line at [phone] for an update on the status of any delays or  closures.  Dermatology Medication Tips: Please keep the boxes that topical medications come in in order to help keep track of the instructions about where and how to use these. Pharmacies typically print the medication instructions only on the boxes and not directly on the medication tubes.   If your medication is too expensive, please contact our office at [phone] option 4 or send us  a message through MyChart.   We are unable to tell what your co-pay for medications will be in advance as this is different depending on your insurance coverage. However, we may be able to find a substitute medication at lower cost or fill out paperwork to get insurance to cover a needed medication.   If a prior authorization is required to get your medication covered by your insurance company, please allow us  1-2 business days to complete this process.  Drug prices often vary depending on where the prescription is filled and some pharmacies may offer cheaper prices.  The website www.goodrx.com contains coupons for medications through different pharmacies. The prices here do not account for what the cost may be with help from insurance (it may be cheaper with your insurance), but the website can give you the price if you did not use any insurance.  - You can print the associated coupon and take it with your prescription to the pharmacy.  - You may also stop by our office during regular business hours and pick up a GoodRx coupon card.  - If you need your prescription sent electronically to a different pharmacy, notify our office through [facility] or by phone at [phone] option 4.     Si Usted Necesita Algo Despus de Su Visita  Tambin puede enviarnos un mensaje a travs de Clinical Cytogeneticist. Por lo general respondemos a los mensajes de MyChart en el transcurso de 1 a 2 das hbiles.  Para renovar recetas, por favor pida a su farmacia que se ponga en contacto con nuestra oficina. Randi lakes de fax  es [place] [phone].  Si tiene un asunto urgente cuando la clnica est cerrada y que no puede esperar hasta el siguiente da hbil, puede llamar/localizar a su doctor(a) al nmero que aparece a continuacin.   Por favor, tenga en cuenta que aunque hacemos todo lo posible para estar disponibles para asuntos urgentes fuera del horario de [place], no estamos disponibles las 24 horas del da, los 7 [address]  [address] de la [employer].   Si tiene un problema urgente y no puede comunicarse con nosotros, puede optar por buscar atencin mdica  en el consultorio de su doctor(a), en una clnica privada, en un centro de atencin urgente o en una sala de emergencias.  Si tiene engineer, drilling, por favor llame inmediatamente al 911 o vaya a la sala de emergencias.  Nmeros de bper  - Dr. Hester: [phone]  - Dra. Jackquline: [phone]  - Dr. Claudene: [phone]  - Dra. Kitts: [phone]  En caso de  inclemencias del [place], por favor llame a nuestra lnea principal al [phone] para una actualizacin sobre el [place] de cualquier retraso o cierre.  Consejos para la medicacin en dermatologa: Por favor, guarde las cajas en las que vienen los medicamentos de uso tpico para ayudarle a seguir las instrucciones sobre dnde y cmo usarlos. Las farmacias generalmente imprimen las instrucciones del medicamento slo en las cajas y no directamente en los tubos del [place].   Si su medicamento es muy caro, por favor, pngase en contacto con landry rieger llamando al [phone] y presione la opcin 4 o envenos un mensaje a travs de Clinical Cytogeneticist.   No podemos decirle cul ser su copago por los medicamentos por adelantado ya que esto es diferente dependiendo de la cobertura de su seguro. Sin embargo, es posible que podamos encontrar un medicamento sustituto a audiological scientist un formulario para que el seguro cubra el medicamento que se considera necesario.   Si se requiere una autorizacin previa para  que su compaa de seguros cubra su medicamento, por favor permtanos de 1 a 2 das hbiles para completar este proceso.  Los precios de los medicamentos varan con frecuencia dependiendo del environmental consultant de dnde se surte la receta y alguna farmacias pueden ofrecer precios ms baratos.  El sitio web www.goodrx.com tiene cupones para medicamentos de health and safety inspector. Los precios aqu no tienen en cuenta lo que podra costar con la ayuda del seguro (puede ser ms barato con su seguro), pero el sitio web puede darle el precio si no utiliz tourist information centre manager.  - Puede imprimir el cupn correspondiente y llevarlo con su receta a la farmacia.  - Tambin puede pasar por nuestra oficina durante el horario de atencin regular y education officer, museum una tarjeta de cupones de GoodRx.  - Si necesita que su receta se enve electrnicamente a una farmacia diferente, informe a nuestra oficina a travs de MyChart de [place] o por telfono llamando al [phone] y presione la opcin 4.

## 2024-10-30 NOTE — Progress Notes (Signed)
 "  Follow-Up Visit   Subjective  Joseph Cook is a 84 y.o. male who presents for the following: Skin Cancer Screening and Full Body Skin Exam hx of BCC, Dysplastic Nevus, Aks, check spots R elbow, scalp, itchy, check brown spot L temple pt would like removed  The patient presents for Total-Body Skin Exam (TBSE) for skin cancer screening and mole check. The patient has spots, moles and lesions to be evaluated, some may be new or changing and the patient may have concern these could be cancer.    The following portions of the chart were reviewed this encounter and updated as appropriate: medications, allergies, medical history  Review of Systems:  No other skin or systemic complaints except as noted in HPI or Assessment and Plan.  Objective  Well appearing patient in no apparent distress; mood and affect are within normal limits.  A full examination was performed including scalp, head, eyes, ears, nose, lips, neck, chest, axillae, abdomen, back, buttocks, bilateral upper extremities, bilateral lower extremities, hands, feet, fingers, toes, fingernails, and toenails. All findings within normal limits unless otherwise noted below.   Relevant physical exam findings are noted in the Assessment and Plan.  L temple x 1 Residual stuck on waxy pap Scalp x 14, R temple x 2, R preauricular x 1, nasal dorsum x 1, L forehead x 1, L preauricular  1, L jawline x 1, L medial forearm x 2, R extensor elbow x 4 (27) Pink scaly macules R ant mid shin 1.1cm hyperkeratotic pink plaque   Assessment & Plan   SKIN CANCER SCREENING PERFORMED TODAY.  ACTINIC DAMAGE - Chronic condition, secondary to cumulative UV/sun exposure - diffuse scaly erythematous macules with underlying dyspigmentation - Recommend daily broad spectrum sunscreen SPF 30+ to sun-exposed areas, reapply every 2 hours as needed.  - Staying in the shade or wearing long sleeves, sun glasses (UVA+UVB protection) and wide brim hats (4-inch  brim around the entire circumference of the hat) are also recommended for sun protection.  - Call for new or changing lesions.  LENTIGINES, SEBORRHEIC KERATOSES, HEMANGIOMAS - Benign normal skin lesions - Benign-appearing - Call for any changes  MELANOCYTIC NEVI - Tan-brown and/or pink-flesh-colored symmetric macules and papules - Benign appearing on exam today - Observation - Call clinic for new or changing moles - Recommend daily use of broad spectrum spf 30+ sunscreen to sun-exposed areas.   HISTORY OF BASAL CELL CARCINOMA OF THE SKIN - No evidence of recurrence today - Recommend regular full body skin exams - Recommend daily broad spectrum sunscreen SPF 30+ to sun-exposed areas, reapply every 2 hours as needed.  - Call if any new or changing lesions are noted between office visits  -R sup sideburn  HISTORY OF DYSPLASTIC NEVUS No evidence of recurrence today Recommend regular full body skin exams Recommend daily broad spectrum sunscreen SPF 30+ to sun-exposed areas, reapply every 2 hours as needed.  Call if any new or changing lesions are noted between office visits  - R medial lower leg above medial ankle, malleolus  DDX BCC L pretibial Exam: 0.9cm pink scar like macule, stable  Treatment Plan: Recheck on f/u, plan bx if indicated  ACTINIC KERATOSIS Dorsal hands, forearms Exam: Erythematous thin papules/macules with gritty scale at the dorsal hands, forearms  Actinic keratoses are precancerous spots that appear secondary to cumulative UV radiation exposure/sun exposure over time. They are chronic with expected duration over 1 year. A portion of actinic keratoses will progress to squamous cell carcinoma  of the skin. It is not possible to reliably predict which spots will progress to skin cancer and so treatment is recommended to prevent development of skin cancer.  Recommend daily broad spectrum sunscreen SPF 30+ to sun-exposed areas, reapply every 2 hours as needed.   Recommend staying in the shade or wearing long sleeves, sun glasses (UVA+UVB protection) and wide brim hats (4-inch brim around the entire circumference of the hat). Call for new or changing lesions.  Treatment Plan: Plan LN2 on f/u  SEBORRHEIC KERATOSIS L temple x 1 No charge today, free touch up from initial cosmetic treatment 10/29/2023  - Destruction of lesion - L temple x 1 Complexity: simple   Destruction method: cryotherapy   Informed consent: discussed and consent obtained   Timeout:  patient name, date of birth, surgical site, and procedure verified Lesion destroyed using liquid nitrogen: Yes   Region frozen until ice ball extended beyond lesion: Yes   Cryo cycles: 1 or 2. Outcome: patient tolerated procedure well with no complications   Post-procedure details: wound care instructions given    AK (ACTINIC KERATOSIS) (27) Scalp x 14, R temple x 2, R preauricular x 1, nasal dorsum x 1, L forehead x 1, L preauricular  1, L jawline x 1, L medial forearm x 2, R extensor elbow x 4 (27) Recheck L medial forearm x 2 and R extensor forearm x 4  Actinic keratoses are precancerous spots that appear secondary to cumulative UV radiation exposure/sun exposure over time. They are chronic with expected duration over 1 year. A portion of actinic keratoses will progress to squamous cell carcinoma of the skin. It is not possible to reliably predict which spots will progress to skin cancer and so treatment is recommended to prevent development of skin cancer.  Recommend daily broad spectrum sunscreen SPF 30+ to sun-exposed areas, reapply every 2 hours as needed.  Recommend staying in the shade or wearing long sleeves, sun glasses (UVA+UVB protection) and wide brim hats (4-inch brim around the entire circumference of the hat). Call for new or changing lesions. - Destruction of lesion - Scalp x 14, R temple x 2, R preauricular x 1, nasal dorsum x 1, L forehead x 1, L preauricular  1, L jawline x  1, L medial forearm x 2, R extensor elbow x 4 (27) Complexity: simple   Destruction method: cryotherapy   Informed consent: discussed and consent obtained   Timeout:  patient name, date of birth, surgical site, and procedure verified Lesion destroyed using liquid nitrogen: Yes   Region frozen until ice ball extended beyond lesion: Yes   Cryo cycles: 1 or 2. Outcome: patient tolerated procedure well with no complications   Post-procedure details: wound care instructions given    NEOPLASM OF SKIN R ant mid shin - Epidermal / dermal shaving  Lesion diameter (cm):  1.1 Informed consent: discussed and consent obtained   Timeout: patient name, date of birth, surgical site, and procedure verified   Procedure prep:  Patient was prepped and draped in usual sterile fashion Prep type:  Isopropyl alcohol Anesthesia: the lesion was anesthetized in a standard fashion   Anesthetic:  1% lidocaine  w/ epinephrine 1-100,000 buffered w/ 8.4% NaHCO3 Instrument used: DermaBlade   Hemostasis achieved with: pressure and aluminum chloride   Outcome: patient tolerated procedure well   Post-procedure details: wound care instructions given    Specimen 1 - Surgical pathology Differential Diagnosis: R/O SCC  Check Margins: No 1.1cm hyperkeratotic pink plaque LENTIGINES   MULTIPLE  BENIGN NEVI   SEBORRHEIC KERATOSES   ACTINIC ELASTOSIS   CHERRY ANGIOMA   Return in 6 months (on 04/29/2025) for Recheck AKs L medial forearm x 2 and R extensor forearm x 4, AK f/u, Treat AKs hands/forearms.  I, Grayce Saunas, RMA, am acting as scribe for Boneta Sharps, MD .   Documentation: I have reviewed the above documentation for accuracy and completeness, and I agree with the above.  Boneta Sharps, MD    "

## 2024-10-31 LAB — SURGICAL PATHOLOGY

## 2024-11-21 ENCOUNTER — Ambulatory Visit: Admitting: Cardiology

## 2024-11-24 ENCOUNTER — Encounter (HOSPITAL_COMMUNITY): Payer: Self-pay

## 2024-11-24 ENCOUNTER — Ambulatory Visit (HOSPITAL_COMMUNITY): Admit: 2024-11-24 | Admitting: Cardiology

## 2025-03-10 ENCOUNTER — Ambulatory Visit

## 2025-04-30 ENCOUNTER — Ambulatory Visit: Admitting: Dermatology
